# Patient Record
Sex: Male | Born: 1945 | Race: Black or African American | Hispanic: No | Marital: Married | Smoking: Never smoker
Health system: Southern US, Community
[De-identification: ages and names within clinical notes are randomized; demographics above are authoritative.]

## PROBLEM LIST (undated history)

## (undated) DIAGNOSIS — I639 Cerebral infarction, unspecified: Secondary | ICD-10-CM

## (undated) DIAGNOSIS — H04123 Dry eye syndrome of bilateral lacrimal glands: Secondary | ICD-10-CM

## (undated) DIAGNOSIS — H269 Unspecified cataract: Secondary | ICD-10-CM

## (undated) DIAGNOSIS — E119 Type 2 diabetes mellitus without complications: Secondary | ICD-10-CM

## (undated) DIAGNOSIS — E785 Hyperlipidemia, unspecified: Secondary | ICD-10-CM

## (undated) DIAGNOSIS — I10 Essential (primary) hypertension: Secondary | ICD-10-CM

## (undated) DIAGNOSIS — H31002 Unspecified chorioretinal scars, left eye: Secondary | ICD-10-CM

## (undated) DIAGNOSIS — H409 Unspecified glaucoma: Secondary | ICD-10-CM

## (undated) HISTORY — DX: Unspecified glaucoma: H40.9

## (undated) HISTORY — DX: Essential (primary) hypertension: I10

## (undated) HISTORY — DX: Type 2 diabetes mellitus without complications: E11.9

## (undated) HISTORY — DX: Dry eye syndrome of bilateral lacrimal glands: H04.123

## (undated) HISTORY — DX: Unspecified chorioretinal scars, left eye: H31.002

## (undated) HISTORY — DX: Hyperlipidemia, unspecified: E78.5

## (undated) HISTORY — DX: Unspecified cataract: H26.9

## (undated) HISTORY — DX: Cerebral infarction, unspecified: I63.9

---

## 2011-03-17 ENCOUNTER — Other Ambulatory Visit (HOSPITAL_COMMUNITY): Payer: Self-pay | Admitting: General Surgery

## 2011-03-17 DIAGNOSIS — C61 Malignant neoplasm of prostate: Secondary | ICD-10-CM

## 2011-03-21 ENCOUNTER — Other Ambulatory Visit (HOSPITAL_COMMUNITY): Payer: Self-pay | Admitting: General Surgery

## 2011-03-21 ENCOUNTER — Other Ambulatory Visit: Payer: Self-pay

## 2011-03-21 ENCOUNTER — Ambulatory Visit (HOSPITAL_COMMUNITY)
Admission: RE | Admit: 2011-03-21 | Discharge: 2011-03-21 | Disposition: A | Discharge status: Home / Self Care | Payer: Medicare Other | Source: Ambulatory Visit | Attending: General Surgery | Admitting: General Surgery

## 2011-03-21 ENCOUNTER — Ambulatory Visit (HOSPITAL_COMMUNITY): Admission: RE | Admit: 2011-03-21 | Payer: Medicare Other | Source: Ambulatory Visit

## 2011-03-21 DIAGNOSIS — C61 Malignant neoplasm of prostate: Secondary | ICD-10-CM

## 2011-11-13 ENCOUNTER — Ambulatory Visit (INDEPENDENT_AMBULATORY_CARE_PROVIDER_SITE_OTHER): Payer: Medicare Other | Admitting: Emergency Medicine

## 2011-11-13 VITALS — BP 121/76 | HR 98 | Temp 97.8°F | Resp 18 | Ht 70.75 in | Wt 247.6 lb

## 2011-11-13 DIAGNOSIS — H4310 Vitreous hemorrhage, unspecified eye: Secondary | ICD-10-CM

## 2011-11-13 DIAGNOSIS — H4311 Vitreous hemorrhage, right eye: Secondary | ICD-10-CM

## 2011-11-13 NOTE — Progress Notes (Signed)
  Subjective:    Patient ID: Julian Tyler, male    DOB: April 23, 1946, 66 y.o.   MRN: [id number]  HPI patient with a complicated history of right eye problems. He has had retinal detachment vitreous bleed enters with onset of pain in his right eye since Monday. His vision has been distorted in the right and then this morning he noticed he could not see at all.    Review of Systems patient with a complicated right history as noted above.     Objective:   Physical Exam The right pupil is approximately 6 mm and fixed. There is evidence of bright red blood that is in the posterior chamber not as seen with  anterior hyphema. Funduscopic exam was not able to be performed to visualize the optic nerve.       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 Call placed to the patient's doctor further advice. I spoke with the retinal specialist on call. He is going to meet him at his office nongreen valuable load and evaluate this problem.

## 2015-06-16 DIAGNOSIS — C61 Malignant neoplasm of prostate: Secondary | ICD-10-CM | POA: Diagnosis not present

## 2015-06-16 DIAGNOSIS — R3129 Other microscopic hematuria: Secondary | ICD-10-CM | POA: Diagnosis not present

## 2015-07-02 DIAGNOSIS — E785 Hyperlipidemia, unspecified: Secondary | ICD-10-CM | POA: Diagnosis not present

## 2015-07-02 DIAGNOSIS — E669 Obesity, unspecified: Secondary | ICD-10-CM | POA: Diagnosis not present

## 2015-07-02 DIAGNOSIS — E119 Type 2 diabetes mellitus without complications: Secondary | ICD-10-CM | POA: Diagnosis not present

## 2015-07-02 DIAGNOSIS — N259 Disorder resulting from impaired renal tubular function, unspecified: Secondary | ICD-10-CM | POA: Diagnosis not present

## 2015-07-20 DIAGNOSIS — E1142 Type 2 diabetes mellitus with diabetic polyneuropathy: Secondary | ICD-10-CM | POA: Diagnosis not present

## 2015-07-20 DIAGNOSIS — B351 Tinea unguium: Secondary | ICD-10-CM | POA: Diagnosis not present

## 2015-07-20 DIAGNOSIS — M2020 Hallux rigidus, unspecified foot: Secondary | ICD-10-CM | POA: Diagnosis not present

## 2015-07-28 DIAGNOSIS — C61 Malignant neoplasm of prostate: Secondary | ICD-10-CM | POA: Diagnosis not present

## 2015-08-04 DIAGNOSIS — Z760 Encounter for issue of repeat prescription: Secondary | ICD-10-CM | POA: Diagnosis not present

## 2015-08-04 DIAGNOSIS — E119 Type 2 diabetes mellitus without complications: Secondary | ICD-10-CM | POA: Diagnosis not present

## 2015-09-17 DIAGNOSIS — E119 Type 2 diabetes mellitus without complications: Secondary | ICD-10-CM | POA: Diagnosis not present

## 2015-09-17 DIAGNOSIS — H31002 Unspecified chorioretinal scars, left eye: Secondary | ICD-10-CM | POA: Diagnosis not present

## 2015-09-17 DIAGNOSIS — H25013 Cortical age-related cataract, bilateral: Secondary | ICD-10-CM | POA: Diagnosis not present

## 2015-09-17 DIAGNOSIS — H401132 Primary open-angle glaucoma, bilateral, moderate stage: Secondary | ICD-10-CM | POA: Diagnosis not present

## 2015-09-17 DIAGNOSIS — H2513 Age-related nuclear cataract, bilateral: Secondary | ICD-10-CM | POA: Diagnosis not present

## 2015-11-27 DIAGNOSIS — I10 Essential (primary) hypertension: Secondary | ICD-10-CM | POA: Diagnosis not present

## 2015-11-27 DIAGNOSIS — E1165 Type 2 diabetes mellitus with hyperglycemia: Secondary | ICD-10-CM | POA: Diagnosis not present

## 2016-01-08 DIAGNOSIS — E1165 Type 2 diabetes mellitus with hyperglycemia: Secondary | ICD-10-CM | POA: Diagnosis not present

## 2016-02-04 DIAGNOSIS — R6520 Severe sepsis without septic shock: Secondary | ICD-10-CM | POA: Diagnosis not present

## 2016-02-04 DIAGNOSIS — R030 Elevated blood-pressure reading, without diagnosis of hypertension: Secondary | ICD-10-CM | POA: Diagnosis not present

## 2016-02-04 DIAGNOSIS — R0602 Shortness of breath: Secondary | ICD-10-CM | POA: Diagnosis not present

## 2016-02-04 DIAGNOSIS — E871 Hypo-osmolality and hyponatremia: Secondary | ICD-10-CM | POA: Diagnosis not present

## 2016-02-04 DIAGNOSIS — E785 Hyperlipidemia, unspecified: Secondary | ICD-10-CM | POA: Diagnosis not present

## 2016-02-04 DIAGNOSIS — G8929 Other chronic pain: Secondary | ICD-10-CM | POA: Diagnosis not present

## 2016-02-04 DIAGNOSIS — M25521 Pain in right elbow: Secondary | ICD-10-CM | POA: Diagnosis not present

## 2016-02-04 DIAGNOSIS — N179 Acute kidney failure, unspecified: Secondary | ICD-10-CM | POA: Diagnosis not present

## 2016-02-04 DIAGNOSIS — J9811 Atelectasis: Secondary | ICD-10-CM | POA: Diagnosis not present

## 2016-02-04 DIAGNOSIS — M25572 Pain in left ankle and joints of left foot: Secondary | ICD-10-CM | POA: Diagnosis not present

## 2016-02-04 DIAGNOSIS — A419 Sepsis, unspecified organism: Secondary | ICD-10-CM | POA: Diagnosis not present

## 2016-02-04 DIAGNOSIS — I10 Essential (primary) hypertension: Secondary | ICD-10-CM | POA: Diagnosis not present

## 2016-02-04 DIAGNOSIS — R930 Abnormal findings on diagnostic imaging of skull and head, not elsewhere classified: Secondary | ICD-10-CM | POA: Diagnosis not present

## 2016-02-04 DIAGNOSIS — Z794 Long term (current) use of insulin: Secondary | ICD-10-CM | POA: Diagnosis not present

## 2016-02-04 DIAGNOSIS — E870 Hyperosmolality and hypernatremia: Secondary | ICD-10-CM | POA: Diagnosis not present

## 2016-02-04 DIAGNOSIS — N390 Urinary tract infection, site not specified: Secondary | ICD-10-CM | POA: Diagnosis not present

## 2016-02-04 DIAGNOSIS — K5900 Constipation, unspecified: Secondary | ICD-10-CM | POA: Diagnosis not present

## 2016-02-04 DIAGNOSIS — M25571 Pain in right ankle and joints of right foot: Secondary | ICD-10-CM | POA: Diagnosis not present

## 2016-02-04 DIAGNOSIS — M532X7 Spinal instabilities, lumbosacral region: Secondary | ICD-10-CM | POA: Diagnosis not present

## 2016-02-04 DIAGNOSIS — N10 Acute tubulo-interstitial nephritis: Secondary | ICD-10-CM | POA: Diagnosis not present

## 2016-02-04 DIAGNOSIS — K5660 Unspecified intestinal obstruction: Secondary | ICD-10-CM | POA: Diagnosis not present

## 2016-02-04 DIAGNOSIS — D649 Anemia, unspecified: Secondary | ICD-10-CM | POA: Diagnosis not present

## 2016-02-04 DIAGNOSIS — I69351 Hemiplegia and hemiparesis following cerebral infarction affecting right dominant side: Secondary | ICD-10-CM | POA: Diagnosis not present

## 2016-02-04 DIAGNOSIS — I2510 Atherosclerotic heart disease of native coronary artery without angina pectoris: Secondary | ICD-10-CM | POA: Diagnosis not present

## 2016-02-04 DIAGNOSIS — E872 Acidosis: Secondary | ICD-10-CM | POA: Diagnosis not present

## 2016-02-04 DIAGNOSIS — E876 Hypokalemia: Secondary | ICD-10-CM | POA: Diagnosis not present

## 2016-02-04 DIAGNOSIS — I69898 Other sequelae of other cerebrovascular disease: Secondary | ICD-10-CM | POA: Diagnosis not present

## 2016-02-04 DIAGNOSIS — K567 Ileus, unspecified: Secondary | ICD-10-CM | POA: Diagnosis not present

## 2016-02-04 DIAGNOSIS — E119 Type 2 diabetes mellitus without complications: Secondary | ICD-10-CM | POA: Diagnosis not present

## 2016-02-17 DIAGNOSIS — K567 Ileus, unspecified: Secondary | ICD-10-CM | POA: Diagnosis not present

## 2016-02-17 DIAGNOSIS — E876 Hypokalemia: Secondary | ICD-10-CM | POA: Diagnosis not present

## 2016-02-17 DIAGNOSIS — S90529A Blister (nonthermal), unspecified ankle, initial encounter: Secondary | ICD-10-CM | POA: Diagnosis not present

## 2016-02-17 DIAGNOSIS — N390 Urinary tract infection, site not specified: Secondary | ICD-10-CM | POA: Diagnosis not present

## 2016-02-17 DIAGNOSIS — I10 Essential (primary) hypertension: Secondary | ICD-10-CM | POA: Diagnosis not present

## 2016-02-17 DIAGNOSIS — I2510 Atherosclerotic heart disease of native coronary artery without angina pectoris: Secondary | ICD-10-CM | POA: Diagnosis not present

## 2016-02-17 DIAGNOSIS — R6520 Severe sepsis without septic shock: Secondary | ICD-10-CM | POA: Diagnosis not present

## 2016-02-17 DIAGNOSIS — E785 Hyperlipidemia, unspecified: Secondary | ICD-10-CM | POA: Diagnosis not present

## 2016-02-17 DIAGNOSIS — E559 Vitamin D deficiency, unspecified: Secondary | ICD-10-CM | POA: Diagnosis not present

## 2016-02-17 DIAGNOSIS — A419 Sepsis, unspecified organism: Secondary | ICD-10-CM | POA: Diagnosis not present

## 2016-02-17 DIAGNOSIS — Z794 Long term (current) use of insulin: Secondary | ICD-10-CM | POA: Diagnosis not present

## 2016-02-17 DIAGNOSIS — E119 Type 2 diabetes mellitus without complications: Secondary | ICD-10-CM | POA: Diagnosis not present

## 2016-02-17 DIAGNOSIS — I69898 Other sequelae of other cerebrovascular disease: Secondary | ICD-10-CM | POA: Diagnosis not present

## 2016-02-17 DIAGNOSIS — E870 Hyperosmolality and hypernatremia: Secondary | ICD-10-CM | POA: Diagnosis not present

## 2016-02-26 DIAGNOSIS — I2510 Atherosclerotic heart disease of native coronary artery without angina pectoris: Secondary | ICD-10-CM | POA: Diagnosis not present

## 2016-02-26 DIAGNOSIS — E119 Type 2 diabetes mellitus without complications: Secondary | ICD-10-CM | POA: Diagnosis not present

## 2016-02-26 DIAGNOSIS — I10 Essential (primary) hypertension: Secondary | ICD-10-CM | POA: Diagnosis not present

## 2016-02-26 DIAGNOSIS — S90529A Blister (nonthermal), unspecified ankle, initial encounter: Secondary | ICD-10-CM | POA: Diagnosis not present

## 2016-03-07 DIAGNOSIS — I10 Essential (primary) hypertension: Secondary | ICD-10-CM | POA: Diagnosis not present

## 2016-03-07 DIAGNOSIS — E785 Hyperlipidemia, unspecified: Secondary | ICD-10-CM | POA: Diagnosis not present

## 2016-03-07 DIAGNOSIS — S90529A Blister (nonthermal), unspecified ankle, initial encounter: Secondary | ICD-10-CM | POA: Diagnosis not present

## 2016-03-07 DIAGNOSIS — E876 Hypokalemia: Secondary | ICD-10-CM | POA: Diagnosis not present

## 2016-03-14 DIAGNOSIS — I2510 Atherosclerotic heart disease of native coronary artery without angina pectoris: Secondary | ICD-10-CM | POA: Diagnosis not present

## 2016-03-14 DIAGNOSIS — Z794 Long term (current) use of insulin: Secondary | ICD-10-CM | POA: Diagnosis not present

## 2016-03-14 DIAGNOSIS — D649 Anemia, unspecified: Secondary | ICD-10-CM | POA: Diagnosis not present

## 2016-03-14 DIAGNOSIS — I10 Essential (primary) hypertension: Secondary | ICD-10-CM | POA: Diagnosis not present

## 2016-03-14 DIAGNOSIS — E785 Hyperlipidemia, unspecified: Secondary | ICD-10-CM | POA: Diagnosis not present

## 2016-03-14 DIAGNOSIS — E11621 Type 2 diabetes mellitus with foot ulcer: Secondary | ICD-10-CM | POA: Diagnosis not present

## 2016-03-14 DIAGNOSIS — L97421 Non-pressure chronic ulcer of left heel and midfoot limited to breakdown of skin: Secondary | ICD-10-CM | POA: Diagnosis not present

## 2016-03-14 DIAGNOSIS — I69351 Hemiplegia and hemiparesis following cerebral infarction affecting right dominant side: Secondary | ICD-10-CM | POA: Diagnosis not present

## 2016-03-14 DIAGNOSIS — Z87440 Personal history of urinary (tract) infections: Secondary | ICD-10-CM | POA: Diagnosis not present

## 2016-03-15 DIAGNOSIS — I69351 Hemiplegia and hemiparesis following cerebral infarction affecting right dominant side: Secondary | ICD-10-CM | POA: Diagnosis not present

## 2016-03-15 DIAGNOSIS — Z794 Long term (current) use of insulin: Secondary | ICD-10-CM | POA: Diagnosis not present

## 2016-03-15 DIAGNOSIS — Z87440 Personal history of urinary (tract) infections: Secondary | ICD-10-CM | POA: Diagnosis not present

## 2016-03-15 DIAGNOSIS — D649 Anemia, unspecified: Secondary | ICD-10-CM | POA: Diagnosis not present

## 2016-03-15 DIAGNOSIS — E11621 Type 2 diabetes mellitus with foot ulcer: Secondary | ICD-10-CM | POA: Diagnosis not present

## 2016-03-15 DIAGNOSIS — E785 Hyperlipidemia, unspecified: Secondary | ICD-10-CM | POA: Diagnosis not present

## 2016-03-15 DIAGNOSIS — I2510 Atherosclerotic heart disease of native coronary artery without angina pectoris: Secondary | ICD-10-CM | POA: Diagnosis not present

## 2016-03-15 DIAGNOSIS — L97421 Non-pressure chronic ulcer of left heel and midfoot limited to breakdown of skin: Secondary | ICD-10-CM | POA: Diagnosis not present

## 2016-03-15 DIAGNOSIS — I10 Essential (primary) hypertension: Secondary | ICD-10-CM | POA: Diagnosis not present

## 2016-03-23 DIAGNOSIS — L97421 Non-pressure chronic ulcer of left heel and midfoot limited to breakdown of skin: Secondary | ICD-10-CM | POA: Diagnosis not present

## 2016-03-23 DIAGNOSIS — I2510 Atherosclerotic heart disease of native coronary artery without angina pectoris: Secondary | ICD-10-CM | POA: Diagnosis not present

## 2016-03-23 DIAGNOSIS — I10 Essential (primary) hypertension: Secondary | ICD-10-CM | POA: Diagnosis not present

## 2016-03-23 DIAGNOSIS — E785 Hyperlipidemia, unspecified: Secondary | ICD-10-CM | POA: Diagnosis not present

## 2016-03-23 DIAGNOSIS — I69351 Hemiplegia and hemiparesis following cerebral infarction affecting right dominant side: Secondary | ICD-10-CM | POA: Diagnosis not present

## 2016-03-23 DIAGNOSIS — Z794 Long term (current) use of insulin: Secondary | ICD-10-CM | POA: Diagnosis not present

## 2016-03-23 DIAGNOSIS — D649 Anemia, unspecified: Secondary | ICD-10-CM | POA: Diagnosis not present

## 2016-03-23 DIAGNOSIS — E11621 Type 2 diabetes mellitus with foot ulcer: Secondary | ICD-10-CM | POA: Diagnosis not present

## 2016-03-23 DIAGNOSIS — Z87440 Personal history of urinary (tract) infections: Secondary | ICD-10-CM | POA: Diagnosis not present

## 2016-03-24 DIAGNOSIS — E11621 Type 2 diabetes mellitus with foot ulcer: Secondary | ICD-10-CM | POA: Diagnosis not present

## 2016-03-24 DIAGNOSIS — E785 Hyperlipidemia, unspecified: Secondary | ICD-10-CM | POA: Diagnosis not present

## 2016-03-24 DIAGNOSIS — Z794 Long term (current) use of insulin: Secondary | ICD-10-CM | POA: Diagnosis not present

## 2016-03-24 DIAGNOSIS — D649 Anemia, unspecified: Secondary | ICD-10-CM | POA: Diagnosis not present

## 2016-03-24 DIAGNOSIS — L97421 Non-pressure chronic ulcer of left heel and midfoot limited to breakdown of skin: Secondary | ICD-10-CM | POA: Diagnosis not present

## 2016-03-24 DIAGNOSIS — I69351 Hemiplegia and hemiparesis following cerebral infarction affecting right dominant side: Secondary | ICD-10-CM | POA: Diagnosis not present

## 2016-03-24 DIAGNOSIS — I10 Essential (primary) hypertension: Secondary | ICD-10-CM | POA: Diagnosis not present

## 2016-03-24 DIAGNOSIS — I2510 Atherosclerotic heart disease of native coronary artery without angina pectoris: Secondary | ICD-10-CM | POA: Diagnosis not present

## 2016-03-24 DIAGNOSIS — Z87440 Personal history of urinary (tract) infections: Secondary | ICD-10-CM | POA: Diagnosis not present

## 2016-03-25 DIAGNOSIS — L97421 Non-pressure chronic ulcer of left heel and midfoot limited to breakdown of skin: Secondary | ICD-10-CM | POA: Diagnosis not present

## 2016-03-25 DIAGNOSIS — Z794 Long term (current) use of insulin: Secondary | ICD-10-CM | POA: Diagnosis not present

## 2016-03-25 DIAGNOSIS — E785 Hyperlipidemia, unspecified: Secondary | ICD-10-CM | POA: Diagnosis not present

## 2016-03-25 DIAGNOSIS — Z87440 Personal history of urinary (tract) infections: Secondary | ICD-10-CM | POA: Diagnosis not present

## 2016-03-25 DIAGNOSIS — E11621 Type 2 diabetes mellitus with foot ulcer: Secondary | ICD-10-CM | POA: Diagnosis not present

## 2016-03-25 DIAGNOSIS — I2510 Atherosclerotic heart disease of native coronary artery without angina pectoris: Secondary | ICD-10-CM | POA: Diagnosis not present

## 2016-03-25 DIAGNOSIS — I10 Essential (primary) hypertension: Secondary | ICD-10-CM | POA: Diagnosis not present

## 2016-03-25 DIAGNOSIS — D649 Anemia, unspecified: Secondary | ICD-10-CM | POA: Diagnosis not present

## 2016-03-25 DIAGNOSIS — I69351 Hemiplegia and hemiparesis following cerebral infarction affecting right dominant side: Secondary | ICD-10-CM | POA: Diagnosis not present

## 2016-03-28 DIAGNOSIS — E785 Hyperlipidemia, unspecified: Secondary | ICD-10-CM | POA: Diagnosis not present

## 2016-03-28 DIAGNOSIS — I10 Essential (primary) hypertension: Secondary | ICD-10-CM | POA: Diagnosis not present

## 2016-03-28 DIAGNOSIS — I2510 Atherosclerotic heart disease of native coronary artery without angina pectoris: Secondary | ICD-10-CM | POA: Diagnosis not present

## 2016-03-28 DIAGNOSIS — E11621 Type 2 diabetes mellitus with foot ulcer: Secondary | ICD-10-CM | POA: Diagnosis not present

## 2016-03-28 DIAGNOSIS — Z794 Long term (current) use of insulin: Secondary | ICD-10-CM | POA: Diagnosis not present

## 2016-03-28 DIAGNOSIS — Z87440 Personal history of urinary (tract) infections: Secondary | ICD-10-CM | POA: Diagnosis not present

## 2016-03-28 DIAGNOSIS — I69351 Hemiplegia and hemiparesis following cerebral infarction affecting right dominant side: Secondary | ICD-10-CM | POA: Diagnosis not present

## 2016-03-28 DIAGNOSIS — D649 Anemia, unspecified: Secondary | ICD-10-CM | POA: Diagnosis not present

## 2016-03-28 DIAGNOSIS — L97421 Non-pressure chronic ulcer of left heel and midfoot limited to breakdown of skin: Secondary | ICD-10-CM | POA: Diagnosis not present

## 2016-03-30 DIAGNOSIS — Z794 Long term (current) use of insulin: Secondary | ICD-10-CM | POA: Diagnosis not present

## 2016-03-30 DIAGNOSIS — Z87440 Personal history of urinary (tract) infections: Secondary | ICD-10-CM | POA: Diagnosis not present

## 2016-03-30 DIAGNOSIS — E785 Hyperlipidemia, unspecified: Secondary | ICD-10-CM | POA: Diagnosis not present

## 2016-03-30 DIAGNOSIS — L97421 Non-pressure chronic ulcer of left heel and midfoot limited to breakdown of skin: Secondary | ICD-10-CM | POA: Diagnosis not present

## 2016-03-30 DIAGNOSIS — E11621 Type 2 diabetes mellitus with foot ulcer: Secondary | ICD-10-CM | POA: Diagnosis not present

## 2016-03-30 DIAGNOSIS — D649 Anemia, unspecified: Secondary | ICD-10-CM | POA: Diagnosis not present

## 2016-03-30 DIAGNOSIS — I10 Essential (primary) hypertension: Secondary | ICD-10-CM | POA: Diagnosis not present

## 2016-03-30 DIAGNOSIS — I69351 Hemiplegia and hemiparesis following cerebral infarction affecting right dominant side: Secondary | ICD-10-CM | POA: Diagnosis not present

## 2016-03-30 DIAGNOSIS — I2510 Atherosclerotic heart disease of native coronary artery without angina pectoris: Secondary | ICD-10-CM | POA: Diagnosis not present

## 2016-04-01 DIAGNOSIS — I2510 Atherosclerotic heart disease of native coronary artery without angina pectoris: Secondary | ICD-10-CM | POA: Diagnosis not present

## 2016-04-01 DIAGNOSIS — E785 Hyperlipidemia, unspecified: Secondary | ICD-10-CM | POA: Diagnosis not present

## 2016-04-01 DIAGNOSIS — I69351 Hemiplegia and hemiparesis following cerebral infarction affecting right dominant side: Secondary | ICD-10-CM | POA: Diagnosis not present

## 2016-04-01 DIAGNOSIS — Z87440 Personal history of urinary (tract) infections: Secondary | ICD-10-CM | POA: Diagnosis not present

## 2016-04-01 DIAGNOSIS — L97421 Non-pressure chronic ulcer of left heel and midfoot limited to breakdown of skin: Secondary | ICD-10-CM | POA: Diagnosis not present

## 2016-04-01 DIAGNOSIS — I10 Essential (primary) hypertension: Secondary | ICD-10-CM | POA: Diagnosis not present

## 2016-04-01 DIAGNOSIS — Z794 Long term (current) use of insulin: Secondary | ICD-10-CM | POA: Diagnosis not present

## 2016-04-01 DIAGNOSIS — D649 Anemia, unspecified: Secondary | ICD-10-CM | POA: Diagnosis not present

## 2016-04-01 DIAGNOSIS — E11621 Type 2 diabetes mellitus with foot ulcer: Secondary | ICD-10-CM | POA: Diagnosis not present

## 2016-04-04 DIAGNOSIS — I2510 Atherosclerotic heart disease of native coronary artery without angina pectoris: Secondary | ICD-10-CM | POA: Diagnosis not present

## 2016-04-04 DIAGNOSIS — I69351 Hemiplegia and hemiparesis following cerebral infarction affecting right dominant side: Secondary | ICD-10-CM | POA: Diagnosis not present

## 2016-04-04 DIAGNOSIS — Z794 Long term (current) use of insulin: Secondary | ICD-10-CM | POA: Diagnosis not present

## 2016-04-04 DIAGNOSIS — D649 Anemia, unspecified: Secondary | ICD-10-CM | POA: Diagnosis not present

## 2016-04-04 DIAGNOSIS — Z87440 Personal history of urinary (tract) infections: Secondary | ICD-10-CM | POA: Diagnosis not present

## 2016-04-04 DIAGNOSIS — I10 Essential (primary) hypertension: Secondary | ICD-10-CM | POA: Diagnosis not present

## 2016-04-04 DIAGNOSIS — L97421 Non-pressure chronic ulcer of left heel and midfoot limited to breakdown of skin: Secondary | ICD-10-CM | POA: Diagnosis not present

## 2016-04-04 DIAGNOSIS — E785 Hyperlipidemia, unspecified: Secondary | ICD-10-CM | POA: Diagnosis not present

## 2016-04-04 DIAGNOSIS — E11621 Type 2 diabetes mellitus with foot ulcer: Secondary | ICD-10-CM | POA: Diagnosis not present

## 2016-04-06 DIAGNOSIS — I2510 Atherosclerotic heart disease of native coronary artery without angina pectoris: Secondary | ICD-10-CM | POA: Diagnosis not present

## 2016-04-06 DIAGNOSIS — Z794 Long term (current) use of insulin: Secondary | ICD-10-CM | POA: Diagnosis not present

## 2016-04-06 DIAGNOSIS — I10 Essential (primary) hypertension: Secondary | ICD-10-CM | POA: Diagnosis not present

## 2016-04-06 DIAGNOSIS — Z87440 Personal history of urinary (tract) infections: Secondary | ICD-10-CM | POA: Diagnosis not present

## 2016-04-06 DIAGNOSIS — E11621 Type 2 diabetes mellitus with foot ulcer: Secondary | ICD-10-CM | POA: Diagnosis not present

## 2016-04-06 DIAGNOSIS — L97421 Non-pressure chronic ulcer of left heel and midfoot limited to breakdown of skin: Secondary | ICD-10-CM | POA: Diagnosis not present

## 2016-04-06 DIAGNOSIS — E785 Hyperlipidemia, unspecified: Secondary | ICD-10-CM | POA: Diagnosis not present

## 2016-04-06 DIAGNOSIS — D649 Anemia, unspecified: Secondary | ICD-10-CM | POA: Diagnosis not present

## 2016-04-06 DIAGNOSIS — I69351 Hemiplegia and hemiparesis following cerebral infarction affecting right dominant side: Secondary | ICD-10-CM | POA: Diagnosis not present

## 2016-04-08 DIAGNOSIS — B351 Tinea unguium: Secondary | ICD-10-CM | POA: Diagnosis not present

## 2016-04-08 DIAGNOSIS — Z09 Encounter for follow-up examination after completed treatment for conditions other than malignant neoplasm: Secondary | ICD-10-CM | POA: Diagnosis not present

## 2016-04-08 DIAGNOSIS — E1165 Type 2 diabetes mellitus with hyperglycemia: Secondary | ICD-10-CM | POA: Diagnosis not present

## 2016-04-08 DIAGNOSIS — I10 Essential (primary) hypertension: Secondary | ICD-10-CM | POA: Diagnosis not present

## 2016-04-14 DIAGNOSIS — I69351 Hemiplegia and hemiparesis following cerebral infarction affecting right dominant side: Secondary | ICD-10-CM | POA: Diagnosis not present

## 2016-04-14 DIAGNOSIS — E1165 Type 2 diabetes mellitus with hyperglycemia: Secondary | ICD-10-CM | POA: Diagnosis not present

## 2016-04-14 DIAGNOSIS — Z87440 Personal history of urinary (tract) infections: Secondary | ICD-10-CM | POA: Diagnosis not present

## 2016-04-14 DIAGNOSIS — I2510 Atherosclerotic heart disease of native coronary artery without angina pectoris: Secondary | ICD-10-CM | POA: Diagnosis not present

## 2016-04-14 DIAGNOSIS — I10 Essential (primary) hypertension: Secondary | ICD-10-CM | POA: Diagnosis not present

## 2016-04-14 DIAGNOSIS — E785 Hyperlipidemia, unspecified: Secondary | ICD-10-CM | POA: Diagnosis not present

## 2016-04-14 DIAGNOSIS — D649 Anemia, unspecified: Secondary | ICD-10-CM | POA: Diagnosis not present

## 2016-04-14 DIAGNOSIS — E11621 Type 2 diabetes mellitus with foot ulcer: Secondary | ICD-10-CM | POA: Diagnosis not present

## 2016-04-14 DIAGNOSIS — L97421 Non-pressure chronic ulcer of left heel and midfoot limited to breakdown of skin: Secondary | ICD-10-CM | POA: Diagnosis not present

## 2016-04-14 DIAGNOSIS — E876 Hypokalemia: Secondary | ICD-10-CM | POA: Diagnosis not present

## 2016-04-14 DIAGNOSIS — Z794 Long term (current) use of insulin: Secondary | ICD-10-CM | POA: Diagnosis not present

## 2016-05-11 DIAGNOSIS — E7800 Pure hypercholesterolemia, unspecified: Secondary | ICD-10-CM | POA: Diagnosis not present

## 2016-05-11 DIAGNOSIS — R944 Abnormal results of kidney function studies: Secondary | ICD-10-CM | POA: Diagnosis not present

## 2016-05-11 DIAGNOSIS — E1165 Type 2 diabetes mellitus with hyperglycemia: Secondary | ICD-10-CM | POA: Diagnosis not present

## 2016-08-10 DIAGNOSIS — D649 Anemia, unspecified: Secondary | ICD-10-CM | POA: Diagnosis not present

## 2016-08-10 DIAGNOSIS — E1165 Type 2 diabetes mellitus with hyperglycemia: Secondary | ICD-10-CM | POA: Diagnosis not present

## 2016-08-10 DIAGNOSIS — M79604 Pain in right leg: Secondary | ICD-10-CM | POA: Diagnosis not present

## 2016-08-10 DIAGNOSIS — I10 Essential (primary) hypertension: Secondary | ICD-10-CM | POA: Diagnosis not present

## 2016-08-10 DIAGNOSIS — E7800 Pure hypercholesterolemia, unspecified: Secondary | ICD-10-CM | POA: Diagnosis not present

## 2016-08-17 DIAGNOSIS — B351 Tinea unguium: Secondary | ICD-10-CM | POA: Diagnosis not present

## 2016-08-17 DIAGNOSIS — M7551 Bursitis of right shoulder: Secondary | ICD-10-CM | POA: Diagnosis not present

## 2016-08-17 DIAGNOSIS — E119 Type 2 diabetes mellitus without complications: Secondary | ICD-10-CM | POA: Diagnosis not present

## 2016-08-17 DIAGNOSIS — M2021 Hallux rigidus, right foot: Secondary | ICD-10-CM | POA: Diagnosis not present

## 2016-11-09 DIAGNOSIS — Z0289 Encounter for other administrative examinations: Secondary | ICD-10-CM | POA: Diagnosis not present

## 2016-11-11 DIAGNOSIS — E785 Hyperlipidemia, unspecified: Secondary | ICD-10-CM | POA: Diagnosis not present

## 2016-11-11 DIAGNOSIS — I639 Cerebral infarction, unspecified: Secondary | ICD-10-CM | POA: Diagnosis not present

## 2016-11-11 DIAGNOSIS — I10 Essential (primary) hypertension: Secondary | ICD-10-CM | POA: Diagnosis not present

## 2016-11-11 DIAGNOSIS — E139 Other specified diabetes mellitus without complications: Secondary | ICD-10-CM | POA: Diagnosis not present

## 2016-11-14 DIAGNOSIS — Z131 Encounter for screening for diabetes mellitus: Secondary | ICD-10-CM | POA: Diagnosis not present

## 2016-11-25 DIAGNOSIS — I10 Essential (primary) hypertension: Secondary | ICD-10-CM | POA: Diagnosis not present

## 2016-12-02 DIAGNOSIS — M79604 Pain in right leg: Secondary | ICD-10-CM | POA: Diagnosis not present

## 2016-12-02 DIAGNOSIS — I69398 Other sequelae of cerebral infarction: Secondary | ICD-10-CM | POA: Diagnosis not present

## 2016-12-02 DIAGNOSIS — R2681 Unsteadiness on feet: Secondary | ICD-10-CM | POA: Diagnosis not present

## 2016-12-02 DIAGNOSIS — Z7409 Other reduced mobility: Secondary | ICD-10-CM | POA: Diagnosis not present

## 2016-12-07 DIAGNOSIS — R2681 Unsteadiness on feet: Secondary | ICD-10-CM | POA: Diagnosis not present

## 2016-12-07 DIAGNOSIS — M79604 Pain in right leg: Secondary | ICD-10-CM | POA: Diagnosis not present

## 2016-12-07 DIAGNOSIS — Z7409 Other reduced mobility: Secondary | ICD-10-CM | POA: Diagnosis not present

## 2016-12-07 DIAGNOSIS — I69398 Other sequelae of cerebral infarction: Secondary | ICD-10-CM | POA: Diagnosis not present

## 2016-12-09 DIAGNOSIS — I69398 Other sequelae of cerebral infarction: Secondary | ICD-10-CM | POA: Diagnosis not present

## 2016-12-09 DIAGNOSIS — M79604 Pain in right leg: Secondary | ICD-10-CM | POA: Diagnosis not present

## 2016-12-09 DIAGNOSIS — R2681 Unsteadiness on feet: Secondary | ICD-10-CM | POA: Diagnosis not present

## 2016-12-09 DIAGNOSIS — Z7409 Other reduced mobility: Secondary | ICD-10-CM | POA: Diagnosis not present

## 2016-12-13 DIAGNOSIS — Z7409 Other reduced mobility: Secondary | ICD-10-CM | POA: Diagnosis not present

## 2016-12-13 DIAGNOSIS — M79604 Pain in right leg: Secondary | ICD-10-CM | POA: Diagnosis not present

## 2016-12-13 DIAGNOSIS — I69398 Other sequelae of cerebral infarction: Secondary | ICD-10-CM | POA: Diagnosis not present

## 2016-12-13 DIAGNOSIS — R2681 Unsteadiness on feet: Secondary | ICD-10-CM | POA: Diagnosis not present

## 2016-12-13 DIAGNOSIS — H401132 Primary open-angle glaucoma, bilateral, moderate stage: Secondary | ICD-10-CM | POA: Diagnosis not present

## 2016-12-13 DIAGNOSIS — H524 Presbyopia: Secondary | ICD-10-CM | POA: Diagnosis not present

## 2016-12-13 DIAGNOSIS — E119 Type 2 diabetes mellitus without complications: Secondary | ICD-10-CM | POA: Diagnosis not present

## 2016-12-13 DIAGNOSIS — Z794 Long term (current) use of insulin: Secondary | ICD-10-CM | POA: Diagnosis not present

## 2016-12-13 DIAGNOSIS — H5203 Hypermetropia, bilateral: Secondary | ICD-10-CM | POA: Diagnosis not present

## 2016-12-15 DIAGNOSIS — M79604 Pain in right leg: Secondary | ICD-10-CM | POA: Diagnosis not present

## 2016-12-15 DIAGNOSIS — I69398 Other sequelae of cerebral infarction: Secondary | ICD-10-CM | POA: Diagnosis not present

## 2016-12-15 DIAGNOSIS — Z7409 Other reduced mobility: Secondary | ICD-10-CM | POA: Diagnosis not present

## 2016-12-15 DIAGNOSIS — R2681 Unsteadiness on feet: Secondary | ICD-10-CM | POA: Diagnosis not present

## 2016-12-21 DIAGNOSIS — M79604 Pain in right leg: Secondary | ICD-10-CM | POA: Diagnosis not present

## 2016-12-21 DIAGNOSIS — I69398 Other sequelae of cerebral infarction: Secondary | ICD-10-CM | POA: Diagnosis not present

## 2016-12-21 DIAGNOSIS — Z7409 Other reduced mobility: Secondary | ICD-10-CM | POA: Diagnosis not present

## 2016-12-21 DIAGNOSIS — R2681 Unsteadiness on feet: Secondary | ICD-10-CM | POA: Diagnosis not present

## 2016-12-23 DIAGNOSIS — R2681 Unsteadiness on feet: Secondary | ICD-10-CM | POA: Diagnosis not present

## 2016-12-23 DIAGNOSIS — Z7409 Other reduced mobility: Secondary | ICD-10-CM | POA: Diagnosis not present

## 2016-12-23 DIAGNOSIS — I69398 Other sequelae of cerebral infarction: Secondary | ICD-10-CM | POA: Diagnosis not present

## 2016-12-23 DIAGNOSIS — M79604 Pain in right leg: Secondary | ICD-10-CM | POA: Diagnosis not present

## 2016-12-28 DIAGNOSIS — R2681 Unsteadiness on feet: Secondary | ICD-10-CM | POA: Diagnosis not present

## 2016-12-28 DIAGNOSIS — Z7409 Other reduced mobility: Secondary | ICD-10-CM | POA: Diagnosis not present

## 2016-12-28 DIAGNOSIS — I69398 Other sequelae of cerebral infarction: Secondary | ICD-10-CM | POA: Diagnosis not present

## 2016-12-28 DIAGNOSIS — M79604 Pain in right leg: Secondary | ICD-10-CM | POA: Diagnosis not present

## 2017-01-04 DIAGNOSIS — I69398 Other sequelae of cerebral infarction: Secondary | ICD-10-CM | POA: Diagnosis not present

## 2017-01-04 DIAGNOSIS — M79604 Pain in right leg: Secondary | ICD-10-CM | POA: Diagnosis not present

## 2017-01-04 DIAGNOSIS — R2689 Other abnormalities of gait and mobility: Secondary | ICD-10-CM | POA: Diagnosis not present

## 2017-01-06 DIAGNOSIS — I69398 Other sequelae of cerebral infarction: Secondary | ICD-10-CM | POA: Diagnosis not present

## 2017-01-06 DIAGNOSIS — R2689 Other abnormalities of gait and mobility: Secondary | ICD-10-CM | POA: Diagnosis not present

## 2017-01-06 DIAGNOSIS — M79604 Pain in right leg: Secondary | ICD-10-CM | POA: Diagnosis not present

## 2017-01-08 DIAGNOSIS — I700 Atherosclerosis of aorta: Secondary | ICD-10-CM | POA: Diagnosis not present

## 2017-01-08 DIAGNOSIS — M25512 Pain in left shoulder: Secondary | ICD-10-CM | POA: Diagnosis not present

## 2017-01-08 DIAGNOSIS — M7552 Bursitis of left shoulder: Secondary | ICD-10-CM | POA: Diagnosis not present

## 2017-01-08 DIAGNOSIS — E119 Type 2 diabetes mellitus without complications: Secondary | ICD-10-CM | POA: Diagnosis not present

## 2017-01-08 DIAGNOSIS — Z8673 Personal history of transient ischemic attack (TIA), and cerebral infarction without residual deficits: Secondary | ICD-10-CM | POA: Diagnosis not present

## 2017-01-08 DIAGNOSIS — I10 Essential (primary) hypertension: Secondary | ICD-10-CM | POA: Diagnosis not present

## 2017-01-08 DIAGNOSIS — Z859 Personal history of malignant neoplasm, unspecified: Secondary | ICD-10-CM | POA: Diagnosis not present

## 2017-01-11 DIAGNOSIS — I69398 Other sequelae of cerebral infarction: Secondary | ICD-10-CM | POA: Diagnosis not present

## 2017-01-11 DIAGNOSIS — M79604 Pain in right leg: Secondary | ICD-10-CM | POA: Diagnosis not present

## 2017-01-11 DIAGNOSIS — R2689 Other abnormalities of gait and mobility: Secondary | ICD-10-CM | POA: Diagnosis not present

## 2017-01-13 DIAGNOSIS — I69398 Other sequelae of cerebral infarction: Secondary | ICD-10-CM | POA: Diagnosis not present

## 2017-01-13 DIAGNOSIS — R2689 Other abnormalities of gait and mobility: Secondary | ICD-10-CM | POA: Diagnosis not present

## 2017-01-13 DIAGNOSIS — M79604 Pain in right leg: Secondary | ICD-10-CM | POA: Diagnosis not present

## 2017-01-16 DIAGNOSIS — M25512 Pain in left shoulder: Secondary | ICD-10-CM | POA: Diagnosis not present

## 2017-01-16 DIAGNOSIS — M25532 Pain in left wrist: Secondary | ICD-10-CM | POA: Diagnosis not present

## 2017-01-16 DIAGNOSIS — M7552 Bursitis of left shoulder: Secondary | ICD-10-CM | POA: Diagnosis not present

## 2017-01-18 DIAGNOSIS — R2689 Other abnormalities of gait and mobility: Secondary | ICD-10-CM | POA: Diagnosis not present

## 2017-01-18 DIAGNOSIS — I69398 Other sequelae of cerebral infarction: Secondary | ICD-10-CM | POA: Diagnosis not present

## 2017-01-18 DIAGNOSIS — M79604 Pain in right leg: Secondary | ICD-10-CM | POA: Diagnosis not present

## 2017-01-20 DIAGNOSIS — I69398 Other sequelae of cerebral infarction: Secondary | ICD-10-CM | POA: Diagnosis not present

## 2017-01-20 DIAGNOSIS — M79604 Pain in right leg: Secondary | ICD-10-CM | POA: Diagnosis not present

## 2017-01-20 DIAGNOSIS — R2689 Other abnormalities of gait and mobility: Secondary | ICD-10-CM | POA: Diagnosis not present

## 2017-02-01 DIAGNOSIS — M7552 Bursitis of left shoulder: Secondary | ICD-10-CM | POA: Diagnosis not present

## 2017-02-01 DIAGNOSIS — M79604 Pain in right leg: Secondary | ICD-10-CM | POA: Diagnosis not present

## 2017-02-01 DIAGNOSIS — I69898 Other sequelae of other cerebrovascular disease: Secondary | ICD-10-CM | POA: Diagnosis not present

## 2017-02-01 DIAGNOSIS — R2681 Unsteadiness on feet: Secondary | ICD-10-CM | POA: Diagnosis not present

## 2017-02-10 DIAGNOSIS — I69898 Other sequelae of other cerebrovascular disease: Secondary | ICD-10-CM | POA: Diagnosis not present

## 2017-02-10 DIAGNOSIS — R2681 Unsteadiness on feet: Secondary | ICD-10-CM | POA: Diagnosis not present

## 2017-02-10 DIAGNOSIS — M7552 Bursitis of left shoulder: Secondary | ICD-10-CM | POA: Diagnosis not present

## 2017-02-10 DIAGNOSIS — M79604 Pain in right leg: Secondary | ICD-10-CM | POA: Diagnosis not present

## 2017-02-15 DIAGNOSIS — I69398 Other sequelae of cerebral infarction: Secondary | ICD-10-CM | POA: Diagnosis not present

## 2017-02-15 DIAGNOSIS — R269 Unspecified abnormalities of gait and mobility: Secondary | ICD-10-CM | POA: Diagnosis not present

## 2017-02-17 DIAGNOSIS — I69398 Other sequelae of cerebral infarction: Secondary | ICD-10-CM | POA: Diagnosis not present

## 2017-02-17 DIAGNOSIS — R269 Unspecified abnormalities of gait and mobility: Secondary | ICD-10-CM | POA: Diagnosis not present

## 2017-02-20 DIAGNOSIS — R269 Unspecified abnormalities of gait and mobility: Secondary | ICD-10-CM | POA: Diagnosis not present

## 2017-02-20 DIAGNOSIS — I69398 Other sequelae of cerebral infarction: Secondary | ICD-10-CM | POA: Diagnosis not present

## 2017-02-21 ENCOUNTER — Other Ambulatory Visit: Payer: Self-pay

## 2017-02-21 NOTE — Patient Outreach (Signed)
[facility]) Care Management  02/21/2017  Julian Tyler 28-Jun-1945 [id number]   Medication Adherence call to Julian Tyler the reason for this call is because Julian Tyler is showing past due under [place] health Care Ins.on his Lisinopil  20 mg and Pravastatin 40 mg he ask if we can call Julian Tyler and order his medication.I call [facility] and  order his medication I ask if they can fill it for 90 days supply because patient ask if they can fill a 90 days supply.[facility] said he had refill and they will have it ready for the patient.I call Julian Tyler back and told him that [facility] will have his medication ready for him.    [facility] Management Direct Dial [phone]  Fax [phone] [email or web address]

## 2017-02-24 DIAGNOSIS — I69398 Other sequelae of cerebral infarction: Secondary | ICD-10-CM | POA: Diagnosis not present

## 2017-02-24 DIAGNOSIS — R269 Unspecified abnormalities of gait and mobility: Secondary | ICD-10-CM | POA: Diagnosis not present

## 2017-02-27 DIAGNOSIS — R269 Unspecified abnormalities of gait and mobility: Secondary | ICD-10-CM | POA: Diagnosis not present

## 2017-02-27 DIAGNOSIS — I69398 Other sequelae of cerebral infarction: Secondary | ICD-10-CM | POA: Diagnosis not present

## 2017-03-03 DIAGNOSIS — I69398 Other sequelae of cerebral infarction: Secondary | ICD-10-CM | POA: Diagnosis not present

## 2017-03-03 DIAGNOSIS — E119 Type 2 diabetes mellitus without complications: Secondary | ICD-10-CM | POA: Diagnosis not present

## 2017-03-03 DIAGNOSIS — B351 Tinea unguium: Secondary | ICD-10-CM | POA: Diagnosis not present

## 2017-03-03 DIAGNOSIS — R269 Unspecified abnormalities of gait and mobility: Secondary | ICD-10-CM | POA: Diagnosis not present

## 2017-03-22 DIAGNOSIS — I639 Cerebral infarction, unspecified: Secondary | ICD-10-CM | POA: Diagnosis not present

## 2017-03-22 DIAGNOSIS — R944 Abnormal results of kidney function studies: Secondary | ICD-10-CM | POA: Diagnosis not present

## 2017-03-22 DIAGNOSIS — I10 Essential (primary) hypertension: Secondary | ICD-10-CM | POA: Diagnosis not present

## 2017-03-22 DIAGNOSIS — E876 Hypokalemia: Secondary | ICD-10-CM | POA: Diagnosis not present

## 2017-03-22 DIAGNOSIS — E119 Type 2 diabetes mellitus without complications: Secondary | ICD-10-CM | POA: Diagnosis not present

## 2017-06-19 DIAGNOSIS — I10 Essential (primary) hypertension: Secondary | ICD-10-CM | POA: Diagnosis not present

## 2017-06-19 DIAGNOSIS — D649 Anemia, unspecified: Secondary | ICD-10-CM | POA: Diagnosis not present

## 2017-06-19 DIAGNOSIS — N259 Disorder resulting from impaired renal tubular function, unspecified: Secondary | ICD-10-CM | POA: Diagnosis not present

## 2017-06-19 DIAGNOSIS — E109 Type 1 diabetes mellitus without complications: Secondary | ICD-10-CM | POA: Diagnosis not present

## 2017-06-19 DIAGNOSIS — Z8673 Personal history of transient ischemic attack (TIA), and cerebral infarction without residual deficits: Secondary | ICD-10-CM | POA: Diagnosis not present

## 2017-06-20 DIAGNOSIS — M25561 Pain in right knee: Secondary | ICD-10-CM | POA: Diagnosis not present

## 2017-06-20 DIAGNOSIS — M179 Osteoarthritis of knee, unspecified: Secondary | ICD-10-CM | POA: Diagnosis not present

## 2017-06-20 DIAGNOSIS — M25461 Effusion, right knee: Secondary | ICD-10-CM | POA: Diagnosis not present

## 2017-08-01 DIAGNOSIS — H401132 Primary open-angle glaucoma, bilateral, moderate stage: Secondary | ICD-10-CM | POA: Diagnosis not present

## 2017-08-28 DIAGNOSIS — E139 Other specified diabetes mellitus without complications: Secondary | ICD-10-CM | POA: Diagnosis not present

## 2017-08-28 DIAGNOSIS — Z8673 Personal history of transient ischemic attack (TIA), and cerebral infarction without residual deficits: Secondary | ICD-10-CM | POA: Diagnosis not present

## 2017-08-28 DIAGNOSIS — I10 Essential (primary) hypertension: Secondary | ICD-10-CM | POA: Diagnosis not present

## 2017-08-28 DIAGNOSIS — D649 Anemia, unspecified: Secondary | ICD-10-CM | POA: Diagnosis not present

## 2017-09-18 DIAGNOSIS — I10 Essential (primary) hypertension: Secondary | ICD-10-CM | POA: Diagnosis not present

## 2017-09-18 DIAGNOSIS — D649 Anemia, unspecified: Secondary | ICD-10-CM | POA: Diagnosis not present

## 2017-09-18 DIAGNOSIS — E119 Type 2 diabetes mellitus without complications: Secondary | ICD-10-CM | POA: Diagnosis not present

## 2017-09-25 DIAGNOSIS — D649 Anemia, unspecified: Secondary | ICD-10-CM | POA: Diagnosis not present

## 2017-09-26 DIAGNOSIS — E119 Type 2 diabetes mellitus without complications: Secondary | ICD-10-CM | POA: Diagnosis not present

## 2017-09-27 DIAGNOSIS — E119 Type 2 diabetes mellitus without complications: Secondary | ICD-10-CM | POA: Diagnosis not present

## 2017-09-27 DIAGNOSIS — M1711 Unilateral primary osteoarthritis, right knee: Secondary | ICD-10-CM | POA: Diagnosis not present

## 2017-09-28 DIAGNOSIS — M1711 Unilateral primary osteoarthritis, right knee: Secondary | ICD-10-CM | POA: Diagnosis not present

## 2017-11-30 DIAGNOSIS — H401132 Primary open-angle glaucoma, bilateral, moderate stage: Secondary | ICD-10-CM | POA: Diagnosis not present

## 2017-12-28 ENCOUNTER — Other Ambulatory Visit: Payer: Self-pay

## 2017-12-28 NOTE — Patient Outreach (Signed)
[facility]) Care Management  12/28/2017  Julian Tyler 08-30-1945 [id number]   Medication Adherence call to Mr. Julian Tyler left a message for patient to call back patient is due on Pravastatin 40 mg and Lisinopril 20 mg.Mr. Coate is showing past due under [place].  [place] Management Direct Dial [phone]  Fax [phone] [email or web address]

## 2018-01-10 DIAGNOSIS — E139 Other specified diabetes mellitus without complications: Secondary | ICD-10-CM | POA: Diagnosis not present

## 2018-01-10 DIAGNOSIS — D649 Anemia, unspecified: Secondary | ICD-10-CM | POA: Diagnosis not present

## 2018-01-10 DIAGNOSIS — I10 Essential (primary) hypertension: Secondary | ICD-10-CM | POA: Diagnosis not present

## 2018-01-10 DIAGNOSIS — N259 Disorder resulting from impaired renal tubular function, unspecified: Secondary | ICD-10-CM | POA: Diagnosis not present

## 2018-01-12 ENCOUNTER — Other Ambulatory Visit: Payer: Self-pay

## 2018-01-12 NOTE — Patient Outreach (Signed)
[facility]) Care Management  01/12/2018  Julian Tyler [birth year] [id number]   Medication Adherence call to Julian Tyler left a message for patient to call back patient is due on Pravastatin 40 mg and Lisinopril 20 mg. [name]  Is showing past due under [name].  [facility] Management Direct Dial [phone]  Fax [phone] [email or web address]

## 2018-11-22 ENCOUNTER — Other Ambulatory Visit: Payer: Self-pay | Admitting: Internal Medicine

## 2018-11-22 MED ORDER — LACOSAMIDE 100 MG PO TABS: 100.0000 mg | ORAL_TABLET | Freq: Two times a day (BID) | ORAL | 0 refills | Status: DC

## 2018-11-23 ENCOUNTER — Encounter: Payer: Self-pay | Admitting: Internal Medicine

## 2018-11-23 ENCOUNTER — Non-Acute Institutional Stay (SKILLED_NURSING_FACILITY): Payer: Medicare Other | Admitting: Internal Medicine

## 2018-11-23 DIAGNOSIS — E1169 Type 2 diabetes mellitus with other specified complication: Secondary | ICD-10-CM

## 2018-11-23 DIAGNOSIS — F0281 Dementia in other diseases classified elsewhere with behavioral disturbance: Secondary | ICD-10-CM

## 2018-11-23 DIAGNOSIS — R471 Dysarthria and anarthria: Secondary | ICD-10-CM

## 2018-11-23 DIAGNOSIS — R569 Unspecified convulsions: Secondary | ICD-10-CM

## 2018-11-23 DIAGNOSIS — E785 Hyperlipidemia, unspecified: Secondary | ICD-10-CM

## 2018-11-23 DIAGNOSIS — D508 Other iron deficiency anemias: Secondary | ICD-10-CM

## 2018-11-23 DIAGNOSIS — E1159 Type 2 diabetes mellitus with other circulatory complications: Secondary | ICD-10-CM

## 2018-11-23 DIAGNOSIS — R4701 Aphasia: Secondary | ICD-10-CM | POA: Diagnosis not present

## 2018-11-23 DIAGNOSIS — E1165 Type 2 diabetes mellitus with hyperglycemia: Secondary | ICD-10-CM

## 2018-11-23 DIAGNOSIS — I10 Essential (primary) hypertension: Secondary | ICD-10-CM

## 2018-11-23 DIAGNOSIS — R410 Disorientation, unspecified: Secondary | ICD-10-CM

## 2018-11-23 DIAGNOSIS — N179 Acute kidney failure, unspecified: Secondary | ICD-10-CM

## 2018-11-23 DIAGNOSIS — Z720 Tobacco use: Secondary | ICD-10-CM

## 2018-11-23 DIAGNOSIS — G300 Alzheimer's disease with early onset: Secondary | ICD-10-CM

## 2018-11-23 NOTE — Progress Notes (Signed)
: Provider: Noah Delaine. Pheobe Sandiford MD.  Location:  [facility] Room Number: 507-P Place of Service:  SNF ([phone])  PCP: Puschinsky, Fransico Him., MD Patient Care Team: Puschinsky, Fransico Him., MD as PCP - General (General Surgery)  Extended Emergency Contact Information Primary Emergency Contact: Matherly,Vivian Address: [address]          [place], [place] [address] [place] Phone: [phone] Relation: Spouse Secondary Emergency Contact: Doetsch,Vivian Address: [place]          [place], [place] [address] [place] Phone: [phone] Relation: Spouse     Allergies: Hctz [hydrochlorothiazide], Sulfa drugs cross reactors, Zithromax [azithromycin], and Robaxin [methocarbamol]  Chief Complaint  Patient presents with  . New Admit To SNF    New Admissiom Visit    HPI: Patient is 73 y.o. male with history of CVA affecting right side, hypertension, diabetes mellitus type 2 insulin-dependent, who presented to [facility] with strokelike symptoms.  Patient did have dysarthria and expressive a aphasia but able to understand and speak short sentences in the ER patient was noted to have facial twitching on the right side and got disoriented during that patient was admitted to [facility] from 5/22-6/11 for CVA and seizure work-up.  Patient had 2 focal seizures on 6/26 with facial twitching, right eye deviation and altered consciousness.  Vimpat was added and EEG on 5/26 was normal.  MRI of brain on 5/27 showed no acute stroke. Hospitalization was complicated by hospital induced delirium in the setting of underlying dementia patient's meds were initially Haldol changed to Risperdal and Ativan with improvement.  Patient's diabetes mellitus was uncontrolled with an A1c of 12.7.  Is not stated whether his diabetic meds were reworked.  Patient's blood pressure medicines were changed as patient had not had  clonidine patch for a week or more and metoprolol was added to his regimen.  And additionally an AKI likely prerenal dehydration resolved with IV fluids.  Patient is admitted to skilled nursing facility for OT/PT.  While at skilled nursing facility patient will be followed for tobacco abuse treated with NicoDerm patch, hyperlipidemia treated with Pravachol and anemia treated with iron.  History reviewed. No pertinent past medical history.  History reviewed. No pertinent surgical history.  Allergies as of 11/23/2018      Reactions   Hctz [hydrochlorothiazide] Swelling   Sulfa Drugs Cross Reactors Swelling   Zithromax [azithromycin] Diarrhea   Robaxin [methocarbamol]       Medication List       Accurate as of November 23, 2018 11:59 PM. If you have any questions, ask your nurse or doctor.        STOP taking these medications   atenolol 25 MG tablet Commonly known as: TENORMIN Stopped by: Inocencio Homes, MD   dabigatran 150 MG Caps capsule Commonly known as: PRADAXA Stopped by: Inocencio Homes, MD   diltiazem 180 MG 24 hr capsule Commonly known as: CARDIZEM CD Stopped by: Inocencio Homes, MD   HYDROcodone-acetaminophen 10-325 MG tablet Commonly known as: NORCO Stopped by: Inocencio Homes, MD   insulin detemir 100 UNIT/ML injection Commonly known as: LEVEMIR Stopped by: Inocencio Homes, MD   spironolactone 25 MG tablet Commonly known as: ALDACTONE Stopped by: Inocencio Homes, MD   tadalafil 5 MG tablet Commonly known as: CIALIS Stopped by: Inocencio Homes, MD   valsartan 320 MG tablet Commonly known as: DIOVAN Stopped by: Inocencio Homes, MD  TAKE these medications   amLODipine 10 MG tablet Commonly known as: NORVASC Take 10 mg by mouth daily.   aspirin EC 81 MG tablet Take 81 mg by mouth daily. What changed: Another medication with the same name was removed. Continue taking this medication, and follow the directions you see here. Changed by: Inocencio Homes, MD    bisacodyl 10 MG suppository Commonly known as: DULCOLAX If not relieved by MOM, give 10 mg Bisacodyl suppositiory rectally X 1 dose in 24 hours as needed (Do not use constipation standing orders for residents with renal failure/CFR less than 30. Contact MD for orders) (Physician Order)   cloNIDine 0.3 mg/24hr patch Commonly known as: CATAPRES - Dosed in mg/24 hr Place 1 patch onto the skin once a week last placed on 11/18/2018   [facility] Nicotine 21 mg/24hr patch Generic drug: nicotine Place 21 mg onto the skin daily. Place 1 patch onto skin daily Remove old patch prior to placement of new patch   dorzolamide 2 % ophthalmic solution Commonly known as: TRUSOPT Place 1 drop into both eyes 2 (two) times daily.   ferrous sulfate 325 (65 FE) MG tablet Take 325 mg by mouth daily with breakfast.   HumaLOG 100 UNIT/ML cartridge Generic drug: insulin lispro Inject 5 units into skin three times a day with meals   hydrALAZINE 100 MG tablet Commonly known as: APRESOLINE Take 100 mg by mouth 2 (two) times daily.   Lantus SoloStar 100 UNIT/ML Solostar Pen Generic drug: Insulin Glargine Inject 30 units SQ into skin nightly   levETIRAcetam 1000 MG tablet Commonly known as: KEPPRA Take 1,000 mg by mouth 2 (two) times daily.   lisinopril 20 MG tablet Commonly known as: ZESTRIL Take 20 mg by mouth daily.   magnesium hydroxide 400 MG/5ML suspension Commonly known as: MILK OF MAGNESIA If no BM in 3 days, give 30 cc Milk of Magnesium p.o. x 1 dose in 24 hours as needed (Do not use standing constipation orders for residents with renal failure CFR less than 30. Contact MD for orders) (Physician Order)   Melatonin 3 MG Tabs Give 1 tablet by mouth nightly as needed for sleep   metFORMIN 500 MG tablet Commonly known as: GLUCOPHAGE Take 500 mg by mouth daily with breakfast.   metoprolol succinate 25 MG 24 hr tablet Commonly known as: TOPROL-XL Take 25 mg by mouth daily. For HTN   potassium  chloride 10 MEQ tablet Commonly known as: K-DUR Take 10 mEq by mouth daily.   pravastatin 40 MG tablet Commonly known as: PRAVACHOL Take 40 mg by mouth daily.   RA SALINE ENEMA RE If not relieved by Biscodyl suppository, give disposable Saline Enema rectally X 1 dose/24 hrs as needed (Do not use constipation standing orders for residents with renal failure/CFR less than 30. Contact MD for orders)(Physician Or   risperiDONE 0.5 MG tablet Commonly known as: RISPERDAL Take 0.5 mg by mouth every morning.   risperiDONE 1 MG tablet Commonly known as: RISPERDAL Take 1 mg by mouth at bedtime.   Vimpat 100 MG Tabs Generic drug: Lacosamide VIMPAT 100 MG TABLET: Give 1 tabet by mouth twice a day       No orders of the defined types were placed in this encounter.    There is no immunization history on file for this patient.  Social History   Tobacco Use  . Smoking status: Former Research scientist (life sciences)  . Smokeless tobacco: Never Used  Substance Use Topics  . Alcohol use: Not on  file    Family history is mom with diabetes mellitus  History reviewed. No pertinent family history.    Review of Systems  DATA OBTAINED: from patient, nurse GENERAL:  no fevers, fatigue, appetite changes SKIN: No itching, or rash EYES: No eye pain, redness, discharge EARS: No earache, tinnitus, change in hearing NOSE: No congestion, drainage or bleeding  MOUTH/THROAT: No mouth or tooth pain, No sore throat RESPIRATORY: No cough, wheezing, SOB CARDIAC: No chest pain, palpitations, lower extremity edema  GI: No abdominal pain, No N/V/D or constipation, No heartburn or reflux  GU: No dysuria, frequency or urgency, or incontinence  MUSCULOSKELETAL: No unrelieved bone/joint pain NEUROLOGIC: No headache, dizziness or focal weakness PSYCHIATRIC: No c/o anxiety or sadness   Vitals:   11/23/18 0926  BP: (!) 164/80  Pulse: 92  Resp: 18  Temp: 98.7 F (37.1 C)    SpO2 Readings from Last 1 Encounters:  No data  found for SpO2   There is no height or weight on file to calculate BMI.     Physical Exam  GENERAL APPEARANCE: Alert, conversant,  No acute distress.  SKIN: No diaphoresis rash HEAD: Normocephalic, atraumatic  EYES: Conjunctiva/lids clear. Pupils round, reactive. EOMs intact.  EARS: External exam WNL, canals clear. Hearing grossly normal.  NOSE: No deformity or discharge.  MOUTH/THROAT: Lips w/o lesions  RESPIRATORY: Breathing is even, unlabored. Lung sounds are clear   CARDIOVASCULAR: Heart RRR no murmurs, rubs or gallops. No peripheral edema.   GASTROINTESTINAL: Abdomen is soft, non-tender, not distended w/ normal bowel sounds. GENITOURINARY: Bladder non tender, not distended  MUSCULOSKELETAL: No abnormal joints or musculature NEUROLOGIC:  Cranial nerves 2-12 grossly intact. Moves all extremities  PSYCHIATRIC: Mood and affect appropriate to situation with dementia, no behavioral issues  Patient Active Problem List   Diagnosis Date Noted  . Dysarthria 11/25/2018  . Expressive aphasia 11/25/2018  . Partial seizures ([place]) 11/25/2018  . Delirium 11/25/2018  . Dementia with behavioral problem ([place]) 11/25/2018  . Diabetes mellitus type 2, uncontrolled ([place]) 11/25/2018  . Hypertension associated with diabetes ([place]) 11/25/2018  . Acute kidney injury ([place]) 11/25/2018  . Hyperlipidemia associated with type 2 diabetes mellitus ([place]) 11/25/2018  . Iron deficiency anemia 11/25/2018  . Tobacco abuse 11/25/2018      Labs reviewed: Basic Metabolic Panel: No results found for: NA, K, CL, CO2, GLUCOSE, BUN, CREATININE, CALCIUM, PROT, ALBUMIN, AST, ALT, ALKPHOS, BILITOT, GFRNONAA, GFRAA  No results for input(s): NA, K, CL, CO2, GLUCOSE, BUN, CREATININE, CALCIUM, MG, PHOS in the last 8760 hours. Liver Function Tests: No results for input(s): AST, ALT, ALKPHOS, BILITOT, PROT, ALBUMIN in the last 8760 hours. No results for input(s): LIPASE, AMYLASE in the last 8760 hours. No results for  input(s): AMMONIA in the last 8760 hours. CBC: No results for input(s): WBC, NEUTROABS, HGB, HCT, MCV, PLT in the last 8760 hours. Lipid No results for input(s): CHOL, HDL, LDLCALC, TRIG in the last 8760 hours.  Cardiac Enzymes: No results for input(s): CKTOTAL, CKMB, CKMBINDEX, TROPONINI in the last 8760 hours. BNP: No results for input(s): BNP in the last 8760 hours. No results found for: MICROALBUR No results found for: HGBA1C No results found for: TSH No results found for: VITAMINB12 No results found for: FOLATE No results found for: IRON, TIBC, FERRITIN  Imaging and Procedures obtained prior to SNF admission: No results found.   Not all labs, radiology exams or other studies done during hospitalization come through on my EPIC note; however they are reviewed by  me.    Assessment and Plan  Strokelike symptoms- dysarthria and expressive aphasia-resolved spontaneously MRI brain 5/27 no acute stroke patient continued on home ASA 81 mg daily  Partial seizures/seizure disorder- in the ED patient was noted to have right arm numbness and facial twitching.  He was loaded with Keppra and neurology was consulted.  Pyrtle had 2 focal seizures on 6/26 with facial twitching, rightward eye deviation and altered consciousness and Vimpat was added.  No cause of seizures clearly seen SNF- continue Keppra 100 mg twice daily and Vimpat 100 mg twice daily  Delirium/dementia- agitation mostly at nights; initially scheduled with Haldol which was changed to respite all with Ativan and Haldol as needed patient improved SNF continue Risperdal 0.5 mg every morning and 1 mg nightly  Diabetes mellitus type 2-uncontrolled with a blood sugar of 648 on admission and a hemoglobin A1c of 12.7; it is unclear from notes with the patient's insulin regimen and medication regimen were changed or not SNF- continue Lantus 30 units nightly and lispro 5 units with meals; metformin 500 mg every morning; will follow  blood sugars before meals and nightly and titrate accordingly  Hypertension- patient's home clonidine pills were changed to clonidine patch weekly last applied on 6/7 and metoprolol was added during the admission as well SNF- continue clonidine 0.3 mg patch once weekly along with metoprolol 25 mg daily Norvasc 10, hydralazine 100 twice daily and lisinopril 20 mg  Acute kidney injury-resolved with IV fluids; discharge creatinine 0.84 SNF- we will follow-up BMP  Hyperlipidemia SNF-not stated as uncontrolled; continue Pravachol 40 mg daily  Anemia SNF- no labs available; continue iron 325 mg daily  Nicotine abuse  SNF- continue NicoDerm CQ 21 mg every 24    Time spent greater than 45 minutes;> 50% of time with patient was spent reviewing records, labs, tests and studies, counseling and developing plan of care Hennie Duos, MD

## 2018-11-25 ENCOUNTER — Encounter: Payer: Self-pay | Admitting: Internal Medicine

## 2018-11-25 DIAGNOSIS — R569 Unspecified convulsions: Secondary | ICD-10-CM | POA: Insufficient documentation

## 2018-11-25 DIAGNOSIS — F0391 Unspecified dementia with behavioral disturbance: Secondary | ICD-10-CM | POA: Insufficient documentation

## 2018-11-25 DIAGNOSIS — IMO0002 Reserved for concepts with insufficient information to code with codable children: Secondary | ICD-10-CM | POA: Insufficient documentation

## 2018-11-25 DIAGNOSIS — R471 Dysarthria and anarthria: Secondary | ICD-10-CM | POA: Insufficient documentation

## 2018-11-25 DIAGNOSIS — F03918 Unspecified dementia, unspecified severity, with other behavioral disturbance: Secondary | ICD-10-CM | POA: Insufficient documentation

## 2018-11-25 DIAGNOSIS — E1159 Type 2 diabetes mellitus with other circulatory complications: Secondary | ICD-10-CM | POA: Insufficient documentation

## 2018-11-25 DIAGNOSIS — D509 Iron deficiency anemia, unspecified: Secondary | ICD-10-CM | POA: Insufficient documentation

## 2018-11-25 DIAGNOSIS — Z720 Tobacco use: Secondary | ICD-10-CM | POA: Insufficient documentation

## 2018-11-25 DIAGNOSIS — E1165 Type 2 diabetes mellitus with hyperglycemia: Secondary | ICD-10-CM | POA: Insufficient documentation

## 2018-11-25 DIAGNOSIS — R4701 Aphasia: Secondary | ICD-10-CM | POA: Insufficient documentation

## 2018-11-25 DIAGNOSIS — E1169 Type 2 diabetes mellitus with other specified complication: Secondary | ICD-10-CM | POA: Insufficient documentation

## 2018-11-25 DIAGNOSIS — R410 Disorientation, unspecified: Secondary | ICD-10-CM | POA: Insufficient documentation

## 2018-11-25 DIAGNOSIS — N179 Acute kidney failure, unspecified: Secondary | ICD-10-CM | POA: Insufficient documentation

## 2018-11-26 ENCOUNTER — Other Ambulatory Visit: Payer: Self-pay | Admitting: Internal Medicine

## 2018-11-26 MED ORDER — ALPRAZOLAM 0.5 MG PO TABS: 0.5000 mg | ORAL_TABLET | Freq: Two times a day (BID) | ORAL | 0 refills | Status: DC

## 2018-11-29 ENCOUNTER — Non-Acute Institutional Stay (SKILLED_NURSING_FACILITY): Payer: Medicare Other | Admitting: Internal Medicine

## 2018-11-29 DIAGNOSIS — R569 Unspecified convulsions: Secondary | ICD-10-CM

## 2018-11-29 DIAGNOSIS — G300 Alzheimer's disease with early onset: Secondary | ICD-10-CM

## 2018-11-29 DIAGNOSIS — Z710 Person encountering health services to consult on behalf of another person: Secondary | ICD-10-CM

## 2018-11-29 DIAGNOSIS — E1165 Type 2 diabetes mellitus with hyperglycemia: Secondary | ICD-10-CM | POA: Diagnosis not present

## 2018-11-29 DIAGNOSIS — F0281 Dementia in other diseases classified elsewhere with behavioral disturbance: Secondary | ICD-10-CM

## 2018-11-29 NOTE — Progress Notes (Signed)
Location:  [facility] Room Number: 507-P Place of Service:  SNF (31)  Puschinsky, Fransico Him., MD  Patient Care Team: Puschinsky, Fransico Him., MD as PCP - General (General Surgery)  Extended Emergency Contact Information Primary Emergency Contact: Avey,Vivian Address: [address]          [place], [place] [address] [place] Phone: [phone] Relation: Spouse Secondary Emergency Contact: Koop,Vivian Address: [place]          [place], [place] [address] [place] Phone: [phone] Relation: Spouse    Allergies: Hctz [hydrochlorothiazide], Sulfa drugs cross reactors, Zithromax [azithromycin], and Robaxin [methocarbamol]  Chief Complaint  Patient presents with  . Advanced Directive    Care Plan Meeting    HPI: Patient is a 73 y.o. male who Is being seen for a care plan meeting.  In attendance is Debbie from rehab, myself, and Alyse Low from social work.  We are speaking with Mr. Domenic Polite son, via phone secondary to covered restrictions.  Past Medical History:  Diagnosis Date  . Cataract   . Chorioretinal scar of left eye   . Diabetes ([place])   . Dry eyes   . Glaucoma   . HLD (hyperlipidemia)   . HTN (hypertension)   . Stroke [facility])     History reviewed. No pertinent surgical history.  Allergies as of 11/29/2018      Reactions   Hctz [hydrochlorothiazide] Swelling   Sulfa Drugs Cross Reactors Swelling   Zithromax [azithromycin] Diarrhea   Robaxin [methocarbamol]       Medication List       Accurate as of November 29, 2018 11:59 PM. If you have any questions, ask your nurse or doctor.        STOP taking these medications   risperiDONE 0.5 MG tablet Commonly known as: RISPERDAL   risperiDONE 1 MG tablet Commonly known as: RISPERDAL     TAKE these medications   ALPRAZolam 0.5 MG tablet Commonly known as: Xanax Take 1 tablet (0.5 mg total) by mouth 2 (two) times daily.   amLODipine  10 MG tablet Commonly known as: NORVASC Take 10 mg by mouth daily.   aspirin EC 81 MG tablet Take 81 mg by mouth daily.   bisacodyl 10 MG suppository Commonly known as: DULCOLAX If not relieved by MOM, give 10 mg Bisacodyl suppositiory rectally X 1 dose in 24 hours as needed (Do not use constipation standing orders for residents with renal failure/CFR less than 30. Contact MD for orders) (Physician Order)   cloNIDine 0.3 mg/24hr patch Commonly known as: CATAPRES - Dosed in mg/24 hr Place 1 patch onto the skin once a week last placed on 11/18/2018   [facility] Nicotine 21 mg/24hr patch Generic drug: nicotine Place 21 mg onto the skin daily. Place 1 patch onto skin daily Remove old patch prior to placement of new patch   divalproex 250 MG DR tablet Commonly known as: DEPAKOTE Take 250 mg by mouth 3 (three) times daily.   dorzolamide 2 % ophthalmic solution Commonly known as: TRUSOPT Place 1 drop into both eyes 2 (two) times daily.   ferrous sulfate 325 (65 FE) MG tablet Take 325 mg by mouth daily with breakfast.   fluconazole 200 MG tablet Commonly known as: DIFLUCAN Take 200 mg by mouth daily.   HumaLOG 100 UNIT/ML cartridge Generic drug: insulin lispro Inject 5 units into skin three times a day with meals   hydrALAZINE 100 MG  tablet Commonly known as: APRESOLINE Take 100 mg by mouth 2 (two) times daily.   Lantus SoloStar 100 UNIT/ML Solostar Pen Generic drug: Insulin Glargine Inject 30 units SQ into skin nightly   levETIRAcetam 1000 MG tablet Commonly known as: KEPPRA Take 1,000 mg by mouth 2 (two) times daily.   lisinopril 20 MG tablet Commonly known as: ZESTRIL Take 20 mg by mouth daily.   magnesium hydroxide 400 MG/5ML suspension Commonly known as: MILK OF MAGNESIA If no BM in 3 days, give 30 cc Milk of Magnesium p.o. x 1 dose in 24 hours as needed (Do not use standing constipation orders for residents with renal failure CFR less than 30. Contact MD for orders)  (Physician Order)   Melatonin 3 MG Tabs Give 1 tablet by mouth nightly as needed for sleep   metFORMIN 500 MG tablet Commonly known as: GLUCOPHAGE Take 500 mg by mouth daily with breakfast.   metoprolol succinate 25 MG 24 hr tablet Commonly known as: TOPROL-XL Take 25 mg by mouth daily. For HTN   potassium chloride 10 MEQ tablet Commonly known as: K-DUR Take 10 mEq by mouth daily.   pravastatin 40 MG tablet Commonly known as: PRAVACHOL Take 40 mg by mouth daily.   QUEtiapine 50 MG tablet Commonly known as: SEROQUEL Take 50 mg by mouth every morning.   QUEtiapine 100 MG tablet Commonly known as: SEROQUEL Take 100 mg by mouth at bedtime.   RA SALINE ENEMA RE If not relieved by Biscodyl suppository, give disposable Saline Enema rectally X 1 dose/24 hrs as needed (Do not use constipation standing orders for residents with renal failure/CFR less than 30. Contact MD for orders)(Physician Or   Vimpat 100 MG Tabs Generic drug: Lacosamide VIMPAT 100 MG TABLET: Give 1 tabet by mouth twice a day       No orders of the defined types were placed in this encounter.    There is no immunization history on file for this patient.  Social History   Tobacco Use  . Smoking status: Never Smoker  . Smokeless tobacco: Current User    Types: Chew  Substance Use Topics  . Alcohol use: Yes    Comment: "some beer"    Review of Systems  DATA OBTAINED: from patient, nurse GENERAL:  no fevers, fatigue, appetite changes SKIN: No itching, rash HEENT: No complaint RESPIRATORY: No cough, wheezing, SOB CARDIAC: No chest pain, palpitations, lower extremity edema  GI: No abdominal pain, No N/V/D or constipation, No heartburn or reflux  GU: No dysuria, frequency or urgency, or incontinence  MUSCULOSKELETAL: No unrelieved bone/joint pain NEUROLOGIC: No headache, dizziness  PSYCHIATRIC: No overt anxiety or sadness  Vitals:   11/30/18 1612  BP: (!) 150/83  Pulse: 84  Resp: 18  Temp:  98 F (36.7 C)  SpO2: 96%   Body mass index is 39.99 kg/m. Physical Exam  GENERAL APPEARANCE: Alert, conversant, No acute distress  SKIN: No diaphoresis rash HEENT: Unremarkable RESPIRATORY: Breathing is even, unlabored. Lung sounds are clear   CARDIOVASCULAR: Heart RRR no murmurs, rubs or gallops. No peripheral edema  GASTROINTESTINAL: Abdomen is soft, non-tender, not distended w/ normal bowel sounds.  GENITOURINARY: Bladder non tender, not distended  MUSCULOSKELETAL: No abnormal joints or musculature NEUROLOGIC: Cranial nerves 2-12 grossly intact. Moves all extremities PSYCHIATRIC: Mood and affect appropriate to situation, + behavioral issues  Patient Active Problem List   Diagnosis Date Noted  . Encounter for family conference without patient present 12/01/2018  . Dysarthria 11/25/2018  .  Expressive aphasia 11/25/2018  . Partial seizures ([place]) 11/25/2018  . Delirium 11/25/2018  . Dementia with behavioral problem ([place]) 11/25/2018  . Diabetes mellitus type 2, uncontrolled ([place]) 11/25/2018  . Hypertension associated with diabetes ([place]) 11/25/2018  . Acute kidney injury ([place]) 11/25/2018  . Hyperlipidemia associated with type 2 diabetes mellitus ([place]) 11/25/2018  . Iron deficiency anemia 11/25/2018  . Tobacco abuse 11/25/2018    CMP  No results found for: NA, K, CL, CO2, GLUCOSE, BUN, CREATININE, CALCIUM, PROT, ALBUMIN, AST, ALT, ALKPHOS, BILITOT, GFRNONAA, GFRAA No results for input(s): NA, K, CL, CO2, GLUCOSE, BUN, CREATININE, CALCIUM, MG, PHOS in the last 8760 hours. No results for input(s): AST, ALT, ALKPHOS, BILITOT, PROT, ALBUMIN in the last 8760 hours. No results for input(s): WBC, NEUTROABS, HGB, HCT, MCV, PLT in the last 8760 hours. No results for input(s): CHOL, LDLCALC, TRIG in the last 8760 hours.  Invalid input(s): HCL No results found for: MICROALBUR No results found for: TSH No results found for: HGBA1C No results found for: CHOL, HDL, LDLCALC, LDLDIRECT,  TRIG, CHOLHDL  Significant Diagnostic Results in last 30 days:  No results found.  Assessment and Plan  Dementia with behavioral problem/partial seizures/diabetes mellitus type 2/encounter for family conference without patient present- after physical therapy presented their case which was that the patient was not working much, being distracted by the things going on  in his head.  Patient will need 24/7 support.  [name] patient's son assures [place] that this is not a problem there are 13 children plus grandchildren and they are all willing to help and I believe him, this being the second time that I have spoken with [name] at length about his dad.  It is expected that the patient will be "soon because he is not progressing in therapy and we told [name] medicine and was not perturbed in any way.  All questions asked and answered and difficult meeting was adjourned.     Time spent greater than 35 minutes Hennie Duos, MD

## 2018-11-30 ENCOUNTER — Encounter: Payer: Self-pay | Admitting: Internal Medicine

## 2018-12-01 ENCOUNTER — Encounter: Payer: Self-pay | Admitting: Internal Medicine

## 2018-12-01 DIAGNOSIS — Z7189 Other specified counseling: Secondary | ICD-10-CM | POA: Insufficient documentation

## 2018-12-11 ENCOUNTER — Emergency Department (HOSPITAL_COMMUNITY): Payer: Medicare Other

## 2018-12-11 ENCOUNTER — Inpatient Hospital Stay (HOSPITAL_COMMUNITY)
Admission: EM | Admit: 2018-12-11 | Discharge: 2019-01-24 | DRG: 640 | Disposition: A | Discharge status: Skilled Nursing Facility | Payer: Medicare Other | Source: Skilled Nursing Facility | Attending: Internal Medicine | Admitting: Internal Medicine

## 2018-12-11 ENCOUNTER — Observation Stay (HOSPITAL_COMMUNITY): Payer: Medicare Other

## 2018-12-11 ENCOUNTER — Non-Acute Institutional Stay (SKILLED_NURSING_FACILITY): Payer: Medicare Other | Admitting: Internal Medicine

## 2018-12-11 ENCOUNTER — Other Ambulatory Visit: Payer: Self-pay

## 2018-12-11 ENCOUNTER — Encounter: Payer: Self-pay | Admitting: Internal Medicine

## 2018-12-11 DIAGNOSIS — Z515 Encounter for palliative care: Secondary | ICD-10-CM

## 2018-12-11 DIAGNOSIS — Z79899 Other long term (current) drug therapy: Secondary | ICD-10-CM

## 2018-12-11 DIAGNOSIS — R569 Unspecified convulsions: Secondary | ICD-10-CM | POA: Diagnosis not present

## 2018-12-11 DIAGNOSIS — Z8673 Personal history of transient ischemic attack (TIA), and cerebral infarction without residual deficits: Secondary | ICD-10-CM | POA: Diagnosis not present

## 2018-12-11 DIAGNOSIS — F17220 Nicotine dependence, chewing tobacco, uncomplicated: Secondary | ICD-10-CM | POA: Diagnosis present

## 2018-12-11 DIAGNOSIS — E872 Acidosis, unspecified: Secondary | ICD-10-CM | POA: Diagnosis present

## 2018-12-11 DIAGNOSIS — Z7982 Long term (current) use of aspirin: Secondary | ICD-10-CM

## 2018-12-11 DIAGNOSIS — G40909 Epilepsy, unspecified, not intractable, without status epilepticus: Secondary | ICD-10-CM | POA: Diagnosis present

## 2018-12-11 DIAGNOSIS — R9401 Abnormal electroencephalogram [EEG]: Secondary | ICD-10-CM | POA: Diagnosis present

## 2018-12-11 DIAGNOSIS — IMO0002 Reserved for concepts with insufficient information to code with codable children: Secondary | ICD-10-CM | POA: Diagnosis present

## 2018-12-11 DIAGNOSIS — D509 Iron deficiency anemia, unspecified: Secondary | ICD-10-CM | POA: Diagnosis present

## 2018-12-11 DIAGNOSIS — E538 Deficiency of other specified B group vitamins: Secondary | ICD-10-CM | POA: Diagnosis present

## 2018-12-11 DIAGNOSIS — R0602 Shortness of breath: Secondary | ICD-10-CM

## 2018-12-11 DIAGNOSIS — R000 Tachycardia, unspecified: Secondary | ICD-10-CM | POA: Diagnosis not present

## 2018-12-11 DIAGNOSIS — E1165 Type 2 diabetes mellitus with hyperglycemia: Secondary | ICD-10-CM | POA: Diagnosis present

## 2018-12-11 DIAGNOSIS — Z4659 Encounter for fitting and adjustment of other gastrointestinal appliance and device: Secondary | ICD-10-CM

## 2018-12-11 DIAGNOSIS — R410 Disorientation, unspecified: Secondary | ICD-10-CM

## 2018-12-11 DIAGNOSIS — B965 Pseudomonas (aeruginosa) (mallei) (pseudomallei) as the cause of diseases classified elsewhere: Secondary | ICD-10-CM | POA: Diagnosis not present

## 2018-12-11 DIAGNOSIS — F0150 Vascular dementia without behavioral disturbance: Secondary | ICD-10-CM

## 2018-12-11 DIAGNOSIS — T17908A Unspecified foreign body in respiratory tract, part unspecified causing other injury, initial encounter: Secondary | ICD-10-CM

## 2018-12-11 DIAGNOSIS — E785 Hyperlipidemia, unspecified: Secondary | ICD-10-CM | POA: Diagnosis present

## 2018-12-11 DIAGNOSIS — G9341 Metabolic encephalopathy: Secondary | ICD-10-CM | POA: Diagnosis not present

## 2018-12-11 DIAGNOSIS — N390 Urinary tract infection, site not specified: Secondary | ICD-10-CM | POA: Diagnosis present

## 2018-12-11 DIAGNOSIS — I69351 Hemiplegia and hemiparesis following cerebral infarction affecting right dominant side: Secondary | ICD-10-CM

## 2018-12-11 DIAGNOSIS — N400 Enlarged prostate without lower urinary tract symptoms: Secondary | ICD-10-CM | POA: Diagnosis present

## 2018-12-11 DIAGNOSIS — R4182 Altered mental status, unspecified: Secondary | ICD-10-CM | POA: Diagnosis not present

## 2018-12-11 DIAGNOSIS — N179 Acute kidney failure, unspecified: Secondary | ICD-10-CM | POA: Diagnosis present

## 2018-12-11 DIAGNOSIS — I959 Hypotension, unspecified: Secondary | ICD-10-CM | POA: Diagnosis not present

## 2018-12-11 DIAGNOSIS — Y929 Unspecified place or not applicable: Secondary | ICD-10-CM

## 2018-12-11 DIAGNOSIS — Z7189 Other specified counseling: Secondary | ICD-10-CM

## 2018-12-11 DIAGNOSIS — R1310 Dysphagia, unspecified: Secondary | ICD-10-CM

## 2018-12-11 DIAGNOSIS — B9620 Unspecified Escherichia coli [E. coli] as the cause of diseases classified elsewhere: Secondary | ICD-10-CM | POA: Diagnosis not present

## 2018-12-11 DIAGNOSIS — L89322 Pressure ulcer of left buttock, stage 2: Secondary | ICD-10-CM | POA: Diagnosis not present

## 2018-12-11 DIAGNOSIS — Z781 Physical restraint status: Secondary | ICD-10-CM

## 2018-12-11 DIAGNOSIS — R471 Dysarthria and anarthria: Secondary | ICD-10-CM

## 2018-12-11 DIAGNOSIS — I358 Other nonrheumatic aortic valve disorders: Secondary | ICD-10-CM | POA: Diagnosis present

## 2018-12-11 DIAGNOSIS — Z79890 Hormone replacement therapy (postmenopausal): Secondary | ICD-10-CM

## 2018-12-11 DIAGNOSIS — R159 Full incontinence of feces: Secondary | ICD-10-CM | POA: Diagnosis not present

## 2018-12-11 DIAGNOSIS — Z6841 Body Mass Index (BMI) 40.0 and over, adult: Secondary | ICD-10-CM

## 2018-12-11 DIAGNOSIS — E860 Dehydration: Secondary | ICD-10-CM | POA: Diagnosis present

## 2018-12-11 DIAGNOSIS — F05 Delirium due to known physiological condition: Secondary | ICD-10-CM | POA: Diagnosis present

## 2018-12-11 DIAGNOSIS — D649 Anemia, unspecified: Secondary | ICD-10-CM

## 2018-12-11 DIAGNOSIS — L89619 Pressure ulcer of right heel, unspecified stage: Secondary | ICD-10-CM | POA: Diagnosis not present

## 2018-12-11 DIAGNOSIS — R627 Adult failure to thrive: Secondary | ICD-10-CM

## 2018-12-11 DIAGNOSIS — E870 Hyperosmolality and hypernatremia: Secondary | ICD-10-CM | POA: Diagnosis not present

## 2018-12-11 DIAGNOSIS — F0151 Vascular dementia with behavioral disturbance: Secondary | ICD-10-CM | POA: Diagnosis present

## 2018-12-11 DIAGNOSIS — I10 Essential (primary) hypertension: Secondary | ICD-10-CM | POA: Diagnosis present

## 2018-12-11 DIAGNOSIS — E119 Type 2 diabetes mellitus without complications: Secondary | ICD-10-CM

## 2018-12-11 DIAGNOSIS — H409 Unspecified glaucoma: Secondary | ICD-10-CM | POA: Diagnosis present

## 2018-12-11 DIAGNOSIS — Z931 Gastrostomy status: Secondary | ICD-10-CM

## 2018-12-11 DIAGNOSIS — R29810 Facial weakness: Secondary | ICD-10-CM | POA: Diagnosis present

## 2018-12-11 DIAGNOSIS — J9811 Atelectasis: Secondary | ICD-10-CM | POA: Diagnosis not present

## 2018-12-11 DIAGNOSIS — K5900 Constipation, unspecified: Secondary | ICD-10-CM

## 2018-12-11 DIAGNOSIS — R0603 Acute respiratory distress: Secondary | ICD-10-CM

## 2018-12-11 DIAGNOSIS — Z794 Long term (current) use of insulin: Secondary | ICD-10-CM

## 2018-12-11 DIAGNOSIS — R509 Fever, unspecified: Secondary | ICD-10-CM

## 2018-12-11 DIAGNOSIS — E43 Unspecified severe protein-calorie malnutrition: Secondary | ICD-10-CM

## 2018-12-11 DIAGNOSIS — L8990 Pressure ulcer of unspecified site, unspecified stage: Secondary | ICD-10-CM | POA: Insufficient documentation

## 2018-12-11 DIAGNOSIS — Z882 Allergy status to sulfonamides status: Secondary | ICD-10-CM

## 2018-12-11 DIAGNOSIS — I69322 Dysarthria following cerebral infarction: Secondary | ICD-10-CM

## 2018-12-11 DIAGNOSIS — H269 Unspecified cataract: Secondary | ICD-10-CM | POA: Diagnosis present

## 2018-12-11 DIAGNOSIS — T383X5A Adverse effect of insulin and oral hypoglycemic [antidiabetic] drugs, initial encounter: Secondary | ICD-10-CM | POA: Diagnosis present

## 2018-12-11 DIAGNOSIS — Z888 Allergy status to other drugs, medicaments and biological substances status: Secondary | ICD-10-CM

## 2018-12-11 DIAGNOSIS — Z20828 Contact with and (suspected) exposure to other viral communicable diseases: Secondary | ICD-10-CM | POA: Diagnosis present

## 2018-12-11 LAB — TROPONIN I (HIGH SENSITIVITY)
Troponin I (High Sensitivity): 12 ng/L (ref <18)
Troponin I (High Sensitivity): 12 ng/L (ref <18)

## 2018-12-11 LAB — CBC
HCT: 32.3 % — ABNORMAL LOW (ref 39.0–52.0)
Hemoglobin: 9.1 g/dL — ABNORMAL LOW (ref 13.0–17.0)
MCH: 23.4 pg — ABNORMAL LOW (ref 26.0–34.0)
MCHC: 28.2 g/dL — ABNORMAL LOW (ref 30.0–36.0)
MCV: 83 fL (ref 80.0–100.0)
Platelets: 264 10*3/uL (ref 150–400)
RBC: 3.89 MIL/uL — ABNORMAL LOW (ref 4.22–5.81)
RDW: 15.3 % (ref 11.5–15.5)
WBC: 14.2 10*3/uL — ABNORMAL HIGH (ref 4.0–10.5)
nRBC: 0 % (ref 0.0–0.2)

## 2018-12-11 LAB — I-STAT CHEM 8, ED
BUN: 67 mg/dL — ABNORMAL HIGH (ref 8–23)
BUN: 79 mg/dL — ABNORMAL HIGH (ref 8–23)
Calcium, Ion: 1.24 mmol/L (ref 1.15–1.40)
Calcium, Ion: 1.24 mmol/L (ref 1.15–1.40)
Chloride: 129 mmol/L — ABNORMAL HIGH (ref 98–111)
Chloride: 127 mmol/L — ABNORMAL HIGH (ref 98–111)
Creatinine, Ser: 1.5 mg/dL — ABNORMAL HIGH (ref 0.61–1.24)
Creatinine, Ser: 1.6 mg/dL — ABNORMAL HIGH (ref 0.61–1.24)
Glucose, Bld: 200 mg/dL — ABNORMAL HIGH (ref 70–99)
Glucose, Bld: 198 mg/dL — ABNORMAL HIGH (ref 70–99)
HCT: 34 % — ABNORMAL LOW (ref 39.0–52.0)
HCT: 30 % — ABNORMAL LOW (ref 39.0–52.0)
Hemoglobin: 11.6 g/dL — ABNORMAL LOW (ref 13.0–17.0)
Hemoglobin: 10.2 g/dL — ABNORMAL LOW (ref 13.0–17.0)
Potassium: 4.9 mmol/L (ref 3.5–5.1)
Potassium: 5.1 mmol/L (ref 3.5–5.1)
Sodium: 158 mmol/L — ABNORMAL HIGH (ref 135–145)
Sodium: 157 mmol/L — ABNORMAL HIGH (ref 135–145)
TCO2: 21 mmol/L — ABNORMAL LOW (ref 22–32)
TCO2: 24 mmol/L (ref 22–32)

## 2018-12-11 LAB — COMPREHENSIVE METABOLIC PANEL
ALT: 20 U/L (ref 0–44)
AST: 34 U/L (ref 15–41)
Albumin: 2.4 g/dL — ABNORMAL LOW (ref 3.5–5.0)
Alkaline Phosphatase: 53 U/L (ref 38–126)
Anion gap: 11 (ref 5–15)
BUN: 70 mg/dL — ABNORMAL HIGH (ref 8–23)
CO2: 19 mmol/L — ABNORMAL LOW (ref 22–32)
Calcium: 9.5 mg/dL (ref 8.9–10.3)
Chloride: 125 mmol/L — ABNORMAL HIGH (ref 98–111)
Creatinine, Ser: 1.83 mg/dL — ABNORMAL HIGH (ref 0.61–1.24)
GFR calc Af Amer: 42 mL/min — ABNORMAL LOW (ref >60)
GFR calc non Af Amer: 36 mL/min — ABNORMAL LOW (ref >60)
Glucose, Bld: 199 mg/dL — ABNORMAL HIGH (ref 70–99)
Potassium: 4.5 mmol/L (ref 3.5–5.1)
Sodium: 155 mmol/L — ABNORMAL HIGH (ref 135–145)
Total Bilirubin: 1 mg/dL (ref 0.3–1.2)
Total Protein: 8 g/dL (ref 6.5–8.1)

## 2018-12-11 LAB — CBG MONITORING, ED: Glucose-Capillary: 190 mg/dL — ABNORMAL HIGH (ref 70–99)

## 2018-12-11 LAB — SARS CORONAVIRUS 2 BY RT PCR (HOSPITAL ORDER, PERFORMED IN ~~LOC~~ HOSPITAL LAB): SARS Coronavirus 2: NEGATIVE

## 2018-12-11 LAB — APTT: aPTT: 30 seconds (ref 24–36)

## 2018-12-11 LAB — VALPROIC ACID LEVEL: Valproic Acid Lvl: <10 ug/mL — ABNORMAL LOW (ref 50.0–100.0)

## 2018-12-11 LAB — DIFFERENTIAL
Abs Immature Granulocytes: 0.07 10*3/uL (ref 0.00–0.07)
Basophils Absolute: 0.1 10*3/uL (ref 0.0–0.1)
Basophils Relative: 0 %
Eosinophils Absolute: 0 10*3/uL (ref 0.0–0.5)
Eosinophils Relative: 0 %
Immature Granulocytes: 1 %
Lymphocytes Relative: 11 %
Lymphs Abs: 1.6 10*3/uL (ref 0.7–4.0)
Monocytes Absolute: 1.6 10*3/uL — ABNORMAL HIGH (ref 0.1–1.0)
Monocytes Relative: 11 %
Neutro Abs: 10.9 10*3/uL — ABNORMAL HIGH (ref 1.7–7.7)
Neutrophils Relative %: 77 %

## 2018-12-11 LAB — PROTIME-INR
INR: 1.4 — ABNORMAL HIGH (ref 0.8–1.2)
Prothrombin Time: 17.4 seconds — ABNORMAL HIGH (ref 11.4–15.2)

## 2018-12-11 LAB — LACTIC ACID, PLASMA
Lactic Acid, Venous: 2.2 mmol/L (ref 0.5–1.9)
Lactic Acid, Venous: 2.3 mmol/L (ref 0.5–1.9)

## 2018-12-11 LAB — D-DIMER, QUANTITATIVE: D-Dimer, Quant: 6.1 ug/mL-FEU — ABNORMAL HIGH (ref 0.00–0.50)

## 2018-12-11 MED ORDER — SODIUM CHLORIDE 0.9 % IV BOLUS
1000.0000 mL | Freq: Once | INTRAVENOUS | Status: AC
  Administered 2018-12-11: 1000 mL via INTRAVENOUS

## 2018-12-11 MED ORDER — ACETAMINOPHEN 160 MG/5ML PO SOLN
650.0000 mg | ORAL | Status: DC | PRN
  Administered 2018-12-21 – 2019-01-22 (×10): 650 mg
  Filled 2018-12-11 (×10): qty 20.3

## 2018-12-11 MED ORDER — SODIUM CHLORIDE 0.45 % IV SOLN
INTRAVENOUS | Status: DC
  Administered 2018-12-11: 21:00:00 via INTRAVENOUS

## 2018-12-11 MED ORDER — ACETAMINOPHEN 325 MG PO TABS
650.0000 mg | ORAL_TABLET | ORAL | Status: DC | PRN
  Administered 2019-01-02 (×2): 650 mg via ORAL
  Filled 2018-12-11 (×2): qty 2

## 2018-12-11 MED ORDER — SODIUM CHLORIDE 0.9% FLUSH
3.0000 mL | Freq: Once | INTRAVENOUS | Status: AC
  Administered 2018-12-11: 3 mL via INTRAVENOUS

## 2018-12-11 MED ORDER — ASPIRIN 325 MG PO TABS
325.0000 mg | ORAL_TABLET | Freq: Every day | ORAL | Status: DC
  Administered 2018-12-17 – 2018-12-24 (×4): 325 mg via ORAL
  Filled 2018-12-11 (×6): qty 1

## 2018-12-11 MED ORDER — ENOXAPARIN SODIUM 30 MG/0.3ML ~~LOC~~ SOLN
30.0000 mg | SUBCUTANEOUS | Status: DC
  Administered 2018-12-12: 30 mg via SUBCUTANEOUS
  Filled 2018-12-11: qty 0.3

## 2018-12-11 MED ORDER — STROKE: EARLY STAGES OF RECOVERY BOOK
Freq: Once | Status: AC
  Administered 2018-12-12: 10:00:00
  Filled 2018-12-11: qty 1

## 2018-12-11 MED ORDER — IOHEXOL 350 MG/ML SOLN
100.0000 mL | Freq: Once | INTRAVENOUS | Status: AC | PRN
  Administered 2018-12-11: 100 mL via INTRAVENOUS

## 2018-12-11 MED ORDER — ASPIRIN 300 MG RE SUPP
300.0000 mg | Freq: Every day | RECTAL | Status: DC
  Administered 2018-12-11 – 2018-12-21 (×10): 300 mg via RECTAL
  Filled 2018-12-11 (×9): qty 1

## 2018-12-11 MED ORDER — ACETAMINOPHEN 650 MG RE SUPP
650.0000 mg | RECTAL | Status: DC | PRN
  Administered 2018-12-19: 13:00:00 650 mg via RECTAL
  Filled 2018-12-11 (×2): qty 1

## 2018-12-11 NOTE — ED Notes (Signed)
Attempted report 

## 2018-12-11 NOTE — Progress Notes (Signed)
EEG Complete  Results Pending 

## 2018-12-11 NOTE — ED Triage Notes (Signed)
Pt brought in by [facility] from [place] facility as a code stroke. Per facility pt has hx of stroke with right sided deficits and slurred speech at baseline. Pt last known normal at baseline is 1000 this am. Per facility pt is more lethargic and speech is more slurred. CBG 993, BP 716 systolic with fire on scene.

## 2018-12-11 NOTE — ED Provider Notes (Signed)
[facility] EMERGENCY DEPARTMENT Provider Note   CSN: [id number] Arrival date & time: 12/11/18  1513     History   Chief Complaint Chief Complaint  Patient presents with   Code Stroke    HPI Julian Tyler is a 73 y.o. male.  Level 5 caveat secondary to altered mental status.  73 year old male with a prior history of stroke and slurred speech right-sided deficits.  More recently was at [facility] with seizure and put on [place].  He has now had a long-term facility where he has had added Depakote Seroquel and Xanax for agitation.  Staff noticed this morning around 10 AM and the patient was more lethargic than normal with possibly new deficits of left arm weakness and left facial droop.  He arrives here as a stroke activation.  Patient is unable to give any history.     The history is provided by the EMS personnel and the nursing home.  Altered Mental Status Presenting symptoms: behavior changes and lethargy   Severity:  Unable to specify Most recent episode:  Today Timing:  Unable to specify Progression:  Unchanged Chronicity:  New Context: dementia, nursing home resident and recent change in medication     Past Medical History:  Diagnosis Date   Cataract    Chorioretinal scar of left eye    Diabetes ([place])    Dry eyes    Glaucoma    HLD (hyperlipidemia)    HTN (hypertension)    Stroke [facility])     Patient Active Problem List   Diagnosis Date Noted   Encounter for family conference without patient present 12/01/2018   Dysarthria 11/25/2018   Expressive aphasia 11/25/2018   Partial seizures ([place]) 11/25/2018   Delirium 11/25/2018   Dementia with behavioral problem ([place]) 11/25/2018   Diabetes mellitus type 2, uncontrolled ([place]) 11/25/2018   Hypertension associated with diabetes ([place]) 11/25/2018   Acute kidney injury ([place]) 11/25/2018   Hyperlipidemia associated with type 2 diabetes mellitus ([place]) 11/25/2018   Iron deficiency anemia  11/25/2018   Tobacco abuse 11/25/2018    No past surgical history on file.      Home Medications    Prior to Admission medications   Medication Sig Start Date End Date Taking? Authorizing Provider  ALPRAZolam Duanne Moron) 0.5 MG tablet Take 1 tablet (0.5 mg total) by mouth 2 (two) times daily. 11/26/18   Hennie Duos, MD  amLODipine (NORVASC) 10 MG tablet Take 10 mg by mouth daily.    [provider]  aspirin EC 81 MG tablet Take 81 mg by mouth daily.    [provider]  bisacodyl (DULCOLAX) 10 MG suppository If not relieved by MOM, give 10 mg Bisacodyl suppositiory rectally X 1 dose in 24 hours as needed (Do not use constipation standing orders for residents with renal failure/CFR less than 30. Contact MD for orders) (Physician Order)    [provider]  cloNIDine (CATAPRES - DOSED IN MG/24 HR) 0.3 mg/24hr patch Place 1 patch onto the skin once a week last placed on 11/18/2018    [provider]  divalproex (DEPAKOTE) 250 MG DR tablet Take 250 mg by mouth 3 (three) times daily.    [provider]  dorzolamide (TRUSOPT) 2 % ophthalmic solution Place 1 drop into both eyes 2 (two) times daily.    [provider]  ferrous sulfate 325 (65 FE) MG tablet Take 325 mg by mouth daily with breakfast.    [provider]  hydrALAZINE (APRESOLINE)  100 MG tablet Take 100 mg by mouth 2 (two) times daily.    [provider]  Insulin Glargine (LANTUS SOLOSTAR) 100 UNIT/ML Solostar Pen Inject 30 units SQ into skin nightly    [provider]  insulin lispro (HUMALOG) 100 UNIT/ML cartridge Inject 5 units into skin three times a day with meals    [provider]  Lacosamide (VIMPAT) 100 MG TABS VIMPAT 100 MG TABLET: Give 1 tabet by mouth twice a day    [provider]  levETIRAcetam (KEPPRA) 1000 MG tablet Take 1,000 mg by mouth 2 (two) times daily.    [provider]  lisinopril (ZESTRIL) 20 MG tablet Take  20 mg by mouth daily.    [provider]  magnesium hydroxide (MILK OF MAGNESIA) 400 MG/5ML suspension If no BM in 3 days, give 30 cc Milk of Magnesium p.o. x 1 dose in 24 hours as needed (Do not use standing constipation orders for residents with renal failure CFR less than 30. Contact MD for orders) (Physician Order)    [provider]  Melatonin 3 MG TABS Give 1 tablet by mouth nightly as needed for sleep    [provider]  metFORMIN (GLUCOPHAGE) 500 MG tablet Take 500 mg by mouth daily with breakfast.    [provider]  metoprolol succinate (TOPROL-XL) 25 MG 24 hr tablet Take 25 mg by mouth daily. For HTN    [provider]  nicotine ([employer] NICOTINE) 21 mg/24hr patch Place 21 mg onto the skin daily. Place 1 patch onto skin daily Remove old patch prior to placement of new patch    [provider]  potassium chloride (K-DUR) 10 MEQ tablet Take 10 mEq by mouth daily.    [provider]  pravastatin (PRAVACHOL) 40 MG tablet Take 40 mg by mouth daily.    [provider]  QUEtiapine (SEROQUEL) 100 MG tablet Take 100 mg by mouth at bedtime.    [provider]  QUEtiapine (SEROQUEL) 50 MG tablet Take 50 mg by mouth every morning.    [provider]  Sodium Phosphates (RA SALINE ENEMA RE) If not relieved by Biscodyl suppository, give disposable Saline Enema rectally X 1 dose/24 hrs as needed (Do not use constipation standing orders for residents with renal failure/CFR less than 30. Contact MD for orders)(Physician Or    [provider]    Family History No family history on file.  Social History Social History   Tobacco Use   Smoking status: Never Smoker   Smokeless tobacco: Current User    Types: Chew  Substance Use Topics   Alcohol use: Yes    Comment: "some beer"   Drug use: Not on file     Allergies   Hctz [hydrochlorothiazide], Sulfa drugs cross reactors, Zithromax [azithromycin], and  Robaxin [methocarbamol]   Review of Systems Review of Systems  Unable to perform ROS: Mental status change     Physical Exam Updated Vital Signs BP (!) 166/68 (BP Location: Right Arm)    Pulse (!) 118    Temp 98.2 F (36.8 C) (Axillary)    Resp 20    SpO2 96%   Physical Exam Vitals signs and nursing note reviewed.  Constitutional:      General: He is not in acute distress.    Appearance: He is well-developed.  HENT:     Head: Normocephalic and atraumatic.  Eyes:     Conjunctiva/sclera: Conjunctivae normal.  Neck:     Musculoskeletal: Neck  supple.  Cardiovascular:     Rate and Rhythm: Regular rhythm. Tachycardia present.     Heart sounds: No murmur.  Pulmonary:     Effort: Pulmonary effort is normal. No respiratory distress.     Breath sounds: Normal breath sounds.  Abdominal:     Palpations: Abdomen is soft.     Tenderness: There is no abdominal tenderness. There is no guarding.  Musculoskeletal:        General: No deformity.     Right lower leg: Edema present.     Left lower leg: Edema present.  Skin:    General: Skin is warm and dry.     Capillary Refill: Capillary refill takes less than 2 seconds.  Neurological:     GCS: GCS eye subscore is 4. GCS verbal subscore is 5. GCS motor subscore is 6.     Comments: Patient is awake but not alert.  He has some mumbling speech.  I do not appreciate a gross facial asymmetry.  He is not following any commands and not moving his extremities.      ED Treatments / Results  Labs (all labs ordered are listed, but only abnormal results are displayed) Labs Reviewed  PROTIME-INR - Abnormal; Notable for the following components:      Result Value   Prothrombin Time 17.4 (*)    INR 1.4 (*)    All other components within normal limits  CBC - Abnormal; Notable for the following components:   WBC 14.2 (*)    RBC 3.89 (*)    Hemoglobin 9.1 (*)    HCT 32.3 (*)    MCH 23.4 (*)    MCHC 28.2 (*)    All other components within  normal limits  DIFFERENTIAL - Abnormal; Notable for the following components:   Neutro Abs 10.9 (*)    Monocytes Absolute 1.6 (*)    All other components within normal limits  COMPREHENSIVE METABOLIC PANEL - Abnormal; Notable for the following components:   Sodium 155 (*)    Chloride 125 (*)    CO2 19 (*)    Glucose, Bld 199 (*)    BUN 70 (*)    Creatinine, Ser 1.83 (*)    Albumin 2.4 (*)    GFR calc non Af Amer 36 (*)    GFR calc Af Amer 42 (*)    All other components within normal limits  LACTIC ACID, PLASMA - Abnormal; Notable for the following components:   Lactic Acid, Venous 2.2 (*)    All other components within normal limits  LACTIC ACID, PLASMA - Abnormal; Notable for the following components:   Lactic Acid, Venous 2.3 (*)    All other components within normal limits  URINALYSIS, ROUTINE W REFLEX MICROSCOPIC - Abnormal; Notable for the following components:   Color, Urine AMBER (*)    APPearance HAZY (*)    Specific Gravity, Urine >1.046 (*)    Hgb urine dipstick SMALL (*)    Protein, ur 30 (*)    Leukocytes,Ua LARGE (*)    WBC, UA >50 (*)    Bacteria, UA RARE (*)    Non Squamous Epithelial 0-5 (*)    All other components within normal limits  VALPROIC ACID LEVEL - Abnormal; Notable for the following components:   Valproic Acid Lvl <10 (*)    All other components within normal limits  D-DIMER, QUANTITATIVE (NOT AT [facility]) - Abnormal; Notable for the following components:   D-Dimer, Quant 6.10 (*)    All  other components within normal limits  COMPREHENSIVE METABOLIC PANEL - Abnormal; Notable for the following components:   Sodium 157 (*)    Chloride 123 (*)    CO2 18 (*)    Glucose, Bld 227 (*)    BUN 64 (*)    Creatinine, Ser 1.63 (*)    Total Protein 8.3 (*)    Albumin 2.4 (*)    GFR calc non Af Amer 41 (*)    GFR calc Af Amer 48 (*)    Anion gap 16 (*)    All other components within normal limits  FERRITIN - Abnormal; Notable for the following components:    Ferritin 4,327 (*)    All other components within normal limits  IRON AND TIBC - Abnormal; Notable for the following components:   Iron 21 (*)    TIBC 192 (*)    Saturation Ratios 11 (*)    All other components within normal limits  PROTEIN / CREATININE RATIO, URINE - Abnormal; Notable for the following components:   Protein Creatinine Ratio 0.41 (*)    All other components within normal limits  GLUCOSE, CAPILLARY - Abnormal; Notable for the following components:   Glucose-Capillary 189 (*)    All other components within normal limits  GLUCOSE, CAPILLARY - Abnormal; Notable for the following components:   Glucose-Capillary 199 (*)    All other components within normal limits  LIPID PANEL - Abnormal; Notable for the following components:   HDL 22 (*)    All other components within normal limits  I-STAT CHEM 8, ED - Abnormal; Notable for the following components:   Sodium 158 (*)    Chloride 129 (*)    BUN 67 (*)    Creatinine, Ser 1.50 (*)    Glucose, Bld 200 (*)    TCO2 21 (*)    Hemoglobin 11.6 (*)    HCT 34.0 (*)    All other components within normal limits  CBG MONITORING, ED - Abnormal; Notable for the following components:   Glucose-Capillary 190 (*)    All other components within normal limits  I-STAT CHEM 8, ED - Abnormal; Notable for the following components:   Sodium 157 (*)    Chloride 127 (*)    BUN 79 (*)    Creatinine, Ser 1.60 (*)    Glucose, Bld 198 (*)    Hemoglobin 10.2 (*)    HCT 30.0 (*)    All other components within normal limits  SARS CORONAVIRUS 2 (HOSPITAL ORDER, [place] [facility])  CULTURE, BLOOD (ROUTINE X 2)  CULTURE, BLOOD (ROUTINE X 2)  MRSA PCR SCREENING  URINE CULTURE  APTT  TROPONIN I (HIGH SENSITIVITY)  TROPONIN I (HIGH SENSITIVITY)  VITAMIN B12  SODIUM, URINE, RANDOM  TSH  HEMOGLOBIN A1C  FOLATE RBC  PROTEIN ELECTROPHORESIS, SERUM  UPEP/UIFE/LIGHT CHAINS/TP, 24-HR UR  SEDIMENTATION RATE  RPR  ANA  CBC   CYTOLOGY - NON PAP    EKG EKG Interpretation  Date/Time:  Tuesday December 11 2018 15:57:53 EDT Ventricular Rate:  128 PR Interval:    QRS Duration: 80 QT Interval:  287 QTC Calculation: 419 R Axis:   89 Text Interpretation:  sinus with underlying artifact Anterior infarct, old Nonspecific T abnormalities, inferior leads no prior to compare with Confirmed by Aletta Edouard [phone]) on 12/11/2018 4:30:35 PM   Radiology Ct Angio Head W Or Wo Contrast  Result Date: 12/11/2018 CLINICAL DATA:  Left facial droop and left arm weakness. EXAM: CT ANGIOGRAPHY  HEAD AND NECK CT PERFUSION BRAIN TECHNIQUE: Multidetector CT imaging of the head and neck was performed using the standard protocol during bolus administration of intravenous contrast. Multiplanar CT image reconstructions and MIPs were obtained to evaluate the vascular anatomy. Carotid stenosis measurements (when applicable) are obtained utilizing NASCET criteria, using the distal internal carotid diameter as the denominator. Multiphase CT imaging of the brain was performed following IV bolus contrast injection. Subsequent parametric perfusion maps were calculated using RAPID software. CONTRAST:  116mL OMNIPAQUE IOHEXOL 350 MG/ML SOLN COMPARISON:  Brain MRI 11/07/2018.  No prior angiographic imaging. FINDINGS: CTA NECK FINDINGS Aortic arch: Standard 3 vessel aortic arch with mild-to-moderate atherosclerotic plaque. No significant arch vessel origin stenosis. Right carotid system: Patent with mild-to-moderate predominantly calcified plaque in the distal common and proximal internal carotid arteries without evidence of significant stenosis or dissection. Left carotid system: Patent with extensive calcified plaque in the mid and distal common carotid artery resulting in 55% stenosis. Mild calcified plaque in the proximal ICA without significant stenosis. Vertebral arteries: Patent and dominant right vertebral artery with calcified plaque at its origin not  resulting in significant stenosis. Diminutive left vertebral artery with severe origin stenosis and poor visualization of the remainder of the V1 segment due to venous contrast. Patent but diffusely small and irregular left V2 and V3 segments. Skeleton: Moderate cervical spondylosis. Other neck: No evidence of cervical lymphadenopathy or mass. Upper chest: Clear lung apices. Review of the MIP images confirms the above findings CTA HEAD FINDINGS Anterior circulation: The internal carotid arteries are patent from skull base to carotid termini with extensive calcified plaque resulting in mild cavernous and moderate supraclinoid stenoses bilaterally. ACAs and MCAs are patent without evidence of proximal branch occlusion or significant A1 or M1 stenosis. There are prominent branch vessel atherosclerotic changes including multiple severe M2 and A2 stenoses. No aneurysm is identified. Posterior circulation: The intracranial right vertebral artery is patent with mild atherosclerotic irregularity but no significant stenosis. The left V4 segment is occluded distal to the PICA origin, likely chronic based on the prior MRI. The basilar artery is patent with mild atherosclerotic irregularity but no flow limiting stenosis. Patent SCA is are seen bilaterally. There is a small right posterior communicating artery. Both PCAs are patent with diffuse atherosclerotic irregularity. There are mild right P1 and severe mid right P2 stenoses. No aneurysm is identified. Venous sinuses: As permitted by contrast timing, patent. Anatomic variants: None. Review of the MIP images confirms the above findings CT Brain Perfusion Findings: ASPECTS: 10 CBF (<30%) Volume: 45mL Perfusion (Tmax>6.0s) volume: 38mL Mismatch Volume: 84mL Infarction Location:None IMPRESSION: 1. No emergent large vessel occlusion. 2. 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 3. Patent right vertebral artery without significant stenosis. 4. 55%  left common carotid artery stenosis. 5. Advanced intracranial atherosclerosis including moderate bilateral ICA stenoses and multiple severe anterior and posterior circulation branch vessel stenoses. 6. Negative CT perfusion imaging. These results were communicated to Dr. Erlinda Hong at 4:00 pmon 12/11/2018 by text page via the [facility] messaging system. Electronically Signed   By: Logan Bores M.D.   On: 12/11/2018 17:58   Ct Angio Neck W Or Wo Contrast  Result Date: 12/11/2018 CLINICAL DATA:  Left facial droop and left arm weakness. EXAM: CT ANGIOGRAPHY HEAD AND NECK CT PERFUSION BRAIN TECHNIQUE: Multidetector CT imaging of the head and neck was performed using the standard protocol during bolus administration of intravenous contrast. Multiplanar CT image reconstructions and MIPs were obtained to evaluate the  vascular anatomy. Carotid stenosis measurements (when applicable) are obtained utilizing NASCET criteria, using the distal internal carotid diameter as the denominator. Multiphase CT imaging of the brain was performed following IV bolus contrast injection. Subsequent parametric perfusion maps were calculated using RAPID software. CONTRAST:  141mL OMNIPAQUE IOHEXOL 350 MG/ML SOLN COMPARISON:  Brain MRI 11/07/2018.  No prior angiographic imaging. FINDINGS: CTA NECK FINDINGS Aortic arch: Standard 3 vessel aortic arch with mild-to-moderate atherosclerotic plaque. No significant arch vessel origin stenosis. Right carotid system: Patent with mild-to-moderate predominantly calcified plaque in the distal common and proximal internal carotid arteries without evidence of significant stenosis or dissection. Left carotid system: Patent with extensive calcified plaque in the mid and distal common carotid artery resulting in 55% stenosis. Mild calcified plaque in the proximal ICA without significant stenosis. Vertebral arteries: Patent and dominant right vertebral artery with calcified plaque at its origin not resulting in  significant stenosis. Diminutive left vertebral artery with severe origin stenosis and poor visualization of the remainder of the V1 segment due to venous contrast. Patent but diffusely small and irregular left V2 and V3 segments. Skeleton: Moderate cervical spondylosis. Other neck: No evidence of cervical lymphadenopathy or mass. Upper chest: Clear lung apices. Review of the MIP images confirms the above findings CTA HEAD FINDINGS Anterior circulation: The internal carotid arteries are patent from skull base to carotid termini with extensive calcified plaque resulting in mild cavernous and moderate supraclinoid stenoses bilaterally. ACAs and MCAs are patent without evidence of proximal branch occlusion or significant A1 or M1 stenosis. There are prominent branch vessel atherosclerotic changes including multiple severe M2 and A2 stenoses. No aneurysm is identified. Posterior circulation: The intracranial right vertebral artery is patent with mild atherosclerotic irregularity but no significant stenosis. The left V4 segment is occluded distal to the PICA origin, likely chronic based on the prior MRI. The basilar artery is patent with mild atherosclerotic irregularity but no flow limiting stenosis. Patent SCA is are seen bilaterally. There is a small right posterior communicating artery. Both PCAs are patent with diffuse atherosclerotic irregularity. There are mild right P1 and severe mid right P2 stenoses. No aneurysm is identified. Venous sinuses: As permitted by contrast timing, patent. Anatomic variants: None. Review of the MIP images confirms the above findings CT Brain Perfusion Findings: ASPECTS: 10 CBF (<30%) Volume: 56mL Perfusion (Tmax>6.0s) volume: 72mL Mismatch Volume: 27mL Infarction Location:None IMPRESSION: 1. No emergent large vessel occlusion. 2. 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 3. Patent right vertebral artery without significant stenosis. 4. 55% left common  carotid artery stenosis. 5. Advanced intracranial atherosclerosis including moderate bilateral ICA stenoses and multiple severe anterior and posterior circulation branch vessel stenoses. 6. Negative CT perfusion imaging. These results were communicated to Dr. Erlinda Hong at 4:00 pmon 12/11/2018 by text page via the [facility] messaging system. Electronically Signed   By: Logan Bores M.D.   On: 12/11/2018 17:58   Mr Brain Wo Contrast  Result Date: 12/11/2018 CLINICAL DATA:  73 y/o  M; TIA, initial exam. EXAM: MRI HEAD WITHOUT CONTRAST TECHNIQUE: Axial DWI, axial T2 FLAIR, axial T2 propeller, and axial SWI sequences were acquired. Patient was unable to continue and additional sequences were not acquired. COMPARISON:  None. FINDINGS: Brain: No reduced diffusion to suggest acute or early subacute infarction. Nonspecific confluent foci of T2 FLAIR hyperintense signal abnormality are present in subcortical and periventricular white matter compatible with moderate to severe chronic microvascular ischemic changes. There is moderate to severe volume loss the brain. No  focal mass effect, extra-axial collection, hydrocephalus, or herniation identified. Motion degraded SWI sequence. Vascular: Normal flow voids. Skull and upper cervical spine: Normal marrow signal. Sinuses/Orbits: Negative. Other: None. IMPRESSION: Axial DWI, T2 FLAIR, T2, and SWI sequences were acquired. Motion degraded SWI sequence. 1. No acute stroke, mass effect, or extra-axial collection identified. 2. Moderate to severe chronic microvascular ischemic changes and volume loss of the brain. Electronically Signed   By: Kristine Garbe M.D.   On: 12/11/2018 22:42   Ct Cerebral Perfusion W Contrast  Result Date: 12/11/2018 CLINICAL DATA:  Left facial droop and left arm weakness. EXAM: CT ANGIOGRAPHY HEAD AND NECK CT PERFUSION BRAIN TECHNIQUE: Multidetector CT imaging of the head and neck was performed using the standard protocol during bolus administration of  intravenous contrast. Multiplanar CT image reconstructions and MIPs were obtained to evaluate the vascular anatomy. Carotid stenosis measurements (when applicable) are obtained utilizing NASCET criteria, using the distal internal carotid diameter as the denominator. Multiphase CT imaging of the brain was performed following IV bolus contrast injection. Subsequent parametric perfusion maps were calculated using RAPID software. CONTRAST:  16mL OMNIPAQUE IOHEXOL 350 MG/ML SOLN COMPARISON:  Brain MRI 11/07/2018.  No prior angiographic imaging. FINDINGS: CTA NECK FINDINGS Aortic arch: Standard 3 vessel aortic arch with mild-to-moderate atherosclerotic plaque. No significant arch vessel origin stenosis. Right carotid system: Patent with mild-to-moderate predominantly calcified plaque in the distal common and proximal internal carotid arteries without evidence of significant stenosis or dissection. Left carotid system: Patent with extensive calcified plaque in the mid and distal common carotid artery resulting in 55% stenosis. Mild calcified plaque in the proximal ICA without significant stenosis. Vertebral arteries: Patent and dominant right vertebral artery with calcified plaque at its origin not resulting in significant stenosis. Diminutive left vertebral artery with severe origin stenosis and poor visualization of the remainder of the V1 segment due to venous contrast. Patent but diffusely small and irregular left V2 and V3 segments. Skeleton: Moderate cervical spondylosis. Other neck: No evidence of cervical lymphadenopathy or mass. Upper chest: Clear lung apices. Review of the MIP images confirms the above findings CTA HEAD FINDINGS Anterior circulation: The internal carotid arteries are patent from skull base to carotid termini with extensive calcified plaque resulting in mild cavernous and moderate supraclinoid stenoses bilaterally. ACAs and MCAs are patent without evidence of proximal branch occlusion or  significant A1 or M1 stenosis. There are prominent branch vessel atherosclerotic changes including multiple severe M2 and A2 stenoses. No aneurysm is identified. Posterior circulation: The intracranial right vertebral artery is patent with mild atherosclerotic irregularity but no significant stenosis. The left V4 segment is occluded distal to the PICA origin, likely chronic based on the prior MRI. The basilar artery is patent with mild atherosclerotic irregularity but no flow limiting stenosis. Patent SCA is are seen bilaterally. There is a small right posterior communicating artery. Both PCAs are patent with diffuse atherosclerotic irregularity. There are mild right P1 and severe mid right P2 stenoses. No aneurysm is identified. Venous sinuses: As permitted by contrast timing, patent. Anatomic variants: None. Review of the MIP images confirms the above findings CT Brain Perfusion Findings: ASPECTS: 10 CBF (<30%) Volume: 20mL Perfusion (Tmax>6.0s) volume: 65mL Mismatch Volume: 75mL Infarction Location:None IMPRESSION: 1. No emergent large vessel occlusion. 2. 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 3. Patent right vertebral artery without significant stenosis. 4. 55% left common carotid artery stenosis. 5. 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 6. Negative CT perfusion imaging. These results were communicated to Dr. Erlinda Hong at 4:00 pmon 12/11/2018 by text page via the [facility] messaging system. Electronically Signed   By: Logan Bores M.D.   On: 12/11/2018 17:58   Dg Chest Port 1 View  Result Date: 12/11/2018 CLINICAL DATA:  73 year old male with weakness. EXAM: PORTABLE CHEST 1 VIEW COMPARISON:  CTA head and neck, CT perfusion today. FINDINGS: Portable AP semi upright view at 1853 hours. Somewhat low lung volumes. Normal cardiac size and mediastinal contours.  Visualized tracheal air column is within normal limits. Crowding of lung markings at the bases. Otherwise Allowing for portable technique the lungs are clear. No pneumothorax. Paucity of bowel gas. No acute osseous abnormality identified. IMPRESSION: Low lung volumes with mild atelectasis. Electronically Signed   By: Genevie Ann M.D.   On: 12/11/2018 19:21   Ct Head Code Stroke Wo Contrast  Result Date: 12/11/2018 CLINICAL DATA:  Code stroke. Left facial droop and left arm weakness. EXAM: CT HEAD WITHOUT CONTRAST TECHNIQUE: Contiguous axial images were obtained from the base of the skull through the vertex without intravenous contrast. COMPARISON:  Brain MRI 11/07/2018 FINDINGS: Brain: There is no evidence of acute infarct, intracranial hemorrhage, mass, midline shift, or extra-axial fluid collection. There is mild-to-moderate cerebral atrophy. Patchy to confluent cerebral white matter hypodensities are nonspecific but compatible with moderate chronic small vessel ischemic disease. Vascular: Calcified atherosclerosis at the skull base. No hyperdense vessel. Skull: No fracture or focal osseous lesion. Sinuses/Orbits: Mild mucosal thickening in the paranasal sinuses. Clear mastoid air cells. Unremarkable orbits. Other: None. ASPECTS [facility] Stroke Program Early CT Score) - Ganglionic level infarction (caudate, lentiform nuclei, internal capsule, insula, M1-M3 cortex): 7 - Supraganglionic infarction (M4-M6 cortex): 3 Total score (0-10 with 10 being normal): 10 IMPRESSION: 1. No evidence of acute intracranial abnormality. 2. ASPECTS is 10. 3. 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 These results were called by telephone at the time of interpretation on 12/11/2018 at 3:45 p.m. to Dr. Erlinda Hong, who verbally acknowledged these results. Electronically Signed   By: Logan Bores M.D.   On: 12/11/2018 17:27    Procedures .Critical Care Performed by: Hayden Rasmussen, MD Authorized by: Hayden Rasmussen, MD   Critical care provider statement:    Critical care time (minutes):  45   Critical care time was exclusive of:  Separately billable procedures and treating other patients   Critical care was necessary to treat or prevent imminent or life-threatening deterioration of the following conditions:  CNS failure or compromise   Critical care was time spent personally by me on the following activities:  Discussions with consultants, evaluation of patient's response to treatment, examination of patient, ordering and performing treatments and interventions, ordering and review of laboratory studies, ordering and review of radiographic studies, pulse oximetry, re-evaluation of patient's condition, obtaining history from patient or surrogate, review of old charts and development of treatment plan with patient or surrogate   I assumed direction of critical care for this patient from another provider in my specialty: no     (including critical care time)  Medications Ordered in ED Medications  sodium chloride flush (NS) 0.9 % injection 3 mL (has no administration in time range)  iohexol (OMNIPAQUE) 350 MG/ML injection 100 mL (100 mLs Intravenous Contrast Given 12/11/18 1544)     Initial Impression / Assessment and Plan / ED Course  I have reviewed the triage vital signs and the nursing  notes.  Pertinent labs & imaging results that were available during my care of the patient were reviewed by me and considered in my medical decision making (see chart for details).  Clinical Course as of Dec 11 856  Tue Dec 11, 4215  8031 73 year old male level 5 caveat here with a very confusing story of some altered mental status possibly related to stroke or may be to medications or infection metabolic.  He was evaluated by stroke on arrival had a CT head along with CT NGO head and neck that did not show any acute findings.  Waiting for final reading from radiology but I did not see anything on the initial  scans.  He is getting lab work chest x-ray urinalysis blood cultures.  Neurology is recommending the patient get an EEG to see if he is seizing and that is being started next   [MB]  1626 Patient is a little more verbal and seems to make a little bit of sense.  His EEG is not showing any gross seizure activity but does have some focal tremor of his left upper extremity.   [MB]  2641 Patient sodium markedly high at 157.  Creatinine is elevated at 1.8.  Unclear what his baseline is as we have no prior records.  He is also anemic with a hemoglobin of 9.   [MB]  5830 Discussed with Dr. Maudie Mercury from the hospitalist service who will evaluate the patient for admission.   [MB]  [place] unchanged.   [MB]    Clinical Course User Index [MB] Hayden Rasmussen, MD        Final Clinical Impressions(s) / ED Diagnoses   Final diagnoses:  Altered mental status, unspecified altered mental status type  Tachycardia    ED Discharge Orders    None       Hayden Rasmussen, MD 12/12/18 [phone]

## 2018-12-11 NOTE — Plan of Care (Addendum)
Follow-up with patient after study EEG.  Patient has bilateral arm/hand shivering, which was not correlating with any epileptiform discharges.  Patient now become more awake alert then presentation, eyes open, follow simple commands around left hand (showing fingers, making fist on request ) and wiggle toes on the left foot on request.  Still not answer orientation questions, severe dysarthria not able to be understood.  Heart rate 130s, EKG no STEMI, serial troponin pending. Continue recommendation as per consult note.   Rosalin Hawking, MD PhD Stroke Neurology 12/11/2018 4:55 PM

## 2018-12-11 NOTE — ED Notes (Signed)
Patient transported to MRI 

## 2018-12-11 NOTE — ED Notes (Signed)
ED TO INPATIENT HANDOFF REPORT  ED Nurse Name and Phone #: Jess F   S Name/Age/Gender Julian Tyler 73 y.o. male Room/Bed: 040C/040C  Code Status   Code Status: Full Code  Home/SNF/Other Skilled nursing facility Patient oriented to: self and time Is this baseline? Yes   Triage Complete: Triage complete  Chief Complaint code stroke  Triage Note Pt brought in by [facility] from [facility] facility as a code stroke. Per facility pt has hx of stroke with right sided deficits and slurred speech at baseline. Pt last known normal at baseline is 1000 this am. Per facility pt is more lethargic and speech is more slurred. CBG 024, BP 097 systolic with fire on scene.    Allergies Allergies  Allergen Reactions  . Hctz [Hydrochlorothiazide] Swelling  . Sulfa Drugs Cross Reactors Swelling  . Zithromax [Azithromycin] Diarrhea  . Robaxin [Methocarbamol] Other (See Comments)    Per MAR    Level of Care/Admitting Diagnosis ED Disposition    ED Disposition Condition Comment   Admit  Hospital Area: [facility] [[id number]]  Level of Care: Telemetry Medical [104]  I expect the patient will be discharged within 24 hours: No (not a candidate for 5C-Observation unit)  Covid Evaluation: Screening Protocol (No Symptoms)  Diagnosis: Altered mental status [780.97.ICD-9-CM]  Admitting Physician: Jani Gravel [[id number]]  Attending Physician: Jani Gravel [[id number]]  PT Class (Do Not Modify): Observation [104]  PT Acc Code (Do Not Modify): Observation [10022]       B Medical/Surgery History Past Medical History:  Diagnosis Date  . Cataract   . Chorioretinal scar of left eye   . Diabetes ([place])   . Dry eyes   . Glaucoma   . HLD (hyperlipidemia)   . HTN (hypertension)   . Stroke [facility])    No past surgical history on file.   A IV Location/Drains/Wounds Patient Lines/Drains/Airways Status   Active Line/Drains/Airways    Name:   Placement date:   Placement time:   Site:   Days:    Peripheral IV 12/11/18 Left Antecubital   12/11/18    1545    Antecubital   less than 1   External Urinary Catheter   12/11/18    1826    -   less than 1          Intake/Output Last 24 hours  Intake/Output Summary (Last 24 hours) at 12/11/2018 2203 Last data filed at 12/11/2018 2040 Gross per 24 hour  Intake 1000 ml  Output -  Net 1000 ml    Labs/Imaging Results for orders placed or performed during the hospital encounter of 12/11/18 (from the past 48 hour(s))  I-stat chem 8, ED     Status: Abnormal   Collection Time: 12/11/18  3:25 PM  Result Value Ref Range   Sodium 158 (H) 135 - 145 mmol/L   Potassium 4.9 3.5 - 5.1 mmol/L   Chloride 129 (H) 98 - 111 mmol/L   BUN 67 (H) 8 - 23 mg/dL   Creatinine, Ser 1.50 (H) 0.61 - 1.24 mg/dL   Glucose, Bld 200 (H) 70 - 99 mg/dL   Calcium, Ion 1.24 1.15 - 1.40 mmol/L   TCO2 21 (L) 22 - 32 mmol/L   Hemoglobin 11.6 (L) 13.0 - 17.0 g/dL   HCT 34.0 (L) 39.0 - 52.0 %  Protime-INR     Status: Abnormal   Collection Time: 12/11/18  4:00 PM  Result Value Ref Range   Prothrombin Time 17.4 (H) 11.4 -  15.2 seconds   INR 1.4 (H) 0.8 - 1.2    Comment: (NOTE) INR goal varies based on device and disease states. Performed at [facility], [place] [address] [place], [place] [address]   APTT     Status: None   Collection Time: 12/11/18  4:00 PM  Result Value Ref Range   aPTT 30 24 - 36 seconds    Comment: Performed at [facility] [address] [place], [place] [address]  CBC     Status: Abnormal   Collection Time: 12/11/18  4:00 PM  Result Value Ref Range   WBC 14.2 (H) 4.0 - 10.5 K/uL   RBC 3.89 (L) 4.22 - 5.81 MIL/uL   Hemoglobin 9.1 (L) 13.0 - 17.0 g/dL    Comment: REPEATED TO VERIFY DELTA CHECK NOTED    HCT 32.3 (L) 39.0 - 52.0 %   MCV 83.0 80.0 - 100.0 fL   MCH 23.4 (L) 26.0 - 34.0 pg   MCHC 28.2 (L) 30.0 - 36.0 g/dL   RDW 15.3 11.5 - 15.5 %   Platelets 264 150 - 400 K/uL   nRBC 0.0 0.0 - 0.2 %    Comment: Performed  at [facility], [place] [address], [place], [place] [address]  Differential     Status: Abnormal   Collection Time: 12/11/18  4:00 PM  Result Value Ref Range   Neutrophils Relative % 77 %   Neutro Abs 10.9 (H) 1.7 - 7.7 K/uL   Lymphocytes Relative 11 %   Lymphs Abs 1.6 0.7 - 4.0 K/uL   Monocytes Relative 11 %   Monocytes Absolute 1.6 (H) 0.1 - 1.0 K/uL   Eosinophils Relative 0 %   Eosinophils Absolute 0.0 0.0 - 0.5 K/uL   Basophils Relative 0 %   Basophils Absolute 0.1 0.0 - 0.1 K/uL   Immature Granulocytes 1 %   Abs Immature Granulocytes 0.07 0.00 - 0.07 K/uL    Comment: Performed at [facility] [address] [place], [place] [address]  Comprehensive metabolic panel     Status: Abnormal   Collection Time: 12/11/18  4:00 PM  Result Value Ref Range   Sodium 155 (H) 135 - 145 mmol/L   Potassium 4.5 3.5 - 5.1 mmol/L   Chloride 125 (H) 98 - 111 mmol/L   CO2 19 (L) 22 - 32 mmol/L   Glucose, Bld 199 (H) 70 - 99 mg/dL   BUN 70 (H) 8 - 23 mg/dL   Creatinine, Ser 1.83 (H) 0.61 - 1.24 mg/dL   Calcium 9.5 8.9 - 10.3 mg/dL   Total Protein 8.0 6.5 - 8.1 g/dL   Albumin 2.4 (L) 3.5 - 5.0 g/dL   AST 34 15 - 41 U/L   ALT 20 0 - 44 U/L   Alkaline Phosphatase 53 38 - 126 U/L   Total Bilirubin 1.0 0.3 - 1.2 mg/dL   GFR calc non Af Amer 36 (L) >60 mL/min   GFR calc Af Amer 42 (L) >60 mL/min   Anion gap 11 5 - 15    Comment: Performed at [facility], [place] [address] [place], [place] [address]  I-stat chem 8, ed     Status: Abnormal   Collection Time: 12/11/18  4:22 PM  Result Value Ref Range   Sodium 157 (H) 135 - 145 mmol/L   Potassium 5.1 3.5 - 5.1 mmol/L   Chloride 127 (H) 98 - 111 mmol/L   BUN 79 (H)  8 - 23 mg/dL   Creatinine, Ser 1.60 (H) 0.61 - 1.24 mg/dL   Glucose, Bld 198 (H) 70 - 99 mg/dL   Calcium, Ion 1.24 1.15 - 1.40 mmol/L   TCO2 24 22 - 32 mmol/L   Hemoglobin 10.2 (L) 13.0 - 17.0 g/dL   HCT 30.0 (L) 39.0 - 52.0 %  Troponin I (High Sensitivity)      Status: None   Collection Time: 12/11/18  4:45 PM  Result Value Ref Range   Troponin I (High Sensitivity) 12 <18 ng/L    Comment: (NOTE) Elevated high sensitivity troponin I (hsTnI) values and significant  changes across serial measurements may suggest ACS but many other  chronic and acute conditions are known to elevate hsTnI results.  Refer to the "Links" section for chest pain algorithms and additional  guidance. Performed at [facility], [place] [address], [place], [place] [address]   SARS Coronavirus 2 (CEPHEID - Performed in [facility] hospital lab), Hosp Order     Status: None   Collection Time: 12/11/18  5:00 PM   Specimen: Nasopharyngeal Swab  Result Value Ref Range   SARS Coronavirus 2 NEGATIVE NEGATIVE    Comment: (NOTE) If result is NEGATIVE SARS-CoV-2 target nucleic acids are NOT DETECTED. The SARS-CoV-2 RNA is generally detectable in upper and lower  respiratory specimens during the acute phase of infection. The lowest  concentration of SARS-CoV-2 viral copies this assay can detect is 250  copies / mL. A negative result does not preclude SARS-CoV-2 infection  and should not be used as the sole basis for treatment or other  patient management decisions.  A negative result may occur with  improper specimen collection / handling, submission of specimen other  than nasopharyngeal swab, presence of viral mutation(s) within the  areas targeted by this assay, and inadequate number of viral copies  (<250 copies / mL). A negative result must be combined with clinical  observations, patient history, and epidemiological information. If result is POSITIVE SARS-CoV-2 target nucleic acids are DETECTED. The SARS-CoV-2 RNA is generally detectable in upper and lower  respiratory specimens dur ing the acute phase of infection.  Positive  results are indicative of active infection with SARS-CoV-2.  Clinical  correlation with patient history and other diagnostic information is   necessary to determine patient infection status.  Positive results do  not rule out bacterial infection or co-infection with other viruses. If result is PRESUMPTIVE POSTIVE SARS-CoV-2 nucleic acids MAY BE PRESENT.   A presumptive positive result was obtained on the submitted specimen  and confirmed on repeat testing.  While 2019 novel coronavirus  (SARS-CoV-2) nucleic acids may be present in the submitted sample  additional confirmatory testing may be necessary for epidemiological  and / or clinical management purposes  to differentiate between  SARS-CoV-2 and other Sarbecovirus currently known to infect humans.  If clinically indicated additional testing with an alternate test  methodology [phone]) is advised. The SARS-CoV-2 RNA is generally  detectable in upper and lower respiratory sp ecimens during the acute  phase of infection. The expected result is Negative. Fact Sheet for Patients:  StrictlyIdeas.no Fact Sheet for Healthcare Providers: BankingDealers.co.za This test is not yet approved or cleared by the [place] FDA and has been authorized for detection and/or diagnosis of SARS-CoV-2 by FDA under an Emergency Use Authorization (EUA).  This EUA will remain in effect (meaning this test can be used) for the duration of the COVID-19 declaration under Section 564(b)(1) of the  Act, 21 U.S.C. section 360bbb-3(b)(1), unless the authorization is terminated or revoked sooner. Performed at [facility], [place] [address], [place], [place] [address]   Lactic acid, plasma     Status: Abnormal   Collection Time: 12/11/18  5:00 PM  Result Value Ref Range   Lactic Acid, Venous 2.2 (HH) 0.5 - 1.9 mmol/L    Comment: CRITICAL RESULT CALLED TO, READ BACK BY AND VERIFIED WITH: R.KENNEDY RN [id number] 12/11/2018 MCCORMICK K Performed at [facility], [place] [address], [place], [place] [address]   Lactic acid, plasma     Status: Abnormal    Collection Time: 12/11/18  6:02 PM  Result Value Ref Range   Lactic Acid, Venous 2.3 (HH) 0.5 - 1.9 mmol/L    Comment: CRITICAL RESULT CALLED TO, READ BACK BY AND VERIFIED WITH: R.KENNEDY RN [id number] 12/11/2018 MCCORMICK K Performed at [facility] [address] [place], [place] [address]   Troponin I (High Sensitivity)     Status: None   Collection Time: 12/11/18  6:02 PM  Result Value Ref Range   Troponin I (High Sensitivity) 12 <18 ng/L    Comment: (NOTE) Elevated high sensitivity troponin I (hsTnI) values and significant  changes across serial measurements may suggest ACS but many other  chronic and acute conditions are known to elevate hsTnI results.  Refer to the "Links" section for chest pain algorithms and additional  guidance. Performed at [facility], [place] [address], [place], [place] [address]   Valproic acid level     Status: Abnormal   Collection Time: 12/11/18  6:02 PM  Result Value Ref Range   Valproic Acid Lvl <10 (L) 50.0 - 100.0 ug/mL    Comment: RESULTS CONFIRMED BY MANUAL DILUTION Performed at [facility] [address], [place], [place] [address]   CBG monitoring, ED     Status: Abnormal   Collection Time: 12/11/18  6:46 PM  Result Value Ref Range   Glucose-Capillary 190 (H) 70 - 99 mg/dL   Ct Angio Head W Or Wo Contrast  Result Date: 12/11/2018 CLINICAL DATA:  Left facial droop and left arm weakness. EXAM: CT ANGIOGRAPHY HEAD AND NECK CT PERFUSION BRAIN TECHNIQUE: Multidetector CT imaging of the head and neck was performed using the standard protocol during bolus administration of intravenous contrast. Multiplanar CT image reconstructions and MIPs were obtained to evaluate the vascular anatomy. Carotid stenosis measurements (when applicable) are obtained utilizing NASCET criteria, using the distal internal carotid diameter as the denominator. Multiphase CT imaging of the brain was performed following IV bolus contrast injection. Subsequent  parametric perfusion maps were calculated using RAPID software. CONTRAST:  110mL OMNIPAQUE IOHEXOL 350 MG/ML SOLN COMPARISON:  Brain MRI 11/07/2018.  No prior angiographic imaging. FINDINGS: CTA NECK FINDINGS Aortic arch: Standard 3 vessel aortic arch with mild-to-moderate atherosclerotic plaque. No significant arch vessel origin stenosis. Right carotid system: Patent with mild-to-moderate predominantly calcified plaque in the distal common and proximal internal carotid arteries without evidence of significant stenosis or dissection. Left carotid system: Patent with extensive calcified plaque in the mid and distal common carotid artery resulting in 55% stenosis. Mild calcified plaque in the proximal ICA without significant stenosis. Vertebral arteries: Patent and dominant right vertebral artery with calcified plaque at its origin not resulting in significant stenosis. Diminutive left vertebral artery with severe origin stenosis and poor visualization of the remainder of the V1 segment due to venous contrast. Patent but diffusely small and irregular left  V2 and V3 segments. Skeleton: Moderate cervical spondylosis. Other neck: No evidence of cervical lymphadenopathy or mass. Upper chest: Clear lung apices. Review of the MIP images confirms the above findings CTA HEAD FINDINGS Anterior circulation: The internal carotid arteries are patent from skull base to carotid termini with extensive calcified plaque resulting in mild cavernous and moderate supraclinoid stenoses bilaterally. ACAs and MCAs are patent without evidence of proximal branch occlusion or significant A1 or M1 stenosis. There are prominent branch vessel atherosclerotic changes including multiple severe M2 and A2 stenoses. No aneurysm is identified. Posterior circulation: The intracranial right vertebral artery is patent with mild atherosclerotic irregularity but no significant stenosis. The left V4 segment is occluded distal to the PICA origin, likely  chronic based on the prior MRI. The basilar artery is patent with mild atherosclerotic irregularity but no flow limiting stenosis. Patent SCA is are seen bilaterally. There is a small right posterior communicating artery. Both PCAs are patent with diffuse atherosclerotic irregularity. There are mild right P1 and severe mid right P2 stenoses. No aneurysm is identified. Venous sinuses: As permitted by contrast timing, patent. Anatomic variants: None. Review of the MIP images confirms the above findings CT Brain Perfusion Findings: ASPECTS: 10 CBF (<30%) Volume: 66mL Perfusion (Tmax>6.0s) volume: 69mL Mismatch Volume: 48mL Infarction Location:None IMPRESSION: 1. No emergent large vessel occlusion. 2. 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 3. Patent right vertebral artery without significant stenosis. 4. 55% left common carotid artery stenosis. 5. Advanced intracranial atherosclerosis including moderate bilateral ICA stenoses and multiple severe anterior and posterior circulation branch vessel stenoses. 6. Negative CT perfusion imaging. These results were communicated to Dr. Erlinda Hong at 4:00 pmon 12/11/2018 by text page via the [facility] messaging system. Electronically Signed   By: Logan Bores M.D.   On: 12/11/2018 17:58   Ct Angio Neck W Or Wo Contrast  Result Date: 12/11/2018 CLINICAL DATA:  Left facial droop and left arm weakness. EXAM: CT ANGIOGRAPHY HEAD AND NECK CT PERFUSION BRAIN TECHNIQUE: Multidetector CT imaging of the head and neck was performed using the standard protocol during bolus administration of intravenous contrast. Multiplanar CT image reconstructions and MIPs were obtained to evaluate the vascular anatomy. Carotid stenosis measurements (when applicable) are obtained utilizing NASCET criteria, using the distal internal carotid diameter as the denominator. Multiphase CT imaging of the brain was performed following IV bolus contrast injection. Subsequent parametric  perfusion maps were calculated using RAPID software. CONTRAST:  158mL OMNIPAQUE IOHEXOL 350 MG/ML SOLN COMPARISON:  Brain MRI 11/07/2018.  No prior angiographic imaging. FINDINGS: CTA NECK FINDINGS Aortic arch: Standard 3 vessel aortic arch with mild-to-moderate atherosclerotic plaque. No significant arch vessel origin stenosis. Right carotid system: Patent with mild-to-moderate predominantly calcified plaque in the distal common and proximal internal carotid arteries without evidence of significant stenosis or dissection. Left carotid system: Patent with extensive calcified plaque in the mid and distal common carotid artery resulting in 55% stenosis. Mild calcified plaque in the proximal ICA without significant stenosis. Vertebral arteries: Patent and dominant right vertebral artery with calcified plaque at its origin not resulting in significant stenosis. Diminutive left vertebral artery with severe origin stenosis and poor visualization of the remainder of the V1 segment due to venous contrast. Patent but diffusely small and irregular left V2 and V3 segments. Skeleton: Moderate cervical spondylosis. Other neck: No evidence of cervical lymphadenopathy or mass. Upper chest: Clear lung apices. Review of the MIP images confirms the above findings CTA HEAD FINDINGS Anterior circulation: The internal  carotid arteries are patent from skull base to carotid termini with extensive calcified plaque resulting in mild cavernous and moderate supraclinoid stenoses bilaterally. ACAs and MCAs are patent without evidence of proximal branch occlusion or significant A1 or M1 stenosis. There are prominent branch vessel atherosclerotic changes including multiple severe M2 and A2 stenoses. No aneurysm is identified. Posterior circulation: The intracranial right vertebral artery is patent with mild atherosclerotic irregularity but no significant stenosis. The left V4 segment is occluded distal to the PICA origin, likely chronic based on  the prior MRI. The basilar artery is patent with mild atherosclerotic irregularity but no flow limiting stenosis. Patent SCA is are seen bilaterally. There is a small right posterior communicating artery. Both PCAs are patent with diffuse atherosclerotic irregularity. There are mild right P1 and severe mid right P2 stenoses. No aneurysm is identified. Venous sinuses: As permitted by contrast timing, patent. Anatomic variants: None. Review of the MIP images confirms the above findings CT Brain Perfusion Findings: ASPECTS: 10 CBF (<30%) Volume: 70mL Perfusion (Tmax>6.0s) volume: 20mL Mismatch Volume: 44mL Infarction Location:None IMPRESSION: 1. No emergent large vessel occlusion. 2. 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 3. Patent right vertebral artery without significant stenosis. 4. 55% left common carotid artery stenosis. 5. Advanced intracranial atherosclerosis including moderate bilateral ICA stenoses and multiple severe anterior and posterior circulation branch vessel stenoses. 6. Negative CT perfusion imaging. These results were communicated to Dr. Erlinda Hong at 4:00 pmon 12/11/2018 by text page via the [facility] messaging system. Electronically Signed   By: Logan Bores M.D.   On: 12/11/2018 17:58   Ct Cerebral Perfusion W Contrast  Result Date: 12/11/2018 CLINICAL DATA:  Left facial droop and left arm weakness. EXAM: CT ANGIOGRAPHY HEAD AND NECK CT PERFUSION BRAIN TECHNIQUE: Multidetector CT imaging of the head and neck was performed using the standard protocol during bolus administration of intravenous contrast. Multiplanar CT image reconstructions and MIPs were obtained to evaluate the vascular anatomy. Carotid stenosis measurements (when applicable) are obtained utilizing NASCET criteria, using the distal internal carotid diameter as the denominator. Multiphase CT imaging of the brain was performed following IV bolus contrast injection. Subsequent parametric perfusion maps were  calculated using RAPID software. CONTRAST:  174mL OMNIPAQUE IOHEXOL 350 MG/ML SOLN COMPARISON:  Brain MRI 11/07/2018.  No prior angiographic imaging. FINDINGS: CTA NECK FINDINGS Aortic arch: Standard 3 vessel aortic arch with mild-to-moderate atherosclerotic plaque. No significant arch vessel origin stenosis. Right carotid system: Patent with mild-to-moderate predominantly calcified plaque in the distal common and proximal internal carotid arteries without evidence of significant stenosis or dissection. Left carotid system: Patent with extensive calcified plaque in the mid and distal common carotid artery resulting in 55% stenosis. Mild calcified plaque in the proximal ICA without significant stenosis. Vertebral arteries: Patent and dominant right vertebral artery with calcified plaque at its origin not resulting in significant stenosis. Diminutive left vertebral artery with severe origin stenosis and poor visualization of the remainder of the V1 segment due to venous contrast. Patent but diffusely small and irregular left V2 and V3 segments. Skeleton: Moderate cervical spondylosis. Other neck: No evidence of cervical lymphadenopathy or mass. Upper chest: Clear lung apices. Review of the MIP images confirms the above findings CTA HEAD FINDINGS Anterior circulation: The internal carotid arteries are patent from skull base to carotid termini with extensive calcified plaque resulting in mild cavernous and moderate supraclinoid stenoses bilaterally. ACAs and MCAs are patent without evidence of proximal branch occlusion or significant A1 or M1 stenosis.  There are prominent branch vessel atherosclerotic changes including multiple severe M2 and A2 stenoses. No aneurysm is identified. Posterior circulation: The intracranial right vertebral artery is patent with mild atherosclerotic irregularity but no significant stenosis. The left V4 segment is occluded distal to the PICA origin, likely chronic based on the prior MRI. The  basilar artery is patent with mild atherosclerotic irregularity but no flow limiting stenosis. Patent SCA is are seen bilaterally. There is a small right posterior communicating artery. Both PCAs are patent with diffuse atherosclerotic irregularity. There are mild right P1 and severe mid right P2 stenoses. No aneurysm is identified. Venous sinuses: As permitted by contrast timing, patent. Anatomic variants: None. Review of the MIP images confirms the above findings CT Brain Perfusion Findings: ASPECTS: 10 CBF (<30%) Volume: 52mL Perfusion (Tmax>6.0s) volume: 53mL Mismatch Volume: 44mL Infarction Location:None IMPRESSION: 1. No emergent large vessel occlusion. 2. 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 3. Patent right vertebral artery without significant stenosis. 4. 55% left common carotid artery stenosis. 5. Advanced intracranial atherosclerosis including moderate bilateral ICA stenoses and multiple severe anterior and posterior circulation branch vessel stenoses. 6. Negative CT perfusion imaging. These results were communicated to Dr. Erlinda Hong at 4:00 pmon 12/11/2018 by text page via the [facility] messaging system. Electronically Signed   By: Logan Bores M.D.   On: 12/11/2018 17:58   Dg Chest Port 1 View  Result Date: 12/11/2018 CLINICAL DATA:  73 year old male with weakness. EXAM: PORTABLE CHEST 1 VIEW COMPARISON:  CTA head and neck, CT perfusion today. FINDINGS: Portable AP semi upright view at 1853 hours. Somewhat low lung volumes. Normal cardiac size and mediastinal contours. Visualized tracheal air column is within normal limits. Crowding of lung markings at the bases. Otherwise Allowing for portable technique the lungs are clear. No pneumothorax. Paucity of bowel gas. No acute osseous abnormality identified. IMPRESSION: Low lung volumes with mild atelectasis. Electronically Signed   By: Genevie Ann M.D.   On: 12/11/2018 19:21   Ct Head Code Stroke Wo Contrast  Result Date:  12/11/2018 CLINICAL DATA:  Code stroke. Left facial droop and left arm weakness. EXAM: CT HEAD WITHOUT CONTRAST TECHNIQUE: Contiguous axial images were obtained from the base of the skull through the vertex without intravenous contrast. COMPARISON:  Brain MRI 11/07/2018 FINDINGS: Brain: There is no evidence of acute infarct, intracranial hemorrhage, mass, midline shift, or extra-axial fluid collection. There is mild-to-moderate cerebral atrophy. Patchy to confluent cerebral white matter hypodensities are nonspecific but compatible with moderate chronic small vessel ischemic disease. Vascular: Calcified atherosclerosis at the skull base. No hyperdense vessel. Skull: No fracture or focal osseous lesion. Sinuses/Orbits: Mild mucosal thickening in the paranasal sinuses. Clear mastoid air cells. Unremarkable orbits. Other: None. ASPECTS [facility] Stroke Program Early CT Score) - Ganglionic level infarction (caudate, lentiform nuclei, internal capsule, insula, M1-M3 cortex): 7 - Supraganglionic infarction (M4-M6 cortex): 3 Total score (0-10 with 10 being normal): 10 IMPRESSION: 1. No evidence of acute intracranial abnormality. 2. ASPECTS is 10. 3. 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 These results were called by telephone at the time of interpretation on 12/11/2018 at 3:45 p.m. to Dr. Erlinda Hong, who verbally acknowledged these results. Electronically Signed   By: Logan Bores M.D.   On: 12/11/2018 17:27    Pending Labs Unresulted Labs (From admission, onward)    Start     Ordered   12/18/18 0500  Creatinine, serum  (enoxaparin (LOVENOX)    CrCl < 30 ml/min)  Weekly,  R    Comments: while on enoxaparin therapy.    12/11/18 1918   12/12/18 0500  Hemoglobin A1c  Tomorrow morning,   R     12/11/18 1918   12/12/18 0500  Lipid panel  Tomorrow morning,   R    Comments: Fasting    12/11/18 1918   12/12/18 0500  CBC  Tomorrow morning,   R     12/11/18 1918   12/12/18 0500  Comprehensive  metabolic panel  Tomorrow morning,   R     12/11/18 1918   12/12/18 0500  Ferritin  Tomorrow morning,   R     12/11/18 1918   12/12/18 0500  Iron and TIBC  Tomorrow morning,   R     12/11/18 1918   12/12/18 0500  Vitamin B12  Tomorrow morning,   R     12/11/18 1918   12/12/18 0500  Folate RBC  Tomorrow morning,   R     12/11/18 1918   12/12/18 0500  Protein electrophoresis, serum  Tomorrow morning,   R     12/11/18 1918   12/11/18 1918  UPEP/UIFE/Light Chains/TP, 24-Hr Ur  Once,   STAT     12/11/18 1918   12/11/18 1852  D-dimer, quantitative (not at [facility])  Add-on,   AD     12/11/18 1851   12/11/18 1601  Culture, blood (routine x 2)  BLOOD CULTURE X 2,   STAT     12/11/18 1601   12/11/18 1601  Urinalysis, Routine w reflex microscopic  ONCE - STAT,   STAT     12/11/18 1601   12/11/18 1601  Urine culture  ONCE - STAT,   STAT     12/11/18 1601          Vitals/Pain Today's Vitals   12/11/18 1845 12/11/18 2000 12/11/18 2030 12/11/18 2045  BP: (!) 170/91 (!) 167/77 (!) 174/86   Pulse: (!) 131 (!) 126 (!) 126 (!) 125  Resp: (!) 27 (!) 29 19 20   SpO2: 96% 94% 97% 97%    Isolation Precautions No active isolations  Medications Medications   stroke: mapping our early stages of recovery book (has no administration in time range)  acetaminophen (TYLENOL) tablet 650 mg (has no administration in time range)    Or  acetaminophen (TYLENOL) solution 650 mg (has no administration in time range)    Or  acetaminophen (TYLENOL) suppository 650 mg (has no administration in time range)  enoxaparin (LOVENOX) injection 30 mg (has no administration in time range)  aspirin suppository 300 mg (300 mg Rectal Given 12/11/18 2051)    Or  aspirin tablet 325 mg ( Oral See Alternative 12/11/18 2051)  0.45 % sodium chloride infusion ( Intravenous New Bag/Given 12/11/18 2055)  sodium chloride flush (NS) 0.9 % injection 3 mL (3 mLs Intravenous Given 12/11/18 1604)  iohexol (OMNIPAQUE) 350 MG/ML injection  100 mL (100 mLs Intravenous Contrast Given 12/11/18 1544)  sodium chloride 0.9 % bolus 1,000 mL (0 mLs Intravenous Stopped 12/11/18 2040)    Mobility non-ambulatory High fall risk   Focused Assessments Neuro Assessment Handoff:  Swallow screen pass? No  Cardiac Rhythm: Sinus tachycardia NIH Stroke Scale ( + Modified Stroke Scale Criteria)  Interval: Initial Level of Consciousness (1a.)   : Not alert, but arousable by minor stimulation to obey, answer, or respond LOC Questions (1b. )   +: Answers neither question correctly LOC Commands (1c. )   + : Performs both tasks correctly  Best Gaze (2. )  +: Normal Visual (3. )  +: Complete hemianopia Facial Palsy (4. )    : Normal symmetrical movements Motor Arm, Left (5a. )   +: Drift Motor Arm, Right (5b. )   +: No effort against gravity Motor Leg, Left (6a. )   +: Some effort against gravity Motor Leg, Right (6b. )   +: No effort against gravity Limb Ataxia (7. ): Absent Sensory (8. )   +: Normal, no sensory loss Best Language (9. )   +: Severe aphasia Dysarthria (10. ): Severe dysarthria, patient's speech is so slurred as to be unintelligible in the absence of or out of proportion to any dysphasia, or is mute/anarthric Extinction/Inattention (11.)   +: No Abnormality Modified SS Total  +: 15 Complete NIHSS TOTAL: 18 Last date known well: 12/11/18 Last time known well: 1000 Neuro Assessment: Exceptions to WDL Neuro Checks:   Initial (12/11/18 1600)  Last Documented NIHSS Modified Score: 15 (12/11/18 1600) Has TPA been given? No If patient is a Neuro Trauma and patient is going to OR before floor call report to [place] nurse: [phone] or [phone]     R Recommendations: See Admitting Provider Note  Report given to:   Additional Notes: n/a

## 2018-12-11 NOTE — Procedures (Signed)
ELECTROENCEPHALOGRAM REPORT   Patient: Julian Tyler       Room #: 032C EEG No. ID: [id number] Age: 73 y.o.        Sex: male Referring Physician: Erlinda Hong Report Date:  12/11/2018        Interpreting Physician: Alexis Goodell  History: Julian Tyler is an 73 y.o. male with seizure-like activity  Medications:  None  Conditions of Recording:  This is a 21 channel routine scalp EEG performed with bipolar and monopolar montages arranged in accordance to the international 10/20 system of electrode placement. One channel was dedicated to EKG recording.  The patient is in the awake state.  Description:  The background activity is slow and poorly organized.  The posterior background rhythm reaches a maximum of 6 Hz theta and is low voltage.  It is seen over both hemispheres.  This activity becomes more rhythmic when the patient has his shaking episodes which appears to be related to artifact.  The activity in the central and temporal regions shows evidence of faster frequencies but remains poorly organized.   Despite the patient having multiple episodes with upper extremity shaking and moaning, no epileptiform activity is noted.  Hyperventilation and intermittent photic stimulation were not performed.  IMPRESSION: This is an abnormal EEG secondary to general background slowing.  This finding may be seen with a diffuse disturbance that is etiologically nonspecific, but may include a metabolic encephalopathy, among other possibilities.  Despite the patient having multiple episodes with upper extremity shaking and moaning, no epileptiform activity is noted.    Alexis Goodell, MD Neurology [phone] 12/11/2018, 5:32 PM

## 2018-12-11 NOTE — ED Notes (Signed)
Brother- Broadus John, would like an update at [phone]

## 2018-12-11 NOTE — H&P (Signed)
TRH H&P    Patient Demographics:    Julian Tyler, is a 73 y.o. male  MRN: [id number]  DOB - 1946-05-04  Admit Date - 12/11/2018  Referring MD/NP/PA:  Aletta Edouard  Outpatient Primary MD for the patient is Puschinsky, Fransico Him., MD  Patient coming from:   [facility]  Chief complaint-  Altered mental status    HPI:    Julian Tyler  is a 73 y.o. male, w hypertension, hyperlipidemia, dm2, h/o stroke , seizure do, dementia, apparently presents with altered mental status.  Per nurse, pt had right side deficit , not able to walk by self.  ? Slight slurred speech.  Pt recently had seroquel , xanax, and depakote added due to combativeness.  Pt was more difficult to arouse this am, ?aspirated liquid. Pt noted to have left facial droop and left arm not moving and thus code stroke called and brought to ER  Pt recently admitted to [facility] for partial seizure on 11/02/18, had slurred speech , w/up negative for stroke.  Pt loaded on Keppra, EEG negative for seizure.  Pt was discharged on keppra and vimpat along with aspirin and statin 11/22/18 to [facility]    In Ed T 97 P 131, R 27, Bp 170/91, pox 96% Wt 119.1kg  Wbc 14.2, hgb 10.2, Plt 264,  Na 157, K 5.1,  Bun 70, Creatinine 1.83 Alb 2.4 Ast 34, Alt 20 Trop 12 Lactic acid 2.2 INR 1.4, PTT 30  CXR pending Urinalysis pending  Pt given NS 1L iv x1  Pt will be admitted for altered mental status, ? Left side weakness r/o CVA. , as well as ARF, lactic acidosis,  secondary to metformin in the setting of ARF.      Review of systems:    In addition to the HPI above,  No Fever-chills, No Headache, No changes with Vision or hearing, No problems swallowing food or Liquids, No Chest pain, Cough or Shortness of Breath, No Abdominal pain, No Nausea or Vomiting, bowel movements are regular, No Blood in stool or Urine, No dysuria, No new skin rashes or  bruises, No new joints pains-aches,   No recent weight gain or loss, No polyuria, polydypsia or polyphagia, No significant Mental Stressors.  All other systems reviewed and are negative.    Past History of the following :    Past Medical History:  Diagnosis Date  . Cataract   . Chorioretinal scar of left eye   . Diabetes (Julian Tyler)   . Dry eyes   . Glaucoma   . HLD (hyperlipidemia)   . HTN (hypertension)   . Stroke [facility])       No past surgical history on file.    Social History:      Social History   Tobacco Use  . Smoking status: Never Smoker  . Smokeless tobacco: Current User    Types: Chew  Substance Use Topics  . Alcohol use: Yes    Comment: "some beer"       Family History :    No  family history on file. Unable to obtain due to AMS   Home Medications:   Prior to Admission medications   Medication Sig Start Date End Date Taking? Authorizing Provider  ALPRAZolam Duanne Moron) 0.5 MG tablet Take 1 tablet (0.5 mg total) by mouth 2 (two) times daily. 11/26/18  Yes Hennie Duos, MD  amLODipine (NORVASC) 10 MG tablet Take 10 mg by mouth daily.   Yes [provider]  aspirin EC 81 MG tablet Take 81 mg by mouth daily.   Yes [provider]  cloNIDine (CATAPRES - DOSED IN MG/24 HR) 0.3 mg/24hr patch Place 1 patch onto the skin once a week last placed on 11/18/2018   Yes [provider]  divalproex (DEPAKOTE) 250 MG DR tablet Take 250 mg by mouth 2 (two) times daily.    Yes [provider]  dorzolamide (TRUSOPT) 2 % ophthalmic solution Place 1 drop into both eyes 2 (two) times daily.   Yes [provider]  ferrous sulfate 325 (65 FE) MG tablet Take 325 mg by mouth daily with breakfast.   Yes [provider]  hydrALAZINE (APRESOLINE) 100 MG tablet Take 100 mg by mouth 2 (two) times daily.   Yes [provider]  Insulin Glargine (LANTUS SOLOSTAR) 100 UNIT/ML Solostar Pen Inject 30 Units into the skin at  bedtime.    Yes [provider]  insulin lispro (HUMALOG) 100 UNIT/ML cartridge Inject 5 units into skin three times a day with meals   Yes [provider]  Lacosamide (VIMPAT) 100 MG TABS Take 100 mg by mouth 2 (two) times a day.    Yes [provider]  lisinopril (ZESTRIL) 20 MG tablet Take 20 mg by mouth daily.   Yes [provider]  metFORMIN (GLUCOPHAGE-XR) 500 MG 24 hr tablet Take 500 mg by mouth daily with breakfast.   Yes [provider]  metoprolol succinate (TOPROL-XL) 25 MG 24 hr tablet Take 25 mg by mouth daily. For HTN   Yes [provider]  nicotine ([employer] NICOTINE) 21 mg/24hr patch Place 21 mg onto the skin daily. Place 1 patch onto skin daily Remove old patch prior to placement of new patch   Yes [provider]  potassium chloride (K-DUR) 10 MEQ tablet Take 10 mEq by mouth daily.   Yes [provider]  pravastatin (PRAVACHOL) 40 MG tablet Take 40 mg by mouth daily.   Yes [provider]  QUEtiapine (SEROQUEL) 50 MG tablet Take 50 mg by mouth 2 (two) times daily.    Yes [provider]  levETIRAcetam (KEPPRA) 1000 MG tablet Take 1,000 mg by mouth 2 (two) times daily.    [provider]  Melatonin 3 MG TABS Take 3 mg by mouth at bedtime as needed (sleep).     [provider]  Nutritional Supplement LIQD Take 120 mLs by mouth 2 (two) times a day.    [provider]     Allergies:     Allergies  Allergen Reactions  . Hctz [Hydrochlorothiazide] Swelling  . Sulfa Drugs Cross Reactors Swelling  . Zithromax [Azithromycin] Diarrhea  . Robaxin [Methocarbamol] Other (See Comments)    Per MAR     Physical Exam:   Vitals  Blood pressure (!) 170/91, pulse (!) 131, resp. rate (!) 27, SpO2 96 %.  1.  General: axox1  2. Psychiatric: euthymic  3. Neurologic: cn2-12 intact, reflexes 2+ symmetric, diffuse with no clonus, motor 5/5 in all 4 ext  4. HEENMT:  Anicteric, pupils 1.46m symmetric, direct, consensual , near intact Neck: no jvd, no bruit  5. Respiratory : CTAB  6. Cardiovascular : tachy s1, s2, no m/g/r  7. Gastrointestinal:  Abd: soft, nt, nd, +bs  8. Skin:  Ext: no c/c/e,   No rash  9.Musculoskeletal:  Good ROM  No adenopathy    Data Review:    CBC Recent Labs  Lab 12/11/18 1525 12/11/18 1600 12/11/18 1622  WBC  --  14.2*  --   HGB 11.6* 9.1* 10.2*  HCT 34.0* 32.3* 30.0*  PLT  --  264  --   MCV  --  83.0  --   MCH  --  23.4*  --   MCHC  --  28.2*  --   RDW  --  15.3  --   LYMPHSABS  --  1.6  --   MONOABS  --  1.6*  --   EOSABS  --  0.0  --   BASOSABS  --  0.1  --    ------------------------------------------------------------------------------------------------------------------  Results for orders placed or performed during the hospital encounter of 12/11/18 (from the past 48 hour(s))  I-stat chem 8, ED     Status: Abnormal   Collection Time: 12/11/18  3:25 PM  Result Value Ref Range   Sodium 158 (H) 135 - 145 mmol/L   Potassium 4.9 3.5 - 5.1 mmol/L   Chloride 129 (H) 98 - 111 mmol/L   BUN 67 (H) 8 - 23 mg/dL   Creatinine, Ser 1.50 (H) 0.61 - 1.24 mg/dL   Glucose, Bld 200 (H) 70 - 99 mg/dL   Calcium, Ion 1.24 1.15 - 1.40 mmol/L   TCO2 21 (L) 22 - 32 mmol/L   Hemoglobin 11.6 (L) 13.0 - 17.0 g/dL   HCT 34.0 (L) 39.0 - 52.0 %  Protime-INR     Status: Abnormal   Collection Time: 12/11/18  4:00 PM  Result Value Ref Range   Prothrombin Time 17.4 (H) 11.4 - 15.2 seconds   INR 1.4 (H) 0.8 - 1.2    Comment: (NOTE) INR goal varies based on device and disease states. Performed at [facility] [address] [place] [address]  APTT     Status: None   Collection Time: 12/11/18  4:00 PM  Result Value Ref Range   aPTT 30 24 - 36 seconds    Comment: Performed at [place] [address] [place] CBC     Status: Abnormal   Collection Time: 12/11/18   4:00 PM  Result Value Ref Range   WBC 14.2 (H) 4.0 - 10.5 K/uL   RBC 3.89 (L) 4.22 - 5.81 MIL/uL   Hemoglobin 9.1 (L) 13.0 - 17.0 g/dL   HCT 32.3 (L) 39.0 - 52.0 %   MCV 83.0 80.0 - 100.0 fL   MCH 23.4 (L) 26.0 - 34.0 pg   MCHC 28.2 (L) 30.0 - 36.0 g/dL   RDW 15.3 11.5 - 15.5 %   Platelets 264 150 - 400 K/uL   nRBC 0.0 0.0 - 0.2 %    Comment: [facility]formed at [facility] [address] [place] [id number] Differential     Status: Abnormal   Collection Time: 12/11/18  4:00 PM  Result Value Ref Range   Neutrophils Relative % 77 %   Neutro Abs 10.9 (H) 1.7 - 7.7 K/uL   Lymphocytes Relative 11 %   Lymphs Abs 1.6 0.7 -  4.0 K/uL   Monocytes Relative 11 %   Monocytes Absolute 1.6 (H) 0.1 - 1.0 K/uL   Eosinophils Relative 0 %   Eosinophils Absolute 0.0 0.0 - 0.5 K/uL   Basophils Relative 0 %   Basophils Absolute 0.1 0.0 - 0.1 K/uL   Immature Granulocytes 1 %   Abs Immature Granulocytes 0.07 0.00 - 0.07 K/uL    Comment: Performed at [facility], [place] [address], [place], [place] [address]  Comprehensive metabolic panel     Status: Abnormal   Collection Time: 12/11/18  4:00 PM  Result Value Ref Range   Sodium 155 (H) 135 - 145 mmol/L   Potassium 4.5 3.5 - 5.1 mmol/L   Chloride 125 (H) 98 - 111 mmol/L   CO2 19 (L) 22 - 32 mmol/L   Glucose, Bld 199 (H) 70 - 99 mg/dL   BUN 70 (H) 8 - 23 mg/dL   Creatinine, Ser 1.83 (H) 0.61 - 1.24 mg/dL   Calcium 9.5 8.9 - 10.3 mg/dL   Total Protein 8.0 6.5 - 8.1 g/dL   Albumin 2.4 (L) 3.5 - 5.0 g/dL   AST 34 15 - 41 U/L   ALT 20 0 - 44 U/L   Alkaline Phosphatase 53 38 - 126 U/L   Total Bilirubin 1.0 0.3 - 1.2 mg/dL   GFR calc non Af Amer 36 (L) >60 mL/min   GFR calc Af Amer 42 (L) >60 mL/min   Anion gap 11 5 - 15    Comment: Performed at [facility], [place] [address] [place], [place] [address]  I-stat chem 8, ed     Status: Abnormal   Collection Time: 12/11/18  4:22 PM  Result Value Ref Range   Sodium 157 (H) 135 -  145 mmol/L   Potassium 5.1 3.5 - 5.1 mmol/L   Chloride 127 (H) 98 - 111 mmol/L   BUN 79 (H) 8 - 23 mg/dL   Creatinine, Ser 1.60 (H) 0.61 - 1.24 mg/dL   Glucose, Bld 198 (H) 70 - 99 mg/dL   Calcium, Ion 1.24 1.15 - 1.40 mmol/L   TCO2 24 22 - 32 mmol/L   Hemoglobin 10.2 (L) 13.0 - 17.0 g/dL   HCT 30.0 (L) 39.0 - 52.0 %  Troponin I (High Sensitivity)     Status: None   Collection Time: 12/11/18  4:45 PM  Result Value Ref Range   Troponin I (High Sensitivity) 12 <18 ng/L    Comment: (NOTE) Elevated high sensitivity troponin I (hsTnI) values and significant  changes across serial measurements may suggest ACS but many other  chronic and acute conditions are known to elevate hsTnI results.  Refer to the "Links" section for chest pain algorithms and additional  guidance. Performed at [facility], [place] [address] [place], [place] [address]   SARS Coronavirus 2 (CEPHEID - Performed in [place] hospital lab), Hosp Order     Status: None   Collection Time: 12/11/18  5:00 PM   Specimen: Nasopharyngeal Swab  Result Value Ref Range   SARS Coronavirus 2 NEGATIVE NEGATIVE    Comment: (NOTE) If result is NEGATIVE SARS-CoV-2 target nucleic acids are NOT DETECTED. The SARS-CoV-2 RNA is generally detectable in upper and lower  respiratory specimens during the acute phase of infection. The lowest  concentration of SARS-CoV-2 viral copies this assay can detect is 250  copies / mL. A negative result does not preclude SARS-CoV-2 infection  and should not be used as the  sole basis for treatment or other  patient management decisions.  A negative result may occur with  improper specimen collection / handling, submission of specimen other  than nasopharyngeal swab, presence of viral mutation(s) within the  areas targeted by this assay, and inadequate number of viral copies  (<250 copies / mL). A negative result must be combined with clinical  observations, patient history, and epidemiological  information. If result is POSITIVE SARS-CoV-2 target nucleic acids are DETECTED. The SARS-CoV-2 RNA is generally detectable in upper and lower  respiratory specimens dur ing the acute phase of infection.  Positive  results are indicative of active infection with SARS-CoV-2.  Clinical  correlation with patient history and other diagnostic information is  necessary to determine patient infection status.  Positive results do  not rule out bacterial infection or co-infection with other viruses. If result is PRESUMPTIVE POSTIVE SARS-CoV-2 nucleic acids MAY BE PRESENT.   A presumptive positive result was obtained on the submitted specimen  and confirmed on repeat testing.  While 2019 novel coronavirus  (SARS-CoV-2) nucleic acids may be present in the submitted sample  additional confirmatory testing may be necessary for epidemiological  and / or clinical management purposes  to differentiate between  SARS-CoV-2 and other Sarbecovirus currently known to infect humans.  If clinically indicated additional testing with an alternate test  methodology [phone]) is advised. The SARS-CoV-2 RNA is generally  detectable in upper and lower respiratory sp ecimens during the acute  phase of infection. The expected result is Negative. Fact Sheet for Patients:  StrictlyIdeas.no Fact Sheet for Healthcare Providers: BankingDealers.co.za This test is not yet approved or cleared by the [place] FDA and has been authorized for detection and/or diagnosis of SARS-CoV-2 by FDA under an Emergency Use Authorization (EUA).  This EUA will remain in effect (meaning this test can be used) for the duration of the COVID-19 declaration under Section 564(b)(1) of the Act, 21 U.S.C. section 360bbb-3(b)(1), unless the authorization is terminated or revoked sooner. Performed at [facility], [place] [address], [place], [place] [address]   Lactic acid, plasma      Status: Abnormal   Collection Time: 12/11/18  5:00 PM  Result Value Ref Range   Lactic Acid, Venous 2.2 (HH) 0.5 - 1.9 mmol/L    Comment: CRITICAL RESULT CALLED TO, READ BACK BY AND VERIFIED WITH: R.KENNEDY RN [id number] 12/11/2018 MCCORMICK K Performed at [facility], [place] [address] [place], [place] [address]   Lactic acid, plasma     Status: Abnormal   Collection Time: 12/11/18  6:02 PM  Result Value Ref Range   Lactic Acid, Venous 2.3 (HH) 0.5 - 1.9 mmol/L    Comment: CRITICAL RESULT CALLED TO, READ BACK BY AND VERIFIED WITH: R.KENNEDY RN [id number] 12/11/2018 MCCORMICK K Performed at [place] [address] [place],  [address]   CBG monitoring, ED     Status: Abnormal   Collection Time: 12/11/18  6:46 PM  Result Value Ref Range   Glucose-Capillary 190 (H) 70 - 99 mg/dL    Chemistries  Recent Labs  Lab 12/11/18 1525 12/11/18 1600 12/11/18 1622  NA 158* 155* 157*  K 4.9 4.5 5.1  CL 129* 125* 127*  CO2  --  19*  --   GLUCOSE 200* 199* 198*  BUN 67* 70* 79*  CREATININE 1.50* 1.83* 1.60*  CALCIUM  --  9.5  --   AST  --  34  --   ALT  --  20  --   ALKPHOS  --  53  --   BILITOT  --  1.0  --    ------------------------------------------------------------------------------------------------------------------  ------------------------------------------------------------------------------------------------------------------ GFR: Estimated Creatinine Clearance: 52.4 mL/min (A) (by C-G formula based on SCr of 1.6 mg/dL (H)). Liver Function Tests: Recent Labs  Lab 12/11/18 1600  AST 34  ALT 20  ALKPHOS 53  BILITOT 1.0  PROT 8.0  ALBUMIN 2.4*   No results for input(s): LIPASE, AMYLASE in the last 168 hours. No results for input(s): AMMONIA in the last 168 hours. Coagulation Profile: Recent Labs  Lab 12/11/18 1600  INR 1.4*   Cardiac Enzymes: No results for input(s): CKTOTAL, CKMB, CKMBINDEX, TROPONINI in the last 168 hours. BNP (last 3 results)  No results for input(s): PROBNP in the last 8760 hours. HbA1C: No results for input(s): HGBA1C in the last 72 hours. CBG: Recent Labs  Lab 12/11/18 1846  GLUCAP 190*   Lipid Profile: No results for input(s): CHOL, HDL, LDLCALC, TRIG, CHOLHDL, LDLDIRECT in the last 72 hours. Thyroid Function Tests: No results for input(s): TSH, T4TOTAL, FREET4, T3FREE, THYROIDAB in the last 72 hours. Anemia Panel: No results for input(s): VITAMINB12, FOLATE, FERRITIN, TIBC, IRON, RETICCTPCT in the last 72 hours.  --------------------------------------------------------------------------------------------------------------- Urine analysis: No results found for: COLORURINE, APPEARANCEUR, LABSPEC, PHURINE, GLUCOSEU, HGBUR, BILIRUBINUR, KETONESUR, PROTEINUR, UROBILINOGEN, NITRITE, LEUKOCYTESUR    Imaging Results:    Ct Angio Head W Or Wo Contrast  Result Date: 12/11/2018 CLINICAL DATA:  Left facial droop and left arm weakness. EXAM: CT ANGIOGRAPHY HEAD AND NECK CT PERFUSION BRAIN TECHNIQUE: Multidetector CT imaging of the head and neck was performed using the standard protocol during bolus administration of intravenous contrast. Multiplanar CT image reconstructions and MIPs were obtained to evaluate the vascular anatomy. Carotid stenosis measurements (when applicable) are obtained utilizing NASCET criteria, using the distal internal carotid diameter as the denominator. Multiphase CT imaging of the brain was performed following IV bolus contrast injection. Subsequent parametric perfusion maps were calculated using RAPID software. CONTRAST:  165m OMNIPAQUE IOHEXOL 350 MG/ML SOLN COMPARISON:  Brain MRI 11/07/2018.  No prior angiographic imaging. FINDINGS: CTA NECK FINDINGS Aortic arch: Standard 3 vessel aortic arch with mild-to-moderate atherosclerotic plaque. No significant arch vessel origin stenosis. Right carotid system: Patent with mild-to-moderate predominantly calcified plaque in the distal common and  proximal internal carotid arteries without evidence of significant stenosis or dissection. Left carotid system: Patent with extensive calcified plaque in the mid and distal common carotid artery resulting in 55% stenosis. Mild calcified plaque in the proximal ICA without significant stenosis. Vertebral arteries: Patent and dominant right vertebral artery with calcified plaque at its origin not resulting in significant stenosis. Diminutive left vertebral artery with severe origin stenosis and poor visualization of the remainder of the V1 segment due to venous contrast. Patent but diffusely small and irregular left V2 and V3 segments. Skeleton: Moderate cervical spondylosis. Other neck: No evidence of cervical lymphadenopathy or mass. Upper chest: Clear lung apices. Review of the MIP images confirms the above findings CTA HEAD FINDINGS Anterior circulation: The internal carotid arteries are patent from skull base to carotid termini with extensive calcified plaque resulting in mild cavernous and moderate supraclinoid stenoses bilaterally. ACAs and MCAs are patent without evidence of proximal branch occlusion or significant A1 or M1 stenosis. There are prominent branch vessel atherosclerotic changes including multiple severe M2 and A2 stenoses. No aneurysm is identified. Posterior circulation: The intracranial right vertebral artery is patent with mild atherosclerotic irregularity but no significant stenosis.  The left V4 segment is occluded distal to the PICA origin, likely chronic based on the prior MRI. The basilar artery is patent with mild atherosclerotic irregularity but no flow limiting stenosis. Patent SCA is are seen bilaterally. There is a small right posterior communicating artery. Both PCAs are patent with diffuse atherosclerotic irregularity. There are mild right P1 and severe mid right P2 stenoses. No aneurysm is identified. Venous sinuses: As permitted by contrast timing, patent. Anatomic variants: None.  Review of the MIP images confirms the above findings CT Brain Perfusion Findings: ASPECTS: 10 CBF (<30%) Volume: 82m Perfusion (Tmax>6.0s) volume: 0227mMismatch Volume: 27m103mnfarction Location:None IMPRESSION: 1. No emergent large vessel occlusion. 2. 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 3. Patent right vertebral artery without significant stenosis. 4. 55% left common carotid artery stenosis. 5. Advanced intracranial atherosclerosis including moderate bilateral ICA stenoses and multiple severe anterior and posterior circulation branch vessel stenoses. 6. Negative CT perfusion imaging. These results were communicated to Dr. Xu Erlinda Hong 4:00 pmon 12/11/2018 by text page via the AM[facility]ssaging system. Electronically Signed   By: AllLogan BoresD.   On: 12/11/2018 17:58   Ct Angio Neck W Or Wo Contrast  Result Date: 12/11/2018 CLINICAL DATA:  Left facial droop and left arm weakness. EXAM: CT ANGIOGRAPHY HEAD AND NECK CT PERFUSION BRAIN TECHNIQUE: Multidetector CT imaging of the head and neck was performed using the standard protocol during bolus administration of intravenous contrast. Multiplanar CT image reconstructions and MIPs were obtained to evaluate the vascular anatomy. Carotid stenosis measurements (when applicable) are obtained utilizing NASCET criteria, using the distal internal carotid diameter as the denominator. Multiphase CT imaging of the brain was performed following IV bolus contrast injection. Subsequent parametric perfusion maps were calculated using RAPID software. CONTRAST:  1027m38mNIPAQUE IOHEXOL 350 MG/ML SOLN COMPARISON:  Brain MRI 11/07/2018.  No prior angiographic imaging. FINDINGS: CTA NECK FINDINGS Aortic arch: Standard 3 vessel aortic arch with mild-to-moderate atherosclerotic plaque. No significant arch vessel origin stenosis. Right carotid system: Patent with mild-to-moderate predominantly calcified plaque in the distal common and proximal internal  carotid arteries without evidence of significant stenosis or dissection. Left carotid system: Patent with extensive calcified plaque in the mid and distal common carotid artery resulting in 55% stenosis. Mild calcified plaque in the proximal ICA without significant stenosis. Vertebral arteries: Patent and dominant right vertebral artery with calcified plaque at its origin not resulting in significant stenosis. Diminutive left vertebral artery with severe origin stenosis and poor visualization of the remainder of the V1 segment due to venous contrast. Patent but diffusely small and irregular left V2 and V3 segments. Skeleton: Moderate cervical spondylosis. Other neck: No evidence of cervical lymphadenopathy or mass. Upper chest: Clear lung apices. Review of the MIP images confirms the above findings CTA HEAD FINDINGS Anterior circulation: The internal carotid arteries are patent from skull base to carotid termini with extensive calcified plaque resulting in mild cavernous and moderate supraclinoid stenoses bilaterally. ACAs and MCAs are patent without evidence of proximal branch occlusion or significant A1 or M1 stenosis. There are prominent branch vessel atherosclerotic changes including multiple severe M2 and A2 stenoses. No aneurysm is identified. Posterior circulation: The intracranial right vertebral artery is patent with mild atherosclerotic irregularity but no significant stenosis. The left V4 segment is occluded distal to the PICA origin, likely chronic based on the prior MRI. The basilar artery is patent with mild atherosclerotic irregularity but no flow limiting stenosis. Patent SCA is are seen bilaterally.  There is a small right posterior communicating artery. Both PCAs are patent with diffuse atherosclerotic irregularity. There are mild right P1 and severe mid right P2 stenoses. No aneurysm is identified. Venous sinuses: As permitted by contrast timing, patent. Anatomic variants: None. Review of the MIP  images confirms the above findings CT Brain Perfusion Findings: ASPECTS: 10 CBF (<30%) Volume: 869m Perfusion (Tmax>6.0s) volume: 044mMismatch Volume: 69m77mnfarction Location:None IMPRESSION: 1. No emergent large vessel occlusion. 2. 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 3. Patent right vertebral artery without significant stenosis. 4. 55% left common carotid artery stenosis. 5. Advanced intracranial atherosclerosis including moderate bilateral ICA stenoses and multiple severe anterior and posterior circulation branch vessel stenoses. 6. Negative CT perfusion imaging. These results were communicated to Dr. Xu Erlinda Hong 4:00 pmon 12/11/2018 by text page via the AMIBapt[facility] Jackson[facility]essaging system. Electronically Signed   By: AllLogan BoresD.   On: 12/11/2018 17:58   Ct Cerebral Perfusion W Contrast  Result Date: 12/11/2018 CLINICAL DATA:  Left facial droop and left arm weakness. EXAM: CT ANGIOGRAPHY HEAD AND NECK CT PERFUSION BRAIN TECHNIQUE: Multidetector CT imaging of the head and neck was performed using the standard protocol during bolus administration of intravenous contrast. Multiplanar CT image reconstructions and MIPs were obtained to evaluate the vascular anatomy. Carotid stenosis measurements (when applicable) are obtained utilizing NASCET criteria, using the distal internal carotid diameter as the denominator. Multiphase CT imaging of the brain was performed following IV bolus contrast injection. Subsequent parametric perfusion maps were calculated using RAPID software. CONTRAST:  1069m65mNIPAQUE IOHEXOL 350 MG/ML SOLN COMPARISON:  Brain MRI 11/07/2018.  No prior angiographic imaging. FINDINGS: CTA NECK FINDINGS Aortic arch: Standard 3 vessel aortic arch with mild-to-moderate atherosclerotic plaque. No significant arch vessel origin stenosis. Right carotid system: Patent with mild-to-moderate predominantly calcified plaque in the distal common and proximal internal carotid arteries  without evidence of significant stenosis or dissection. Left carotid system: Patent with extensive calcified plaque in the mid and distal common carotid artery resulting in 55% stenosis. Mild calcified plaque in the proximal ICA without significant stenosis. Vertebral arteries: Patent and dominant right vertebral artery with calcified plaque at its origin not resulting in significant stenosis. Diminutive left vertebral artery with severe origin stenosis and poor visualization of the remainder of the V1 segment due to venous contrast. Patent but diffusely small and irregular left V2 and V3 segments. Skeleton: Moderate cervical spondylosis. Other neck: No evidence of cervical lymphadenopathy or mass. Upper chest: Clear lung apices. Review of the MIP images confirms the above findings CTA HEAD FINDINGS Anterior circulation: The internal carotid arteries are patent from skull base to carotid termini with extensive calcified plaque resulting in mild cavernous and moderate supraclinoid stenoses bilaterally. ACAs and MCAs are patent without evidence of proximal branch occlusion or significant A1 or M1 stenosis. There are prominent branch vessel atherosclerotic changes including multiple severe M2 and A2 stenoses. No aneurysm is identified. Posterior circulation: The intracranial right vertebral artery is patent with mild atherosclerotic irregularity but no significant stenosis. The left V4 segment is occluded distal to the PICA origin, likely chronic based on the prior MRI. The basilar artery is patent with mild atherosclerotic irregularity but no flow limiting stenosis. Patent SCA is are seen bilaterally. There is a small right posterior communicating artery. Both PCAs are patent with diffuse atherosclerotic irregularity. There are mild right P1 and severe mid right P2 stenoses. No aneurysm is identified. Venous sinuses: As permitted by contrast timing, patent. Anatomic  variants: None. Review of the MIP images confirms  the above findings CT Brain Perfusion Findings: ASPECTS: 10 CBF (<30%) Volume: 32m Perfusion (Tmax>6.0s) volume: 067mMismatch Volume: 71m83mnfarction Location:None IMPRESSION: 1. No emergent large vessel occlusion. 2. 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 3. Patent right vertebral artery without significant stenosis. 4. 55% left common carotid artery stenosis. 5. Advanced intracranial atherosclerosis including moderate bilateral ICA stenoses and multiple severe anterior and posterior circulation branch vessel stenoses. 6. Negative CT perfusion imaging. These results were communicated to Dr. Xu Erlinda Hong 4:00 pmon 12/11/2018 by text page via the [facility] system. Electronically Signed   By: AllLogan BoresD.   On: 12/11/2018 17:58   Ct Head Code Stroke Wo Contrast  Result Date: 12/11/2018 CLINICAL DATA:  Code stroke. Left facial droop and left arm weakness. EXAM: CT HEAD WITHOUT CONTRAST TECHNIQUE: Contiguous axial images were obtained from the base of the skull through the vertex without intravenous contrast. COMPARISON:  Brain MRI 11/07/2018 FINDINGS: Brain: There is no evidence of acute infarct, intracranial hemorrhage, mass, midline shift, or extra-axial fluid collection. There is mild-to-moderate cerebral atrophy. Patchy to confluent cerebral white matter hypodensities are nonspecific but compatible with moderate chronic small vessel ischemic disease. Vascular: Calcified atherosclerosis at the skull base. No hyperdense vessel. Skull: No fracture or focal osseous lesion. Sinuses/Orbits: Mild mucosal thickening in the paranasal sinuses. Clear mastoid air cells. Unremarkable orbits. Other: None. ASPECTS ([facility] Early [facility] Score) - Ganglionic level infarction (caudate, lentiform nuclei, internal capsule, insula, M1-M3 cortex): 7 - Supraganglionic infarction (M4-M6 cortex): 3 Total score (0-10 with 10 being normal): 10 IMPRESSION: 1. No evidence of acute  intracranial abnormality. 2. ASPECTS is 10. 3. 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 These results were called by telephone at the time of interpretation on 12/11/2018 at 3:45 p.m. to Dr. Xu,Erlinda Hongho verbally acknowledged these results. Electronically Signed   By: AllLogan BoresD.   On: 12/11/2018 17:27       Assessment & Plan:    Active Problems:   Diabetes mellitus type 2, uncontrolled (HCC)   Severe protein-calorie malnutrition (HCC)   Anemia   Hypernatremia   ARF (acute renal failure) (HCC)   Tachycardia   Altered mental status   Lactic acidosis  AMS, left sided weakness r/o CVA/TIA MRI brain Check EEG Carotid ultrasound Cardiac echo CXR Check hga1c, lipid Check B12, Esr, Ana, Tsh, Rpr  H/o seizure do Cont Keppra Cont Vimpat  Tachycardia Tele Trop I q2h x2 TSH Check d dimer, if positive then VQ scan  ARF Likely secondary to dehydration Check urinalysis, urine sodium, urine creatinine, urine eosinophils STOP Lisinopril  Hypernatremia secondary to dehydration Hydrate with 1/2 ns at 77m52mr hour  Anemia, unclear what baseline Check ferritin, iron, tibc, b12, folate, spep, immunofixation  Dm2 STOP Metformin due to ARF and lactic acidosis Cont Lantus Fsbs ac and qhs, ISS  Hypertension STOP lisinopril  As above Cont Hydralazine Cont Toprol XL 25mg82mqday  Hyperlipidemia Cont Pravastatin 471mg 98mday  Dementia Cont Seroquel 571mg p56may   DVT Prophylaxis-   Lovenox - SCDs   AM Labs Ordered, also please review Full Orders  Family Communication: Admission, patients condition and plan of care including tests being ordered have been discussed with the patient   who indicate understanding and agree with the plan and Code Status.  Code Status:   FULL CODE, spoke with son and informed him of admission  Admission status: Observation/: Based on patients clinical presentation and evaluation of above clinical data, I have made  determination that patient meets observation criteria at this time.  Time spent in minutes : 70   Jani Gravel M.D on 12/11/2018 at 6:54 PM

## 2018-12-11 NOTE — Consult Note (Signed)
Stroke Neurology Consultation Note  Consult Requested by: Dr. Melina Copa  Reason for Consult: code stroke  Consult Date: 12/11/18  The history was obtained from the EMS and [facility].  During history and examination, all items were not able to obtain unless otherwise noted.  History of Present Illness:  Julian Tyler is a 73 y.o. 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  Pt was recently admitted to [facility] for partial seizure. He had slurry speech and speaking difficulty admitted on 11/02/18. Stroke work up ruled out acute CVA but found to right arm numbess and right facial twitching along with disorientation. Loaded with Keppra. On 11/06/18 have seizure with facial twitching, right gaze and AMS. vimpat added. EEG showed no seizure on 11/06/18. He developed delirium in the setting of underlying dementia. Psych consulted and he was put on scheduled haldol and later changed to risperdone with ativan and haldol PRN. He was discharged with keppra and vimpat along with ASA and statin on 11/22/18 to [facility].  As per nurse in [facility], pt had right side deficit, not able to walk by himself. Normally able to open eyes and answer questions, although slurry but able to talk. After answering questions, most time he fell back to sleep. However, for the last week, he had agitation and more combative and kept trying to get out of bed, seroquel, xanax and depakote were added. Yesterday, he became more combative. However, this morning, he had difficulty to arouse, aspirated on liquid, his seizure medication was on hold. At 10 am, he was hard to arouse, not answer questions, found to have some left facial droop and left arm not moving as before. Code stroke called and he was brought in by EMS to ED for evaluation.   He had uncontrolled DM. He presented to [facility]  on 11/02/18 with glucose 648 and A1C 12.7. he was continued insulin and metformin on discharge. He had difficulty with HTN control, on amlodipine, hydralazine, Lisinopril, metoprolol and clonidine patch.   LSN: unclear, could be yesterday tPA Given: No: unclear time onset  Past Medical History:  Diagnosis Date  . Cataract   . Chorioretinal scar of left eye   . Diabetes ([place])   . Dry eyes   . Glaucoma   . HLD (hyperlipidemia)   . HTN (hypertension)   . Stroke [facility])     No past surgical history on file.  No family history on file.  Social History:  reports that he has never smoked. His smokeless tobacco use includes chew. He reports current alcohol use. No history on file for drug.  Allergies:  Allergies  Allergen Reactions  . Hctz [Hydrochlorothiazide] Swelling  . Sulfa Drugs Cross Reactors Swelling  . Zithromax [Azithromycin] Diarrhea  . Robaxin [Methocarbamol] Other (See Comments)    Per MAR    No current facility-administered medications on file prior to encounter.    Current Outpatient Medications on File Prior to Encounter  Medication Sig Dispense Refill  . ALPRAZolam (XANAX) 0.5 MG tablet Take 1 tablet (0.5 mg total) by mouth 2 (two) times daily. 60 tablet 0  . amLODipine (NORVASC) 10 MG tablet Take 10 mg by mouth daily.    Marland Kitchen aspirin EC 81 MG tablet Take 81 mg by mouth daily.    . cloNIDine (CATAPRES - DOSED IN MG/24 HR) 0.3 mg/24hr patch Place 1 patch onto the skin once a  week last placed on 11/18/2018    . divalproex (DEPAKOTE) 250 MG DR tablet Take 250 mg by mouth 2 (two) times daily.     . dorzolamide (TRUSOPT) 2 % ophthalmic solution Place 1 drop into both eyes 2 (two) times daily.    . ferrous sulfate 325 (65 FE) MG tablet Take 325 mg by mouth daily with breakfast.    . hydrALAZINE (APRESOLINE) 100 MG tablet Take 100 mg by mouth 2 (two) times daily.    . Insulin Glargine (LANTUS SOLOSTAR) 100 UNIT/ML Solostar Pen Inject 30 Units into the skin at bedtime.     .  insulin lispro (HUMALOG) 100 UNIT/ML cartridge Inject 5 units into skin three times a day with meals    . Lacosamide (VIMPAT) 100 MG TABS Take 100 mg by mouth 2 (two) times a day.     . lisinopril (ZESTRIL) 20 MG tablet Take 20 mg by mouth daily.    . metFORMIN (GLUCOPHAGE-XR) 500 MG 24 hr tablet Take 500 mg by mouth daily with breakfast.    . metoprolol succinate (TOPROL-XL) 25 MG 24 hr tablet Take 25 mg by mouth daily. For HTN    . nicotine ([employer] NICOTINE) 21 mg/24hr patch Place 21 mg onto the skin daily. Place 1 patch onto skin daily Remove old patch prior to placement of new patch    . potassium chloride (K-DUR) 10 MEQ tablet Take 10 mEq by mouth daily.    . pravastatin (PRAVACHOL) 40 MG tablet Take 40 mg by mouth daily.    . QUEtiapine (SEROQUEL) 50 MG tablet Take 50 mg by mouth 2 (two) times daily.     . Melatonin 3 MG TABS Take 3 mg by mouth at bedtime as needed (sleep).       Review of Systems: A full ROS was attempted today and was not able to be performed.    Physical Examination:   General - morbidly obese, well developed, obtunded    Ophthalmologic - fundi not visualized due to noncooperation.    Cardiovascular - regular rhythm, however tachycardia, heart rate 130s  Neuro - obtunded, able to briefly open eyes on voice, not following commands, moaning with pain summation, nonverbal.  With forced eye opening, eyes mid position, doll's eyes present, not blinking to visual threat, not tracking bilaterally.  No significant facial droop.  Tongue protrusion not corporative.  On pain stimulation, mild withdraw of right upper extremity, briefly against gravity left upper extremity, no movement of left lower extremity, however mild withdrawal of right lower extremity but not against gravity.  No Babinski bilaterally. Sensation, coordination and gait not tested.   Data Reviewed: No results found.  Assessment: 73 y.o. male with PMH of dementia with behavior changes, CVA affecting right  side (1980s),hypertension, DM2insulin dependent, recent admission for focal seizure, morbid obesity presented as code stroke. He was on metformin and insulin for diabetes, 5 BP meds for hypertension, Keppra and Vimpat for seizure, and Seroquel Depakote and Xanax for dementia behavior changes.  Recently developed agitation and combative, was put on Xanax Seroquel and Depakote.  This morning become more drowsy sleepy, with questionable left facial droop and left arm weakness.  In ER, patient obtunded with brief against gravity LUE and not moving LLE with bilateral arm shivering R>L.  CT no acute abnormality.  CTA head and neck and CT perfusion no LVO or perfusion deficit.  Patient not a candidate for TPA given unclear time onset and it was more than 4 and half hours.  Stat EEG no seizure, bilateral arm shivering not correlating with seizure activity.  Recommend MRI to rule out stroke, continue encephalopathy/delirium work-up.  Heart rate was high, recommend EKG and troponin monitoring.  Stroke Risk Factors - diabetes mellitus, hyperlipidemia, hypertension and smoking  Plan: - Recommend admission for further work-up of encephalopathy/delirium, DDX including stroke, medication effect, aspiration pneumonia, ACS with tachycardia - MRI brain to rule out stroke - Continue Keppra and Vimpat for seizure control  - Continue aspirin and pravastatin for stroke prevention - PT consult, OT consult, Speech consult - Echocardiogram - Follow-up with serial troponin - Risk factor modification - Telemetry monitoring - neuro checks - will follow  Thank you for this consultation and allowing [place] to participate in the care of this patient.  Rosalin Hawking, MD PhD Stroke Neurology 12/11/2018 4:50 PM

## 2018-12-11 NOTE — Progress Notes (Signed)
Location:  [facility] Room Number: 507-P Place of Service:  SNF ([phone])  Provider:Annr D Shadee Rathod MD  Puschinsky, Fransico Him., MD  Patient Care Team: Puschinsky, Fransico Him., MD as PCP - General (General Surgery)  Extended Emergency Contact Information Primary Emergency Contact: Scheier,Vivian Address: [address]          [place], [place] [address] [place] Phone: [phone] Relation: Spouse    Allergies: Hctz [hydrochlorothiazide], Sulfa drugs cross reactors, Zithromax [azithromycin], and Robaxin [methocarbamol]  Chief Complaint  Patient presents with  . Acute Visit    Patient seen for new onset stroke    HPI: Patient is a 73 y.o. male who nursing asked me to see for left-sided weakness facial weakness and left arm weakness.  Physical therapy is also in the room with me and they state over the last several days patient has not been able to transfer and do the things that he had been doing last week.  He does seem weaker on his left side.  Patient does have right-sided weakness from stroke from the 1980s.  Patient speech is garbled.  Patient has required some assistance with eating and drinking which is new from his arrival here.  Today he could not swallow his medications, which is concerning considering he is on seizure medications.  Per nursing patient did not give him any of his medication that would make him lethargic or sleepy today.  Past Medical History:  Diagnosis Date  . Cataract   . Chorioretinal scar of left eye   . Diabetes ([place])   . Dry eyes   . Glaucoma   . HLD (hyperlipidemia)   . HTN (hypertension)   . Stroke [facility])     History reviewed. No pertinent surgical history.  Allergies as of 12/11/2018      Reactions   Hctz [hydrochlorothiazide] Swelling   Sulfa Drugs Cross Reactors Swelling   Zithromax [azithromycin] Diarrhea   Robaxin [methocarbamol] Other (See Comments)   Per [facility]      Medication List        Accurate as of December 11, 2018  5:13 PM. If you have any questions, ask your nurse or doctor.        ALPRAZolam 0.5 MG tablet Commonly known as: Xanax Take 1 tablet (0.5 mg total) by mouth 2 (two) times daily.   amLODipine 10 MG tablet Commonly known as: NORVASC Take 10 mg by mouth daily.   aspirin EC 81 MG tablet Take 81 mg by mouth daily.   cloNIDine 0.3 mg/24hr patch Commonly known as: CATAPRES - Dosed in mg/24 hr Place 1 patch onto the skin once a week last placed on 11/18/2018   [employer]S Nicotine 21 mg/24hr patch Generic drug: nicotine Place 21 mg onto the skin daily. Place 1 patch onto skin daily Remove old patch prior to placement of new patch   divalproex 250 MG DR tablet Commonly known as: DEPAKOTE Take 250 mg by mouth 2 (two) times daily.   dorzolamide 2 % ophthalmic solution Commonly known as: TRUSOPT Place 1 drop into both eyes 2 (two) times daily.   ferrous sulfate 325 (65 FE) MG tablet Take 325 mg by mouth daily with breakfast.   HumaLOG 100 UNIT/ML cartridge Generic drug: insulin lispro Inject 5 units into skin three times a day with meals   hydrALAZINE 100 MG tablet Commonly known as: APRESOLINE Take 100 mg by mouth 2 (two) times daily.   Lantus SoloStar 100  UNIT/ML Solostar Pen Generic drug: Insulin Glargine Inject 30 Units into the skin at bedtime.   levETIRAcetam 1000 MG tablet Commonly known as: KEPPRA Take 1,000 mg by mouth 2 (two) times daily.   lisinopril 20 MG tablet Commonly known as: ZESTRIL Take 20 mg by mouth daily.   Melatonin 3 MG Tabs Take 3 mg by mouth at bedtime as needed (sleep).   metFORMIN 500 MG 24 hr tablet Commonly known as: GLUCOPHAGE-XR Take 500 mg by mouth daily with breakfast.   metoprolol succinate 25 MG 24 hr tablet Commonly known as: TOPROL-XL Take 25 mg by mouth daily. For HTN   Nutritional Supplement Liqd Take 120 mLs by mouth 2 (two) times a day.   potassium chloride 10 MEQ tablet Commonly known as:  K-DUR Take 10 mEq by mouth daily.   pravastatin 40 MG tablet Commonly known as: PRAVACHOL Take 40 mg by mouth daily.   QUEtiapine 50 MG tablet Commonly known as: SEROQUEL Take 50 mg by mouth 2 (two) times daily.   Vimpat 100 MG Tabs Generic drug: Lacosamide Take 100 mg by mouth 2 (two) times a day.       No orders of the defined types were placed in this encounter.   Immunization History  Administered Date(s) Administered  . Pneumococcal Conjugate-13 11/22/2018    Social History   Tobacco Use  . Smoking status: Never Smoker  . Smokeless tobacco: Current User    Types: Chew  Substance Use Topics  . Alcohol use: Yes    Comment: "some beer"    Review of Systems    unable to obtain secondary to mental status; nursing- as per history of present illness    Vitals:   12/11/18 1653  BP: (!) 160/85  Pulse: 98  Resp: 18  Temp: (!) 97 F (36.1 C)  SpO2: 96%   Body mass index is 39.93 kg/m. Physical Exam  GENERAL APPEARANCE: Alert, conversant, No acute distress  SKIN: No diaphoresis rash HEENT: Unremarkable RESPIRATORY: Breathing is even, unlabored. Lung sounds are clear   CARDIOVASCULAR: Heart RRR no murmurs, rubs or gallops. No peripheral edema  GASTROINTESTINAL: Abdomen is soft, non-tender, not distended w/ normal bowel sounds.  GENITOURINARY: Bladder non tender, not distended  MUSCULOSKELETAL: No abnormal joints or musculature NEUROLOGIC: Slight left-sided facial weakness, dysarthria left arm weakness which is new from prior, baseline right arm weakness; patient does not move legs PSYCHIATRIC: Sleepy does not but does speak in response to questions, speech cannot be understood  Patient Active Problem List   Diagnosis Date Noted  . Encounter for family conference without patient present 12/01/2018  . Dysarthria 11/25/2018  . Expressive aphasia 11/25/2018  . Partial seizures ([place]) 11/25/2018  . Delirium 11/25/2018  . Dementia with behavioral problem ([place])  11/25/2018  . Diabetes mellitus type 2, uncontrolled ([place]) 11/25/2018  . Hypertension associated with diabetes ([place]) 11/25/2018  . Acute kidney injury ([place]) 11/25/2018  . Hyperlipidemia associated with type 2 diabetes mellitus ([place]) 11/25/2018  . Iron deficiency anemia 11/25/2018  . Tobacco abuse 11/25/2018    CMP     Component Value Date/Time   NA 157 (H) 12/11/2018 1622   K 5.1 12/11/2018 1622   CL 127 (H) 12/11/2018 1622   CO2 19 (L) 12/11/2018 1600   GLUCOSE 198 (H) 12/11/2018 1622   BUN 79 (H) 12/11/2018 1622   CREATININE 1.60 (H) 12/11/2018 1622   CALCIUM 9.5 12/11/2018 1600   PROT 8.0 12/11/2018 1600   ALBUMIN 2.4 (L) 12/11/2018 1600  AST 34 12/11/2018 1600   ALT 20 12/11/2018 1600   ALKPHOS 53 12/11/2018 1600   BILITOT 1.0 12/11/2018 1600   GFRNONAA 36 (L) 12/11/2018 1600   GFRAA 42 (L) 12/11/2018 1600   Recent Labs    12/11/18 1525 12/11/18 1600 12/11/18 1622  NA 158* 155* 157*  K 4.9 4.5 5.1  CL 129* 125* 127*  CO2  --  19*  --   GLUCOSE 200* 199* 198*  BUN 67* 70* 79*  CREATININE 1.50* 1.83* 1.60*  CALCIUM  --  9.5  --    Recent Labs    12/11/18 1600  AST 34  ALT 20  ALKPHOS 53  BILITOT 1.0  PROT 8.0  ALBUMIN 2.4*   Recent Labs    12/11/18 1525 12/11/18 1600 12/11/18 1622  WBC  --  14.2*  --   NEUTROABS  --  10.9*  --   HGB 11.6* 9.1* 10.2*  HCT 34.0* 32.3* 30.0*  MCV  --  83.0  --   PLT  --  264  --    No results for input(s): CHOL, LDLCALC, TRIG in the last 8760 hours.  Invalid input(s): HCL No results found for: MICROALBUR No results found for: TSH No results found for: HGBA1C No results found for: CHOL, HDL, LDLCALC, LDLDIRECT, TRIG, CHOLHDL  Significant Diagnostic Results in last 30 days:  No results found.  Assessment and Plan  Concern for subacute stroke altered mental status//seizures/dementia- metabolic encephalopathy is another possibility- as apparently this is been coming on for several days and change began to be  noticed over the weekend; patient can no longer swallow per nursing, therefore he cannot take his pills, he can get IV fluids here but he cannot get seizure medications IV at SNF; therefore will be sending to the hospital for work-up and support    Time spent greater than 35 minutes Hennie Duos, MD

## 2018-12-11 NOTE — ED Notes (Signed)
EEG techs at bedside

## 2018-12-12 ENCOUNTER — Observation Stay (HOSPITAL_COMMUNITY): Payer: Medicare Other

## 2018-12-12 DIAGNOSIS — E870 Hyperosmolality and hypernatremia: Secondary | ICD-10-CM | POA: Diagnosis present

## 2018-12-12 DIAGNOSIS — Y929 Unspecified place or not applicable: Secondary | ICD-10-CM | POA: Diagnosis not present

## 2018-12-12 DIAGNOSIS — Z931 Gastrostomy status: Secondary | ICD-10-CM | POA: Diagnosis not present

## 2018-12-12 DIAGNOSIS — E860 Dehydration: Secondary | ICD-10-CM | POA: Diagnosis present

## 2018-12-12 DIAGNOSIS — D631 Anemia in chronic kidney disease: Secondary | ICD-10-CM | POA: Diagnosis not present

## 2018-12-12 DIAGNOSIS — T17908A Unspecified foreign body in respiratory tract, part unspecified causing other injury, initial encounter: Secondary | ICD-10-CM | POA: Diagnosis not present

## 2018-12-12 DIAGNOSIS — R1310 Dysphagia, unspecified: Secondary | ICD-10-CM | POA: Diagnosis not present

## 2018-12-12 DIAGNOSIS — R627 Adult failure to thrive: Secondary | ICD-10-CM | POA: Diagnosis present

## 2018-12-12 DIAGNOSIS — D649 Anemia, unspecified: Secondary | ICD-10-CM | POA: Diagnosis not present

## 2018-12-12 DIAGNOSIS — H409 Unspecified glaucoma: Secondary | ICD-10-CM | POA: Diagnosis present

## 2018-12-12 DIAGNOSIS — Z79890 Hormone replacement therapy (postmenopausal): Secondary | ICD-10-CM | POA: Diagnosis not present

## 2018-12-12 DIAGNOSIS — Z7189 Other specified counseling: Secondary | ICD-10-CM | POA: Diagnosis not present

## 2018-12-12 DIAGNOSIS — Z515 Encounter for palliative care: Secondary | ICD-10-CM | POA: Diagnosis not present

## 2018-12-12 DIAGNOSIS — R29810 Facial weakness: Secondary | ICD-10-CM | POA: Diagnosis present

## 2018-12-12 DIAGNOSIS — E785 Hyperlipidemia, unspecified: Secondary | ICD-10-CM | POA: Diagnosis present

## 2018-12-12 DIAGNOSIS — F0151 Vascular dementia with behavioral disturbance: Secondary | ICD-10-CM | POA: Diagnosis present

## 2018-12-12 DIAGNOSIS — Z7982 Long term (current) use of aspirin: Secondary | ICD-10-CM | POA: Diagnosis not present

## 2018-12-12 DIAGNOSIS — Z79899 Other long term (current) drug therapy: Secondary | ICD-10-CM | POA: Diagnosis not present

## 2018-12-12 DIAGNOSIS — I69351 Hemiplegia and hemiparesis following cerebral infarction affecting right dominant side: Secondary | ICD-10-CM | POA: Diagnosis not present

## 2018-12-12 DIAGNOSIS — E1165 Type 2 diabetes mellitus with hyperglycemia: Secondary | ICD-10-CM | POA: Diagnosis not present

## 2018-12-12 DIAGNOSIS — E43 Unspecified severe protein-calorie malnutrition: Secondary | ICD-10-CM | POA: Diagnosis present

## 2018-12-12 DIAGNOSIS — R4182 Altered mental status, unspecified: Secondary | ICD-10-CM | POA: Diagnosis not present

## 2018-12-12 DIAGNOSIS — N183 Chronic kidney disease, stage 3 (moderate): Secondary | ICD-10-CM | POA: Diagnosis not present

## 2018-12-12 DIAGNOSIS — G459 Transient cerebral ischemic attack, unspecified: Secondary | ICD-10-CM

## 2018-12-12 DIAGNOSIS — Z6841 Body Mass Index (BMI) 40.0 and over, adult: Secondary | ICD-10-CM | POA: Diagnosis not present

## 2018-12-12 DIAGNOSIS — T383X5A Adverse effect of insulin and oral hypoglycemic [antidiabetic] drugs, initial encounter: Secondary | ICD-10-CM | POA: Diagnosis present

## 2018-12-12 DIAGNOSIS — G40909 Epilepsy, unspecified, not intractable, without status epilepticus: Secondary | ICD-10-CM | POA: Diagnosis present

## 2018-12-12 DIAGNOSIS — N179 Acute kidney failure, unspecified: Secondary | ICD-10-CM | POA: Diagnosis present

## 2018-12-12 DIAGNOSIS — F17220 Nicotine dependence, chewing tobacco, uncomplicated: Secondary | ICD-10-CM | POA: Diagnosis present

## 2018-12-12 DIAGNOSIS — E119 Type 2 diabetes mellitus without complications: Secondary | ICD-10-CM | POA: Diagnosis not present

## 2018-12-12 DIAGNOSIS — T17908D Unspecified foreign body in respiratory tract, part unspecified causing other injury, subsequent encounter: Secondary | ICD-10-CM | POA: Diagnosis not present

## 2018-12-12 DIAGNOSIS — N390 Urinary tract infection, site not specified: Secondary | ICD-10-CM | POA: Diagnosis present

## 2018-12-12 DIAGNOSIS — F05 Delirium due to known physiological condition: Secondary | ICD-10-CM | POA: Diagnosis present

## 2018-12-12 DIAGNOSIS — Z794 Long term (current) use of insulin: Secondary | ICD-10-CM | POA: Diagnosis not present

## 2018-12-12 DIAGNOSIS — Z20828 Contact with and (suspected) exposure to other viral communicable diseases: Secondary | ICD-10-CM | POA: Diagnosis present

## 2018-12-12 DIAGNOSIS — I959 Hypotension, unspecified: Secondary | ICD-10-CM | POA: Diagnosis not present

## 2018-12-12 DIAGNOSIS — F0281 Dementia in other diseases classified elsewhere with behavioral disturbance: Secondary | ICD-10-CM

## 2018-12-12 DIAGNOSIS — I10 Essential (primary) hypertension: Secondary | ICD-10-CM | POA: Diagnosis present

## 2018-12-12 DIAGNOSIS — F0150 Vascular dementia without behavioral disturbance: Secondary | ICD-10-CM | POA: Diagnosis not present

## 2018-12-12 DIAGNOSIS — J9811 Atelectasis: Secondary | ICD-10-CM | POA: Diagnosis not present

## 2018-12-12 DIAGNOSIS — L89150 Pressure ulcer of sacral region, unstageable: Secondary | ICD-10-CM | POA: Diagnosis not present

## 2018-12-12 DIAGNOSIS — G20 Parkinson's disease: Secondary | ICD-10-CM

## 2018-12-12 DIAGNOSIS — G9341 Metabolic encephalopathy: Secondary | ICD-10-CM | POA: Diagnosis present

## 2018-12-12 DIAGNOSIS — R000 Tachycardia, unspecified: Secondary | ICD-10-CM | POA: Diagnosis present

## 2018-12-12 DIAGNOSIS — R569 Unspecified convulsions: Secondary | ICD-10-CM | POA: Diagnosis not present

## 2018-12-12 LAB — MRSA PCR SCREENING: MRSA by PCR: NEGATIVE

## 2018-12-12 LAB — CBC
HCT: 32.7 % — ABNORMAL LOW (ref 39.0–52.0)
Hemoglobin: 9.4 g/dL — ABNORMAL LOW (ref 13.0–17.0)
MCH: 23.7 pg — ABNORMAL LOW (ref 26.0–34.0)
MCHC: 28.7 g/dL — ABNORMAL LOW (ref 30.0–36.0)
MCV: 82.4 fL (ref 80.0–100.0)
Platelets: 276 10*3/uL (ref 150–400)
RBC: 3.97 MIL/uL — ABNORMAL LOW (ref 4.22–5.81)
RDW: 15.4 % (ref 11.5–15.5)
WBC: 12.7 10*3/uL — ABNORMAL HIGH (ref 4.0–10.5)
nRBC: 0.2 % (ref 0.0–0.2)

## 2018-12-12 LAB — GLUCOSE, CAPILLARY
Glucose-Capillary: 187 mg/dL — ABNORMAL HIGH (ref 70–99)
Glucose-Capillary: 189 mg/dL — ABNORMAL HIGH (ref 70–99)
Glucose-Capillary: 189 mg/dL — ABNORMAL HIGH (ref 70–99)
Glucose-Capillary: 199 mg/dL — ABNORMAL HIGH (ref 70–99)
Glucose-Capillary: 221 mg/dL — ABNORMAL HIGH (ref 70–99)
Glucose-Capillary: 228 mg/dL — ABNORMAL HIGH (ref 70–99)

## 2018-12-12 LAB — ECHOCARDIOGRAM COMPLETE
Height: 68 in
Weight: 4201.6 oz

## 2018-12-12 LAB — BLOOD GAS, ARTERIAL
Acid-base deficit: 2.4 mmol/L — ABNORMAL HIGH (ref 0.0–2.0)
Bicarbonate: 22 mmol/L (ref 20.0–28.0)
Drawn by: 358491
FIO2: 28
O2 Saturation: 97.6 %
Patient temperature: 98.6
pCO2 arterial: 38.2 mmHg (ref 32.0–48.0)
pH, Arterial: 7.379 (ref 7.350–7.450)
pO2, Arterial: 105 mmHg (ref 83.0–108.0)

## 2018-12-12 LAB — URINALYSIS, ROUTINE W REFLEX MICROSCOPIC
Bilirubin Urine: NEGATIVE
Glucose, UA: NEGATIVE mg/dL
Ketones, ur: NEGATIVE mg/dL
Nitrite: NEGATIVE
Protein, ur: 30 mg/dL — AB
Specific Gravity, Urine: >1.046 — ABNORMAL HIGH (ref 1.005–1.030)
WBC, UA: >50 WBC/hpf — ABNORMAL HIGH (ref 0–5)
pH: 5 (ref 5.0–8.0)

## 2018-12-12 LAB — COMPREHENSIVE METABOLIC PANEL
ALT: 22 U/L (ref 0–44)
AST: 31 U/L (ref 15–41)
Albumin: 2.4 g/dL — ABNORMAL LOW (ref 3.5–5.0)
Alkaline Phosphatase: 62 U/L (ref 38–126)
Anion gap: 16 — ABNORMAL HIGH (ref 5–15)
BUN: 64 mg/dL — ABNORMAL HIGH (ref 8–23)
CO2: 18 mmol/L — ABNORMAL LOW (ref 22–32)
Calcium: 9.6 mg/dL (ref 8.9–10.3)
Chloride: 123 mmol/L — ABNORMAL HIGH (ref 98–111)
Creatinine, Ser: 1.63 mg/dL — ABNORMAL HIGH (ref 0.61–1.24)
GFR calc Af Amer: 48 mL/min — ABNORMAL LOW (ref >60)
GFR calc non Af Amer: 41 mL/min — ABNORMAL LOW (ref >60)
Glucose, Bld: 227 mg/dL — ABNORMAL HIGH (ref 70–99)
Potassium: 4.2 mmol/L (ref 3.5–5.1)
Sodium: 157 mmol/L — ABNORMAL HIGH (ref 135–145)
Total Bilirubin: 0.9 mg/dL (ref 0.3–1.2)
Total Protein: 8.3 g/dL — ABNORMAL HIGH (ref 6.5–8.1)

## 2018-12-12 LAB — TSH: TSH: 1.446 u[IU]/mL (ref 0.350–4.500)

## 2018-12-12 LAB — AMMONIA: Ammonia: 43 umol/L — ABNORMAL HIGH (ref 9–35)

## 2018-12-12 LAB — LIPID PANEL
Cholesterol: 144 mg/dL (ref 0–200)
HDL: 22 mg/dL — ABNORMAL LOW (ref >40)
LDL Cholesterol: 95 mg/dL (ref 0–99)
Total CHOL/HDL Ratio: 6.5 RATIO
Triglycerides: 133 mg/dL (ref <150)
VLDL: 27 mg/dL (ref 0–40)

## 2018-12-12 LAB — IRON AND TIBC
Iron: 21 ug/dL — ABNORMAL LOW (ref 45–182)
Saturation Ratios: 11 % — ABNORMAL LOW (ref 17.9–39.5)
TIBC: 192 ug/dL — ABNORMAL LOW (ref 250–450)
UIBC: 171 ug/dL

## 2018-12-12 LAB — FERRITIN: Ferritin: 4327 ng/mL — ABNORMAL HIGH (ref 24–336)

## 2018-12-12 LAB — SODIUM, URINE, RANDOM: Sodium, Ur: 20 mmol/L

## 2018-12-12 LAB — HEMOGLOBIN A1C
Hgb A1c MFr Bld: 6.1 % — ABNORMAL HIGH (ref 4.8–5.6)
Mean Plasma Glucose: 128.37 mg/dL

## 2018-12-12 LAB — PROTEIN / CREATININE RATIO, URINE
Creatinine, Urine: 121.82 mg/dL
Protein Creatinine Ratio: 0.41 mg/mg{Cre} — ABNORMAL HIGH (ref 0.00–0.15)
Total Protein, Urine: 50 mg/dL

## 2018-12-12 LAB — SEDIMENTATION RATE: Sed Rate: 129 mm/hr — ABNORMAL HIGH (ref 0–16)

## 2018-12-12 LAB — VITAMIN B12: Vitamin B-12: 330 pg/mL (ref 180–914)

## 2018-12-12 MED ORDER — POTASSIUM CHLORIDE CRYS ER 10 MEQ PO TBCR
10.0000 meq | EXTENDED_RELEASE_TABLET | Freq: Every day | ORAL | Status: DC
  Filled 2018-12-12 (×4): qty 1

## 2018-12-12 MED ORDER — TECHNETIUM TO 99M ALBUMIN AGGREGATED
1.5000 | Freq: Once | INTRAVENOUS | Status: AC | PRN
  Administered 2018-12-12: 1.5 via INTRAVENOUS

## 2018-12-12 MED ORDER — INSULIN ASPART 100 UNIT/ML ~~LOC~~ SOLN
0.0000 [IU] | SUBCUTANEOUS | Status: DC
  Administered 2018-12-12: 3 [IU] via SUBCUTANEOUS
  Administered 2018-12-12 (×2): 2 [IU] via SUBCUTANEOUS
  Administered 2018-12-12: 3 [IU] via SUBCUTANEOUS
  Administered 2018-12-12 (×2): 2 [IU] via SUBCUTANEOUS
  Administered 2018-12-13 (×5): 3 [IU] via SUBCUTANEOUS
  Administered 2018-12-13: 5 [IU] via SUBCUTANEOUS
  Administered 2018-12-14: 05:00:00 3 [IU] via SUBCUTANEOUS
  Administered 2018-12-14: 5 [IU] via SUBCUTANEOUS
  Administered 2018-12-14 (×3): 3 [IU] via SUBCUTANEOUS
  Administered 2018-12-14: 5 [IU] via SUBCUTANEOUS
  Administered 2018-12-15 (×2): 3 [IU] via SUBCUTANEOUS
  Administered 2018-12-15: 2 [IU] via SUBCUTANEOUS
  Administered 2018-12-15: 3 [IU] via SUBCUTANEOUS
  Administered 2018-12-15 (×2): 5 [IU] via SUBCUTANEOUS
  Administered 2018-12-15: 3 [IU] via SUBCUTANEOUS
  Administered 2018-12-16 (×2): 2 [IU] via SUBCUTANEOUS
  Administered 2018-12-16: 3 [IU] via SUBCUTANEOUS
  Administered 2018-12-16 – 2018-12-17 (×6): 2 [IU] via SUBCUTANEOUS
  Administered 2018-12-17: 05:00:00 1 [IU] via SUBCUTANEOUS
  Administered 2018-12-17 – 2018-12-18 (×3): 2 [IU] via SUBCUTANEOUS
  Administered 2018-12-18 (×2): 3 [IU] via SUBCUTANEOUS
  Administered 2018-12-18 – 2018-12-19 (×8): 2 [IU] via SUBCUTANEOUS
  Administered 2018-12-20: 1 [IU] via SUBCUTANEOUS
  Administered 2018-12-20 – 2018-12-22 (×12): 2 [IU] via SUBCUTANEOUS
  Administered 2018-12-22: 17:00:00 5 [IU] via SUBCUTANEOUS
  Administered 2018-12-22 (×3): 3 [IU] via SUBCUTANEOUS
  Administered 2018-12-22: 21:00:00 5 [IU] via SUBCUTANEOUS
  Administered 2018-12-22: 2 [IU] via SUBCUTANEOUS
  Administered 2018-12-23: 3 [IU] via SUBCUTANEOUS
  Administered 2018-12-23 (×2): 5 [IU] via SUBCUTANEOUS
  Administered 2018-12-23 (×2): 2 [IU] via SUBCUTANEOUS
  Administered 2018-12-24 (×2): 3 [IU] via SUBCUTANEOUS
  Administered 2018-12-24 (×3): 5 [IU] via SUBCUTANEOUS
  Administered 2018-12-24: 01:00:00 2 [IU] via SUBCUTANEOUS
  Administered 2018-12-25 (×5): 3 [IU] via SUBCUTANEOUS
  Administered 2018-12-25: 5 [IU] via SUBCUTANEOUS
  Administered 2018-12-26 – 2018-12-27 (×10): 2 [IU] via SUBCUTANEOUS
  Administered 2018-12-27: 1 [IU] via SUBCUTANEOUS
  Administered 2018-12-27: 2 [IU] via SUBCUTANEOUS
  Administered 2018-12-28: 1 [IU] via SUBCUTANEOUS
  Administered 2018-12-28 (×2): 2 [IU] via SUBCUTANEOUS
  Administered 2018-12-28: 1 [IU] via SUBCUTANEOUS
  Administered 2018-12-28: 2 [IU] via SUBCUTANEOUS
  Administered 2018-12-28 – 2018-12-30 (×6): 1 [IU] via SUBCUTANEOUS
  Administered 2018-12-30 – 2018-12-31 (×3): 2 [IU] via SUBCUTANEOUS
  Administered 2018-12-31 (×4): 1 [IU] via SUBCUTANEOUS
  Administered 2019-01-01 (×2): 2 [IU] via SUBCUTANEOUS
  Administered 2019-01-01: 3 [IU] via SUBCUTANEOUS
  Administered 2019-01-01: 1 [IU] via SUBCUTANEOUS
  Administered 2019-01-01 (×2): 2 [IU] via SUBCUTANEOUS
  Administered 2019-01-02: 1 [IU] via SUBCUTANEOUS
  Administered 2019-01-02: 2 [IU] via SUBCUTANEOUS
  Administered 2019-01-02: 3 [IU] via SUBCUTANEOUS
  Administered 2019-01-02 – 2019-01-06 (×24): 2 [IU] via SUBCUTANEOUS
  Administered 2019-01-06: 3 [IU] via SUBCUTANEOUS
  Administered 2019-01-06 (×2): 2 [IU] via SUBCUTANEOUS
  Administered 2019-01-07 – 2019-01-08 (×4): 1 [IU] via SUBCUTANEOUS
  Administered 2019-01-10: 2 [IU] via SUBCUTANEOUS
  Administered 2019-01-10 – 2019-01-11 (×3): 1 [IU] via SUBCUTANEOUS
  Administered 2019-01-11 (×5): 2 [IU] via SUBCUTANEOUS
  Administered 2019-01-12: 3 [IU] via SUBCUTANEOUS
  Administered 2019-01-12: 1 [IU] via SUBCUTANEOUS
  Administered 2019-01-12 – 2019-01-13 (×5): 2 [IU] via SUBCUTANEOUS
  Administered 2019-01-13: 1 [IU] via SUBCUTANEOUS
  Administered 2019-01-13 – 2019-01-14 (×4): 2 [IU] via SUBCUTANEOUS
  Administered 2019-01-14: 21:00:00 3 [IU] via SUBCUTANEOUS
  Administered 2019-01-14: 5 [IU] via SUBCUTANEOUS
  Administered 2019-01-14 (×3): 2 [IU] via SUBCUTANEOUS
  Administered 2019-01-15: 3 [IU] via SUBCUTANEOUS
  Administered 2019-01-15: 2 [IU] via SUBCUTANEOUS
  Administered 2019-01-15: 3 [IU] via SUBCUTANEOUS
  Administered 2019-01-15 (×2): 2 [IU] via SUBCUTANEOUS
  Administered 2019-01-15: 3 [IU] via SUBCUTANEOUS
  Administered 2019-01-16 (×3): 2 [IU] via SUBCUTANEOUS
  Administered 2019-01-16: 3 [IU] via SUBCUTANEOUS
  Administered 2019-01-16: 2 [IU] via SUBCUTANEOUS
  Administered 2019-01-16 – 2019-01-17 (×2): 3 [IU] via SUBCUTANEOUS
  Administered 2019-01-17 (×3): 2 [IU] via SUBCUTANEOUS
  Administered 2019-01-17: 13:00:00 3 [IU] via SUBCUTANEOUS
  Administered 2019-01-17: 2 [IU] via SUBCUTANEOUS
  Administered 2019-01-18: 3 [IU] via SUBCUTANEOUS
  Administered 2019-01-18 – 2019-01-19 (×10): 2 [IU] via SUBCUTANEOUS
  Administered 2019-01-20: 1 [IU] via SUBCUTANEOUS
  Administered 2019-01-20 – 2019-01-22 (×12): 2 [IU] via SUBCUTANEOUS
  Administered 2019-01-22: 1 [IU] via SUBCUTANEOUS
  Administered 2019-01-22 – 2019-01-23 (×10): 2 [IU] via SUBCUTANEOUS
  Administered 2019-01-24: 5 [IU] via SUBCUTANEOUS
  Administered 2019-01-24: 1 [IU] via SUBCUTANEOUS
  Administered 2019-01-24: 01:00:00 2 [IU] via SUBCUTANEOUS

## 2018-12-12 MED ORDER — DEXTROSE 5 % IV SOLN
INTRAVENOUS | Status: DC
  Administered 2018-12-12 – 2018-12-14 (×4): via INTRAVENOUS

## 2018-12-12 MED ORDER — PRAVASTATIN SODIUM 40 MG PO TABS: 40.0000 mg | ORAL_TABLET | Freq: Every day | ORAL | Status: DC

## 2018-12-12 MED ORDER — DORZOLAMIDE HCL 2 % OP SOLN
1.0000 [drp] | Freq: Two times a day (BID) | OPHTHALMIC | Status: DC
  Administered 2018-12-12 – 2019-01-24 (×86): 1 [drp] via OPHTHALMIC
  Filled 2018-12-12 (×2): qty 10

## 2018-12-12 MED ORDER — SODIUM CHLORIDE 0.9 % IV SOLN
1.0000 g | INTRAVENOUS | Status: DC
  Administered 2018-12-12 – 2018-12-14 (×3): 1 g via INTRAVENOUS
  Filled 2018-12-12 (×3): qty 10

## 2018-12-12 MED ORDER — VALPROATE SODIUM 500 MG/5ML IV SOLN
250.0000 mg | Freq: Two times a day (BID) | INTRAVENOUS | Status: DC
  Administered 2018-12-12 – 2018-12-14 (×4): 250 mg via INTRAVENOUS
  Filled 2018-12-12 (×5): qty 2.5

## 2018-12-12 MED ORDER — LEVETIRACETAM 500 MG PO TABS: 1000.0000 mg | ORAL_TABLET | Freq: Two times a day (BID) | ORAL | Status: DC

## 2018-12-12 MED ORDER — HYDRALAZINE HCL 50 MG PO TABS
100.0000 mg | ORAL_TABLET | Freq: Two times a day (BID) | ORAL | Status: DC
  Administered 2018-12-13: 100 mg via ORAL
  Filled 2018-12-12: qty 2

## 2018-12-12 MED ORDER — CHLORHEXIDINE GLUCONATE 0.12 % MT SOLN
15.0000 mL | Freq: Two times a day (BID) | OROMUCOSAL | Status: DC
  Administered 2018-12-12 – 2019-01-24 (×83): 15 mL via OROMUCOSAL
  Filled 2018-12-12 (×73): qty 15

## 2018-12-12 MED ORDER — ALPRAZOLAM 0.5 MG PO TABS
0.5000 mg | ORAL_TABLET | Freq: Two times a day (BID) | ORAL | Status: DC
  Administered 2018-12-13: 0.5 mg via ORAL
  Filled 2018-12-12: qty 1

## 2018-12-12 MED ORDER — PERFLUTREN LIPID MICROSPHERE
INTRAVENOUS | Status: AC
  Filled 2018-12-12: qty 10

## 2018-12-12 MED ORDER — LACOSAMIDE 50 MG PO TABS: 100.0000 mg | ORAL_TABLET | Freq: Two times a day (BID) | ORAL | Status: DC

## 2018-12-12 MED ORDER — AMLODIPINE BESYLATE 10 MG PO TABS: 10.0000 mg | ORAL_TABLET | Freq: Every day | ORAL | Status: DC

## 2018-12-12 MED ORDER — METOPROLOL TARTRATE 5 MG/5ML IV SOLN
5.0000 mg | Freq: Four times a day (QID) | INTRAVENOUS | Status: DC
  Administered 2018-12-12 – 2018-12-23 (×46): 5 mg via INTRAVENOUS
  Filled 2018-12-12 (×45): qty 5

## 2018-12-12 MED ORDER — SODIUM CHLORIDE 0.9 % IV SOLN
100.0000 mg | Freq: Two times a day (BID) | INTRAVENOUS | Status: DC
  Administered 2018-12-12 – 2018-12-23 (×22): 100 mg via INTRAVENOUS
  Filled 2018-12-12 (×26): qty 10

## 2018-12-12 MED ORDER — PERFLUTREN LIPID MICROSPHERE
1.0000 mL | INTRAVENOUS | Status: AC | PRN
  Administered 2018-12-12: 10 mL via INTRAVENOUS
  Filled 2018-12-12: qty 10

## 2018-12-12 MED ORDER — METOPROLOL SUCCINATE ER 25 MG PO TB24: 25.0000 mg | ORAL_TABLET | Freq: Every day | ORAL | Status: DC

## 2018-12-12 MED ORDER — DIVALPROEX SODIUM 250 MG PO DR TAB: 250.0000 mg | DELAYED_RELEASE_TABLET | Freq: Two times a day (BID) | ORAL | Status: DC

## 2018-12-12 MED ORDER — FERROUS SULFATE 325 (65 FE) MG PO TABS: 325.0000 mg | ORAL_TABLET | Freq: Every day | ORAL | Status: DC

## 2018-12-12 MED ORDER — DEXTROSE 5 % IV SOLN
INTRAVENOUS | Status: DC
  Administered 2018-12-12: 10:00:00 via INTRAVENOUS

## 2018-12-12 MED ORDER — LEVETIRACETAM IN NACL 1000 MG/100ML IV SOLN
1000.0000 mg | Freq: Two times a day (BID) | INTRAVENOUS | Status: DC
  Administered 2018-12-12 – 2018-12-23 (×23): 1000 mg via INTRAVENOUS
  Filled 2018-12-12 (×23): qty 100

## 2018-12-12 MED ORDER — ORAL CARE MOUTH RINSE
15.0000 mL | Freq: Two times a day (BID) | OROMUCOSAL | Status: DC
  Administered 2018-12-12 – 2019-01-24 (×79): 15 mL via OROMUCOSAL

## 2018-12-12 MED ORDER — MELATONIN 3 MG PO TABS
3.0000 mg | ORAL_TABLET | Freq: Every evening | ORAL | Status: DC | PRN
  Filled 2018-12-12: qty 1

## 2018-12-12 MED ORDER — NICOTINE 21 MG/24HR TD PT24
21.0000 mg | MEDICATED_PATCH | Freq: Every day | TRANSDERMAL | Status: DC
  Administered 2018-12-12 – 2019-01-16 (×35): 21 mg via TRANSDERMAL
  Filled 2018-12-12 (×35): qty 1

## 2018-12-12 NOTE — Progress Notes (Signed)
Inpatient Diabetes Program Recommendations  AACE/ADA: New Consensus Statement on Inpatient Glycemic Control (2015)  Target Ranges:  Prepandial:   less than 140 mg/dL      Peak postprandial:   less than 180 mg/dL (1-2 hours)      Critically ill patients:  140 - 180 mg/dL   Lab Results  Component Value Date   GLUCAP 189 (H) 12/12/2018    Review of Glycemic Control Results for Julian Tyler, Julian Tyler (MRN [id number]) as of 12/12/2018 16:15  Ref. Range 12/12/2018 00:20 12/12/2018 04:24 12/12/2018 09:10 12/12/2018 11:38 12/12/2018 15:51  Glucose-Capillary Latest Ref Range: 70 - 99 mg/dL 189 (H) 199 (H) 221 (H) 228 (H) 189 (H)   Diabetes history: DM 2 Outpatient Diabetes medications:  Lantus 30 units q HS, Humalog 5 units tid, Metformin 500 mg q breakfast Current orders for Inpatient glycemic control:  Novolog sensitive q 4 hours Inpatient Diabetes Program Recommendations:   Please add Lantus 15 units daily (this is 1/2 of home dose).   Thanks,  Adah Perl, RN, BC-ADM Inpatient Diabetes Coordinator Pager [phone] (8a-5p)

## 2018-12-12 NOTE — Progress Notes (Signed)
SLP Cancellation Note  Patient Details Name: Julian Tyler MRN: [id number] DOB: April 23, 1946   Cancelled treatment:       Reason Eval/Treat Not Completed: Fatigue/lethargy limiting ability to participate(Pt unable to demonstrate/maintain adequate alertness to participate in the evaluation. SLP will follow up on a subsequent date)  [place]. Hardin Negus, [place], [place] Office number [phone] Pager [place] 12/12/2018, 1:34 PM

## 2018-12-12 NOTE — Progress Notes (Signed)
  Echocardiogram 2D Echocardiogram has been performed.  [place] Deputy Vonzell Lindblad 12/12/2018, 8:57 AM

## 2018-12-12 NOTE — Progress Notes (Signed)
PROGRESS NOTE    Julian Tyler  SKA:[id number] DOB: September 09, 1945 DOA: 12/11/2018 PCP: Milana Na., MD    Brief Narrative:  73 year old male with a history of previous stroke, hypertension, diabetes, seizure disorder, dementia who is a resident of a skilled nursing facility, was brought to the hospital with worsening mental status.  Found to be significantly dehydrated, hypernatremic, with acute kidney injury.  Possible urinary tract infection, started on antibiotics.  He was also recently started on Seroquel, Xanax and Depakote for combativeness.  He has a history of seizures and is on Keppra and Vimpat.  Neurology followed him during his hospital stay.   Assessment & Plan:   Principal Problem:   Altered mental status Active Problems:   Diabetes mellitus type 2, uncontrolled (HCC)   Severe protein-calorie malnutrition (HCC)   Anemia   Hypernatremia   ARF (acute renal failure) (HCC)   Tachycardia   Lactic acidosis   AMS (altered mental status)   1. Acute metabolic encephalopathy.  Suspect this is related to dehydration and hypernatremia.  Urinalysis indicates possible infection, so he has been started on antibiotics.  Urine culture in process.  Ammonia is unremarkable.  PCO2 on blood gases also unrevealing.  Imaging of the brain did not show any acute findings and EEG does not show any epileptiform discharges.  Neurology following, now signed off. 2. Hypernatremia.  Related to decreased p.o. intake.  Continue gentle hydration with hypotonic fluids. 3. Shortness of breath.  Patient was noted to be somewhat tachypneic upon returning from radiology today.  Supplemental oxygen was placed.  Chest x-ray does not show any acute changes.  VQ scan was considered low probability for PE.  ABG was also unrevealing.  EKG did not show any acute changes.  Continue to monitor for now. 4. History of seizures.  Since patient is very lethargic and not able to take medications by mouth.  Will  change Keppra to IV.  He is also taking Depakote and Vimpat. 5. Acute kidney injury.  Creatinine was normal when discharged from [facility] last month.  Possibly related to dehydration.  Need to follow urine output as he is hydrated. 6. Diabetes.  Due to renal failure.  Continue on sliding scale insulin.  Blood sugar stable. 7. Hypertension.  Oral metoprolol on hold since he is unable to take by mouth.  Started on IV Lopressor. 8. Dementia, supportive care   DVT prophylaxis: Lovenox Code Status: Full code Family Communication: Attempted to reach wife over the phone on both numbers in chart, unable to reach anyone Disposition Plan: Return to skilled nursing facility on discharge   Consultants:   Neurology  Procedures:     Antimicrobials:   Ceftriaxone 7/1 >   Subjective: Does not answer questions with any coherent answers  Objective: Vitals:   12/12/18 1429 12/12/18 1548 12/12/18 2001 12/12/18 2009  BP: (!) 154/76 (!) 166/85 (!) 153/82   Pulse:  (!) 108 (!) 104   Resp: (!) 24 16 17 18   Temp: 98.1 F (36.7 C) 98.2 F (36.8 C) 98 F (36.7 C)   TempSrc: Axillary Axillary Oral   SpO2: 99% 100% 99%     Intake/Output Summary (Last 24 hours) at 12/12/2018 2048 Last data filed at 12/12/2018 1600 Gross per 24 hour  Intake 1092.05 ml  Output 1250 ml  Net -157.95 ml   There were no vitals filed for this visit.  Examination:  General exam: Somnolent, mildly increased respiratory rate Respiratory system: Clear to auscultation.  Mildly increased respiratory effort. Cardiovascular system: S1 & S2 heard, RRR. No JVD, murmurs, rubs, gallops or clicks. No pedal edema. Gastrointestinal system: Abdomen is nondistended, soft and nontender. No organomegaly or masses felt. Normal bowel sounds heard. Central nervous system: . No focal neurological deficits. Extremities: Symmetric  Skin: No rashes, lesions or ulcers Psychiatry: Somnolent, does not adequately  respond    Data Reviewed: I have personally reviewed following labs and imaging studies  CBC: Recent Labs  Lab 12/11/18 1525 12/11/18 1600 12/11/18 1622 12/12/18 1525  WBC  --  14.2*  --  12.7*  NEUTROABS  --  10.9*  --   --   HGB 11.6* 9.1* 10.2* 9.4*  HCT 34.0* 32.3* 30.0* 32.7*  MCV  --  83.0  --  82.4  PLT  --  264  --  672   Basic Metabolic Panel: Recent Labs  Lab 12/11/18 1525 12/11/18 1600 12/11/18 1622 12/12/18 0500  NA 158* 155* 157* 157*  K 4.9 4.5 5.1 4.2  CL 129* 125* 127* 123*  CO2  --  19*  --  18*  GLUCOSE 200* 199* 198* 227*  BUN 67* 70* 79* 64*  CREATININE 1.50* 1.83* 1.60* 1.63*  CALCIUM  --  9.5  --  9.6   GFR: Estimated Creatinine Clearance: 51.4 mL/min (A) (by C-G formula based on SCr of 1.63 mg/dL (H)). Liver Function Tests: Recent Labs  Lab 12/11/18 1600 12/12/18 0500  AST 34 31  ALT 20 22  ALKPHOS 53 62  BILITOT 1.0 0.9  PROT 8.0 8.3*  ALBUMIN 2.4* 2.4*   No results for input(s): LIPASE, AMYLASE in the last 168 hours. Recent Labs  Lab 12/12/18 1525  AMMONIA 43*   Coagulation Profile: Recent Labs  Lab 12/11/18 1600  INR 1.4*   Cardiac Enzymes: No results for input(s): CKTOTAL, CKMB, CKMBINDEX, TROPONINI in the last 168 hours. BNP (last 3 results) No results for input(s): PROBNP in the last 8760 hours. HbA1C: Recent Labs    12/12/18 1526  HGBA1C 6.1*   CBG: Recent Labs  Lab 12/12/18 0424 12/12/18 0910 12/12/18 1138 12/12/18 1551 12/12/18 1959  GLUCAP 199* 221* 228* 189* 187*   Lipid Profile: Recent Labs    12/12/18 0500  CHOL 144  HDL 22*  LDLCALC 95  TRIG 133  CHOLHDL 6.5   Thyroid Function Tests: Recent Labs    12/12/18 0500  TSH 1.446   Anemia Panel: Recent Labs    12/12/18 0500  VITAMINB12 330  FERRITIN 4,327*  TIBC 192*  IRON 21*   Sepsis Labs: Recent Labs  Lab 12/11/18 1700 12/11/18 1802  LATICACIDVEN 2.2* 2.3*    Recent Results (from the past 240 hour(s))  SARS Coronavirus  2 (CEPHEID - Performed in [place] hospital lab), Hosp Order     Status: None   Collection Time: 12/11/18  5:00 PM   Specimen: Nasopharyngeal Swab  Result Value Ref Range Status   SARS Coronavirus 2 NEGATIVE NEGATIVE Final    Comment: (NOTE) If result is NEGATIVE SARS-CoV-2 target nucleic acids are NOT DETECTED. The SARS-CoV-2 RNA is generally detectable in upper and lower  respiratory specimens during the acute phase of infection. The lowest  concentration of SARS-CoV-2 viral copies this assay can detect is 250  copies / mL. A negative result does not preclude SARS-CoV-2 infection  and should not be used as the sole basis for treatment or other  patient management decisions.  A negative result may occur with  improper specimen collection /  handling, submission of specimen other  than nasopharyngeal swab, presence of viral mutation(s) within the  areas targeted by this assay, and inadequate number of viral copies  (<250 copies / mL). A negative result must be combined with clinical  observations, patient history, and epidemiological information. If result is POSITIVE SARS-CoV-2 target nucleic acids are DETECTED. The SARS-CoV-2 RNA is generally detectable in upper and lower  respiratory specimens dur ing the acute phase of infection.  Positive  results are indicative of active infection with SARS-CoV-2.  Clinical  correlation with patient history and other diagnostic information is  necessary to determine patient infection status.  Positive results do  not rule out bacterial infection or co-infection with other viruses. If result is PRESUMPTIVE POSTIVE SARS-CoV-2 nucleic acids MAY BE PRESENT.   A presumptive positive result was obtained on the submitted specimen  and confirmed on repeat testing.  While 2019 novel coronavirus  (SARS-CoV-2) nucleic acids may be present in the submitted sample  additional confirmatory testing may be necessary for epidemiological  and / or clinical  management purposes  to differentiate between  SARS-CoV-2 and other Sarbecovirus currently known to infect humans.  If clinically indicated additional testing with an alternate test  methodology [phone]) is advised. The SARS-CoV-2 RNA is generally  detectable in upper and lower respiratory sp ecimens during the acute  phase of infection. The expected result is Negative. Fact Sheet for Patients:  StrictlyIdeas.no Fact Sheet for Healthcare Providers: BankingDealers.co.za This test is not yet approved or cleared by the [place] FDA and has been authorized for detection and/or diagnosis of SARS-CoV-2 by FDA under an Emergency Use Authorization (EUA).  This EUA will remain in effect (meaning this test can be used) for the duration of the COVID-19 declaration under Section 564(b)(1) of the Act, 21 U.S.C. section 360bbb-3(b)(1), unless the authorization is terminated or revoked sooner. Performed at [facility], [place] [address], [place], [place] [address]   Culture, blood (routine x 2)     Status: None (Preliminary result)   Collection Time: 12/11/18  5:00 PM   Specimen: BLOOD  Result Value Ref Range Status   Specimen Description BLOOD RIGHT ANTECUBITAL  Final   Special Requests   Final    BOTTLES DRAWN AEROBIC AND ANAEROBIC Blood Culture adequate volume   Culture   Final    NO GROWTH < 24 HOURS Performed at [facility], [place] [address], [place], [place] [address]    Report Status PENDING  Incomplete  Culture, blood (routine x 2)     Status: None (Preliminary result)   Collection Time: 12/11/18  5:58 PM   Specimen: BLOOD RIGHT HAND  Result Value Ref Range Status   Specimen Description BLOOD RIGHT HAND  Final   Special Requests   Final    BOTTLES DRAWN AEROBIC AND ANAEROBIC Blood Culture results may not be optimal due to an inadequate volume of blood received in culture bottles   Culture   Final    NO GROWTH < 24  HOURS Performed at [facility], [place] [address], [place], [place] [address]    Report Status PENDING  Incomplete  MRSA PCR Screening     Status: None   Collection Time: 12/12/18 12:28 AM   Specimen: Nasal Mucosa; Nasopharyngeal  Result Value Ref Range Status   MRSA by PCR NEGATIVE NEGATIVE Final    Comment:        The GeneXpert MRSA Assay (FDA approved for NASAL specimens only), is  one component of a comprehensive MRSA colonization surveillance program. It is not intended to diagnose MRSA infection nor to guide or monitor treatment for MRSA infections. Performed at [facility], [place] [address] [place], [place] [address]          Radiology Studies: Ct Angio Head W Or Wo Contrast  Result Date: 12/11/2018 CLINICAL DATA:  Left facial droop and left arm weakness. EXAM: CT ANGIOGRAPHY HEAD AND NECK CT PERFUSION BRAIN TECHNIQUE: Multidetector CT imaging of the head and neck was performed using the standard protocol during bolus administration of intravenous contrast. Multiplanar CT image reconstructions and MIPs were obtained to evaluate the vascular anatomy. Carotid stenosis measurements (when applicable) are obtained utilizing NASCET criteria, using the distal internal carotid diameter as the denominator. Multiphase CT imaging of the brain was performed following IV bolus contrast injection. Subsequent parametric perfusion maps were calculated using RAPID software. CONTRAST:  133mL OMNIPAQUE IOHEXOL 350 MG/ML SOLN COMPARISON:  Brain MRI 11/07/2018.  No prior angiographic imaging. FINDINGS: CTA NECK FINDINGS Aortic arch: Standard 3 vessel aortic arch with mild-to-moderate atherosclerotic plaque. No significant arch vessel origin stenosis. Right carotid system: Patent with mild-to-moderate predominantly calcified plaque in the distal common and proximal internal carotid arteries without evidence of significant stenosis or dissection. Left carotid system: Patent with extensive  calcified plaque in the mid and distal common carotid artery resulting in 55% stenosis. Mild calcified plaque in the proximal ICA without significant stenosis. Vertebral arteries: Patent and dominant right vertebral artery with calcified plaque at its origin not resulting in significant stenosis. Diminutive left vertebral artery with severe origin stenosis and poor visualization of the remainder of the V1 segment due to venous contrast. Patent but diffusely small and irregular left V2 and V3 segments. Skeleton: Moderate cervical spondylosis. Other neck: No evidence of cervical lymphadenopathy or mass. Upper chest: Clear lung apices. Review of the MIP images confirms the above findings CTA HEAD FINDINGS Anterior circulation: The internal carotid arteries are patent from skull base to carotid termini with extensive calcified plaque resulting in mild cavernous and moderate supraclinoid stenoses bilaterally. ACAs and MCAs are patent without evidence of proximal branch occlusion or significant A1 or M1 stenosis. There are prominent branch vessel atherosclerotic changes including multiple severe M2 and A2 stenoses. No aneurysm is identified. Posterior circulation: The intracranial right vertebral artery is patent with mild atherosclerotic irregularity but no significant stenosis. The left V4 segment is occluded distal to the PICA origin, likely chronic based on the prior MRI. The basilar artery is patent with mild atherosclerotic irregularity but no flow limiting stenosis. Patent SCA is are seen bilaterally. There is a small right posterior communicating artery. Both PCAs are patent with diffuse atherosclerotic irregularity. There are mild right P1 and severe mid right P2 stenoses. No aneurysm is identified. Venous sinuses: As permitted by contrast timing, patent. Anatomic variants: None. Review of the MIP images confirms the above findings CT Brain Perfusion Findings: ASPECTS: 10 CBF (<30%) Volume: 34mL Perfusion  (Tmax>6.0s) volume: 29mL Mismatch Volume: 56mL Infarction Location:None IMPRESSION: 1. No emergent large vessel occlusion. 2. 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 3. Patent right vertebral artery without significant stenosis. 4. 55% left common carotid artery stenosis. 5. Advanced intracranial atherosclerosis including moderate bilateral ICA stenoses and multiple severe anterior and posterior circulation branch vessel stenoses. 6. Negative CT perfusion imaging. These results were communicated to Dr. Erlinda Hong at 4:00 pmon 12/11/2018 by text page via the [facility] messaging system. Electronically Signed  By: Logan Bores M.D.   On: 12/11/2018 17:58   Ct Angio Neck W Or Wo Contrast  Result Date: 12/11/2018 CLINICAL DATA:  Left facial droop and left arm weakness. EXAM: CT ANGIOGRAPHY HEAD AND NECK CT PERFUSION BRAIN TECHNIQUE: Multidetector CT imaging of the head and neck was performed using the standard protocol during bolus administration of intravenous contrast. Multiplanar CT image reconstructions and MIPs were obtained to evaluate the vascular anatomy. Carotid stenosis measurements (when applicable) are obtained utilizing NASCET criteria, using the distal internal carotid diameter as the denominator. Multiphase CT imaging of the brain was performed following IV bolus contrast injection. Subsequent parametric perfusion maps were calculated using RAPID software. CONTRAST:  184mL OMNIPAQUE IOHEXOL 350 MG/ML SOLN COMPARISON:  Brain MRI 11/07/2018.  No prior angiographic imaging. FINDINGS: CTA NECK FINDINGS Aortic arch: Standard 3 vessel aortic arch with mild-to-moderate atherosclerotic plaque. No significant arch vessel origin stenosis. Right carotid system: Patent with mild-to-moderate predominantly calcified plaque in the distal common and proximal internal carotid arteries without evidence of significant stenosis or dissection. Left carotid system: Patent with extensive calcified  plaque in the mid and distal common carotid artery resulting in 55% stenosis. Mild calcified plaque in the proximal ICA without significant stenosis. Vertebral arteries: Patent and dominant right vertebral artery with calcified plaque at its origin not resulting in significant stenosis. Diminutive left vertebral artery with severe origin stenosis and poor visualization of the remainder of the V1 segment due to venous contrast. Patent but diffusely small and irregular left V2 and V3 segments. Skeleton: Moderate cervical spondylosis. Other neck: No evidence of cervical lymphadenopathy or mass. Upper chest: Clear lung apices. Review of the MIP images confirms the above findings CTA HEAD FINDINGS Anterior circulation: The internal carotid arteries are patent from skull base to carotid termini with extensive calcified plaque resulting in mild cavernous and moderate supraclinoid stenoses bilaterally. ACAs and MCAs are patent without evidence of proximal branch occlusion or significant A1 or M1 stenosis. There are prominent branch vessel atherosclerotic changes including multiple severe M2 and A2 stenoses. No aneurysm is identified. Posterior circulation: The intracranial right vertebral artery is patent with mild atherosclerotic irregularity but no significant stenosis. The left V4 segment is occluded distal to the PICA origin, likely chronic based on the prior MRI. The basilar artery is patent with mild atherosclerotic irregularity but no flow limiting stenosis. Patent SCA is are seen bilaterally. There is a small right posterior communicating artery. Both PCAs are patent with diffuse atherosclerotic irregularity. There are mild right P1 and severe mid right P2 stenoses. No aneurysm is identified. Venous sinuses: As permitted by contrast timing, patent. Anatomic variants: None. Review of the MIP images confirms the above findings CT Brain Perfusion Findings: ASPECTS: 10 CBF (<30%) Volume: 26mL Perfusion (Tmax>6.0s)  volume: 82mL Mismatch Volume: 36mL Infarction Location:None IMPRESSION: 1. No emergent large vessel occlusion. 2. 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 3. Patent right vertebral artery without significant stenosis. 4. 55% left common carotid artery stenosis. 5. Advanced intracranial atherosclerosis including moderate bilateral ICA stenoses and multiple severe anterior and posterior circulation branch vessel stenoses. 6. Negative CT perfusion imaging. These results were communicated to Dr. Erlinda Hong at 4:00 pmon 12/11/2018 by text page via the [facility] messaging system. Electronically Signed   By: Logan Bores M.D.   On: 12/11/2018 17:58   Mr Brain Wo Contrast  Result Date: 12/11/2018 CLINICAL DATA:  73 y/o  M; TIA, initial exam. EXAM: MRI HEAD WITHOUT CONTRAST TECHNIQUE: Axial DWI,  axial T2 FLAIR, axial T2 propeller, and axial SWI sequences were acquired. Patient was unable to continue and additional sequences were not acquired. COMPARISON:  None. FINDINGS: Brain: No reduced diffusion to suggest acute or early subacute infarction. Nonspecific confluent foci of T2 FLAIR hyperintense signal abnormality are present in subcortical and periventricular white matter compatible with moderate to severe chronic microvascular ischemic changes. There is moderate to severe volume loss the brain. No focal mass effect, extra-axial collection, hydrocephalus, or herniation identified. Motion degraded SWI sequence. Vascular: Normal flow voids. Skull and upper cervical spine: Normal marrow signal. Sinuses/Orbits: Negative. Other: None. IMPRESSION: Axial DWI, T2 FLAIR, T2, and SWI sequences were acquired. Motion degraded SWI sequence. 1. No acute stroke, mass effect, or extra-axial collection identified. 2. Moderate to severe chronic microvascular ischemic changes and volume loss of the brain. Electronically Signed   By: Kristine Garbe M.D.   On: 12/11/2018 22:42   Nm Pulmonary  Perfusion  Result Date: 12/12/2018 CLINICAL DATA:  Elevated D-dimer, mental status changes, question pulmonary embolism, history hypertension, diabetes mellitus EXAM: NUCLEAR MEDICINE PERFUSION LUNG SCAN TECHNIQUE: Perfusion images were obtained in multiple projections after intravenous injection of radiopharmaceutical. Ventilation scans intentionally deferred if perfusion scan and chest x-ray adequate for interpretation during COVID 19 epidemic. RADIOPHARMACEUTICALS:  1.5 mCi Tc-22m MAA IV COMPARISON:  None Correlation: Chest radiograph 12/11/2018 FINDINGS: Minimal diminished perfusion in LEFT lower lobe. No other perfusion abnormalities identified. Chest radiograph demonstrates LEFT basilar atelectasis, greater than suggested by perfusion scan. Findings represent a very low probability for pulmonary embolism. Ventilation exam not required. IMPRESSION: Very low probability for pulmonary embolism. Electronically Signed   By: Lavonia Dana M.D.   On: 12/12/2018 13:07   Ct Cerebral Perfusion W Contrast  Result Date: 12/11/2018 CLINICAL DATA:  Left facial droop and left arm weakness. EXAM: CT ANGIOGRAPHY HEAD AND NECK CT PERFUSION BRAIN TECHNIQUE: Multidetector CT imaging of the head and neck was performed using the standard protocol during bolus administration of intravenous contrast. Multiplanar CT image reconstructions and MIPs were obtained to evaluate the vascular anatomy. Carotid stenosis measurements (when applicable) are obtained utilizing NASCET criteria, using the distal internal carotid diameter as the denominator. Multiphase CT imaging of the brain was performed following IV bolus contrast injection. Subsequent parametric perfusion maps were calculated using RAPID software. CONTRAST:  126mL OMNIPAQUE IOHEXOL 350 MG/ML SOLN COMPARISON:  Brain MRI 11/07/2018.  No prior angiographic imaging. FINDINGS: CTA NECK FINDINGS Aortic arch: Standard 3 vessel aortic arch with mild-to-moderate atherosclerotic plaque.  No significant arch vessel origin stenosis. Right carotid system: Patent with mild-to-moderate predominantly calcified plaque in the distal common and proximal internal carotid arteries without evidence of significant stenosis or dissection. Left carotid system: Patent with extensive calcified plaque in the mid and distal common carotid artery resulting in 55% stenosis. Mild calcified plaque in the proximal ICA without significant stenosis. Vertebral arteries: Patent and dominant right vertebral artery with calcified plaque at its origin not resulting in significant stenosis. Diminutive left vertebral artery with severe origin stenosis and poor visualization of the remainder of the V1 segment due to venous contrast. Patent but diffusely small and irregular left V2 and V3 segments. Skeleton: Moderate cervical spondylosis. Other neck: No evidence of cervical lymphadenopathy or mass. Upper chest: Clear lung apices. Review of the MIP images confirms the above findings CTA HEAD FINDINGS Anterior circulation: The internal carotid arteries are patent from skull base to carotid termini with extensive calcified plaque resulting in mild cavernous and moderate supraclinoid stenoses bilaterally. ACAs  and MCAs are patent without evidence of proximal branch occlusion or significant A1 or M1 stenosis. There are prominent branch vessel atherosclerotic changes including multiple severe M2 and A2 stenoses. No aneurysm is identified. Posterior circulation: The intracranial right vertebral artery is patent with mild atherosclerotic irregularity but no significant stenosis. The left V4 segment is occluded distal to the PICA origin, likely chronic based on the prior MRI. The basilar artery is patent with mild atherosclerotic irregularity but no flow limiting stenosis. Patent SCA is are seen bilaterally. There is a small right posterior communicating artery. Both PCAs are patent with diffuse atherosclerotic irregularity. There are mild  right P1 and severe mid right P2 stenoses. No aneurysm is identified. Venous sinuses: As permitted by contrast timing, patent. Anatomic variants: None. Review of the MIP images confirms the above findings CT Brain Perfusion Findings: ASPECTS: 10 CBF (<30%) Volume: 98mL Perfusion (Tmax>6.0s) volume: 70mL Mismatch Volume: 26mL Infarction Location:None IMPRESSION: 1. No emergent large vessel occlusion. 2. 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 3. Patent right vertebral artery without significant stenosis. 4. 55% left common carotid artery stenosis. 5. Advanced intracranial atherosclerosis including moderate bilateral ICA stenoses and multiple severe anterior and posterior circulation branch vessel stenoses. 6. Negative CT perfusion imaging. These results were communicated to Dr. Erlinda Hong at 4:00 pmon 12/11/2018 by text page via the [facility] messaging system. Electronically Signed   By: Logan Bores M.D.   On: 12/11/2018 17:58   Dg Chest Port 1 View  Result Date: 12/12/2018 CLINICAL DATA:  Respiratory distress EXAM: PORTABLE CHEST 1 VIEW COMPARISON:  December 11, 2018 FINDINGS: Again identified are low lung volumes. There is probable atelectasis at the lung bases bilaterally. There are a few densities in the right mid lung zone favored to represent atelectasis. There may be small bilateral pleural effusions. There is no pneumothorax. The cardiac silhouette is stable from prior study. There is no acute osseous abnormality. IMPRESSION: Persistent low lung volumes with bibasilar atelectasis. No new focal airspace opacity that would be concerning for aspiration. Electronically Signed   By: Constance Holster M.D.   On: 12/12/2018 13:57   Dg Chest Port 1 View  Result Date: 12/11/2018 CLINICAL DATA:  73 year old male with weakness. EXAM: PORTABLE CHEST 1 VIEW COMPARISON:  CTA head and neck, CT perfusion today. FINDINGS: Portable AP semi upright view at 1853 hours. Somewhat low lung volumes. Normal  cardiac size and mediastinal contours. Visualized tracheal air column is within normal limits. Crowding of lung markings at the bases. Otherwise Allowing for portable technique the lungs are clear. No pneumothorax. Paucity of bowel gas. No acute osseous abnormality identified. IMPRESSION: Low lung volumes with mild atelectasis. Electronically Signed   By: Genevie Ann M.D.   On: 12/11/2018 19:21   Ct Head Code Stroke Wo Contrast  Result Date: 12/11/2018 CLINICAL DATA:  Code stroke. Left facial droop and left arm weakness. EXAM: CT HEAD WITHOUT CONTRAST TECHNIQUE: Contiguous axial images were obtained from the base of the skull through the vertex without intravenous contrast. COMPARISON:  Brain MRI 11/07/2018 FINDINGS: Brain: There is no evidence of acute infarct, intracranial hemorrhage, mass, midline shift, or extra-axial fluid collection. There is mild-to-moderate cerebral atrophy. Patchy to confluent cerebral white matter hypodensities are nonspecific but compatible with moderate chronic small vessel ischemic disease. Vascular: Calcified atherosclerosis at the skull base. No hyperdense vessel. Skull: No fracture or focal osseous lesion. Sinuses/Orbits: Mild mucosal thickening in the paranasal sinuses. Clear mastoid air cells. Unremarkable orbits. Other: None. ASPECTS (  [place] Stroke Program Early CT Score) - Ganglionic level infarction (caudate, lentiform nuclei, internal capsule, insula, M1-M3 cortex): 7 - Supraganglionic infarction (M4-M6 cortex): 3 Total score (0-10 with 10 being normal): 10 IMPRESSION: 1. No evidence of acute intracranial abnormality. 2. ASPECTS is 10. 3. 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 These results were called by telephone at the time of interpretation on 12/11/2018 at 3:45 p.m. to Dr. Erlinda Hong, who verbally acknowledged these results. Electronically Signed   By: Logan Bores M.D.   On: 12/11/2018 17:27        Scheduled Meds:  ALPRAZolam  0.5 mg Oral  BID   amLODipine  10 mg Oral Daily   aspirin  300 mg Rectal Daily   Or   aspirin  325 mg Oral Daily   chlorhexidine  15 mL Mouth Rinse BID   dorzolamide  1 drop Both Eyes BID   enoxaparin (LOVENOX) injection  30 mg Subcutaneous Q24H   ferrous sulfate  325 mg Oral Q breakfast   hydrALAZINE  100 mg Oral BID   insulin aspart  0-9 Units Subcutaneous Q4H   mouth rinse  15 mL Mouth Rinse q12n4p   metoprolol tartrate  5 mg Intravenous Q6H   nicotine  21 mg Transdermal Daily   potassium chloride  10 mEq Oral Daily   pravastatin  40 mg Oral q1800   Continuous Infusions:  dextrose 50 mL/hr at 12/12/18 1538   lacosamide (VIMPAT) IV     levETIRAcetam 1,000 mg (12/12/18 1336)   valproate sodium       LOS: 0 days    Time spent: 51mins    Kathie Dike, MD [facility]   If 7PM-7AM, please contact night-coverage [email or web address]  12/12/2018, 8:48 PM

## 2018-12-12 NOTE — Progress Notes (Signed)
Arrived to patients room for EEG - RN asked if we could come back later due to patient needing to go down for procedure - will check back as schedule permits.

## 2018-12-12 NOTE — Evaluation (Signed)
PT Cancellation Note  Patient Details Name: Julian Tyler MRN: [id number] DOB: 06/29/1945   Cancelled Treatment:    Reason Eval/Treat Not Completed: Patient at procedure or test/unavailable  Patient currently undergoing procedure. RN reports "EEG is coming next" Will continue to attempt PT eval as available.   Jeanie Cooks Racquel Arkin, PT 12/12/2018, 8:47 AM

## 2018-12-12 NOTE — Progress Notes (Addendum)
NEUROLOGY PROGRESS NOTE  Subjective: Patient is laying in bed, does not open his eyes even to noxious stimuli.  When asked to wake up he mumbles "I am awake".  The whole time he is not opening his eyes nor conversing.  With any other questions or attempts to have a conversation he keeps his mouth open and mumbles.  He does not want to be touched, and when attempting to do exam he will follow only some commands then becomes very agitated.  Exam: Vitals:   12/12/18 0650 12/12/18 0908  BP: (!) 166/68 (!) 150/73  Pulse: (!) 118 (!) 114  Resp: 20 20  Temp: 98.2 F (36.8 C) 98.3 F (36.8 C)  SpO2: 96% 100%    Physical Exam  HEENT-  Normocephalic, no lesions, without obvious abnormality.  Normal external eye and conjunctiva.   Extremities- Warm, dry and intact Musculoskeletal-no joint tenderness, deformity or swelling Skin-warm and dry, no hyperpigmentation, vitiligo, or suspicious lesions  Neuro:  Mental Status: Alert enough to follow simple commands but remains with his eyes closed.  Able to lift his arms when asked, show me his thumb when asked and sticks out his tongue when asked but does not move his legs even to noxious stimuli.  Will not answer questions and then becomes extremely agitated. Cranial Nerves: II: Blinks to threat III,IV, VI: Keeps eyelids closed, disconjugate gaze with intact doll's, pupils equal, round, reactive to light  V,VII: Face symmetric, winces to noxious stimuli VIII: hearing intact to questions XII: midline tongue extension Motor: Moves bilateral upper extremities antigravity approximately 4 inches above the bed, will not follow the commands to push and pull to test strength.  Continues to have tremor in left arm when held antigravity.  However at rest there is no tremor.  Able to grip my fingers with weak grip.  As far as his lower legs I could not get him even with noxious stimuli to lift them off the bed Sensory: Responds to noxious stimuli Deep Tendon  Reflexes: Depressed throughout Plantars: No Babinski bilaterally   Medications:  Scheduled: .  stroke: mapping our early stages of recovery book   Does not apply Once  . ALPRAZolam  0.5 mg Oral BID  . amLODipine  10 mg Oral Daily  . aspirin  300 mg Rectal Daily   Or  . aspirin  325 mg Oral Daily  . divalproex  250 mg Oral BID  . dorzolamide  1 drop Both Eyes BID  . enoxaparin (LOVENOX) injection  30 mg Subcutaneous Q24H  . ferrous sulfate  325 mg Oral Q breakfast  . hydrALAZINE  100 mg Oral BID  . insulin aspart  0-9 Units Subcutaneous Q4H  . lacosamide  100 mg Oral BID  . levETIRAcetam  1,000 mg Oral BID  . metoprolol succinate  25 mg Oral Daily  . nicotine  21 mg Transdermal Daily  . potassium chloride  10 mEq Oral Daily  . pravastatin  40 mg Oral q1800   Continuous: . dextrose      Pertinent Labs/Diagnostics: EEG shows diffuse disturbance that is etiologically nonspecific.  No epileptiform activity noted TSH 1.446 ANA, RPR, folate pending Valproic acid level is less than 10 Sodium 158 Glucose 199 BUN 70 Creatinine 1.83    Mr Brain Wo Contrast  Result Date: 12/11/2018 CLINICAL DATA:  73 y/o  M; TIA, initial exam. EXAM: . IMPRESSION: Axial DWI, T2 FLAIR, T2, and SWI sequences were acquired. Motion degraded SWI sequence. 1. No acute stroke, mass  effect, or extra-axial collection identified. 2. Moderate to severe chronic microvascular ischemic changes and volume loss of the brain. Electronically Signed   By: Kristine Garbe M.D.   On: 12/11/2018 22:42   Ct Cerebral Perfusion W Contrast  Result Date: 12/11/2018 CLINICAL DATA:  Left facial droop and left arm weakness.  IMPRESSION: 1. No emergent large vessel occlusion. 2. 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 3. Patent right vertebral artery without significant stenosis. 4. 55% left common carotid artery stenosis. 5. Advanced intracranial atherosclerosis including moderate  bilateral ICA stenoses and multiple severe anterior and posterior circulation branch vessel stenoses. 6. Negative CT perfusion imaging. These results were communicated to Dr. Erlinda Hong at 4:00 pmon 12/11/2018 by text page via the [facility] messaging system. Electronically Signed   By: Logan Bores M.D.   On: 12/11/2018 17:58    Ct Head Code Stroke Wo Contrast  Result Date: 12/11/2018 CLINICAL DATA:  Code stroke. Left facial droop and left arm weakness.  IMPRESSION: 1. No evidence of acute intracranial abnormality. 2. ASPECTS is 10. 3. 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 These results were called by telephone at the time of interpretation on 12/11/2018 at 3:45 p.m. to Dr. Erlinda Hong, who verbally acknowledged these results. Electronically Signed   By: Logan Bores M.D.   On: 12/11/2018 17:27    Etta Quill PA-C [facility] [phone]  EEG 6/30: This is an abnormal EEG secondary to general background slowing.  This finding may be seen with a diffuse disturbance that is etiologically nonspecific, but may include a metabolic encephalopathy, among other possibilities.  Despite the patient having multiple episodes with upper extremity shaking and moaning, no epileptiform activity is noted.      Assessment: 73 year old male presenting with encephalopathy/delirium. Of note, during prior admission at [facility] for partial seizure, he developed a delirium. He was loaded with Keppra for seizure, then had recurrent seizure requiring addition of Vimpat to his regimen.  1. MRI negative for acute abnormality. Moderate to severe chronic microvascular ischemic changes and volume loss of the brain are noted.  2. EEG without electrographic seizure or interictal discharges. Diffuse slowing is compatible with an encephalopathy.  3. Initial sodium of 158, glucose 200, BUN 70, creatinine 1.83, white blood cell count 14.2 with UA showing leukocytes. Given these abnormalities, his presentation is  felt most likely to be secondary to a metabolic encephalopathy. 4. Would be susceptible to a metabolic encephalopathy given his history of dementia.  5. 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  Recommendations: -Continue treating metabolic abnormalities -Continue treating likely UTI --Continue Keppra and Vimpat for seizure control --CIWA protocol given treatment with Xanax as an outpatient, prior to admission --Neurology will sign off. Please call if there are additional questions.   Electronically signed: Dr. Kerney Elbe 12/12/2018, 9:24 AM

## 2018-12-12 NOTE — Progress Notes (Signed)
OT Cancellation Note  Patient Details Name: [name] MRN: [id number] DOB: Aug 31, 1945   Cancelled Treatment:    Reason Eval/Treat Not Completed: Patient declined, no reason specified(Pt resting comfortably and unable to arouse.)  Pt had a lot of scans/exams today. OTR to follow up next available treatment day.   Ebony Hail Harold Hedge) Marsa Aris OTR/L Acute Rehabilitation Services Pager: [phone] Office: [place] 12/12/2018, 2:17 PM

## 2018-12-12 NOTE — Progress Notes (Signed)
SLP Cancellation Note  Patient Details Name: Julian Tyler MRN: [id number] DOB: 03-22-1946   Cancelled treatment:       Reason Eval/Treat Not Completed: Patient at procedure or test/unavailable(Pt having EEG completed at this time. SLP will follow up. )  Masie Bermingham I. Hardin Negus, [place], [place] Office number [phone] Pager [place] 12/12/2018, 10:46 AM

## 2018-12-12 NOTE — Progress Notes (Signed)
Paged on call doctor to switch PO meds to IV if possible.

## 2018-12-12 NOTE — Progress Notes (Signed)
Paged on call to convert vimpat and possible depakote over to IV form; no new orders given.

## 2018-12-13 ENCOUNTER — Encounter (HOSPITAL_COMMUNITY): Payer: Medicare Other

## 2018-12-13 LAB — BASIC METABOLIC PANEL
Anion gap: 9 (ref 5–15)
BUN: 53 mg/dL — ABNORMAL HIGH (ref 8–23)
CO2: 22 mmol/L (ref 22–32)
Calcium: 9.7 mg/dL (ref 8.9–10.3)
Chloride: 127 mmol/L — ABNORMAL HIGH (ref 98–111)
Creatinine, Ser: 1.4 mg/dL — ABNORMAL HIGH (ref 0.61–1.24)
GFR calc Af Amer: 58 mL/min — ABNORMAL LOW (ref >60)
GFR calc non Af Amer: 50 mL/min — ABNORMAL LOW (ref >60)
Glucose, Bld: 229 mg/dL — ABNORMAL HIGH (ref 70–99)
Potassium: 4 mmol/L (ref 3.5–5.1)
Sodium: 158 mmol/L — ABNORMAL HIGH (ref 135–145)

## 2018-12-13 LAB — GLUCOSE, CAPILLARY
Glucose-Capillary: 204 mg/dL — ABNORMAL HIGH (ref 70–99)
Glucose-Capillary: 204 mg/dL — ABNORMAL HIGH (ref 70–99)
Glucose-Capillary: 214 mg/dL — ABNORMAL HIGH (ref 70–99)
Glucose-Capillary: 223 mg/dL — ABNORMAL HIGH (ref 70–99)
Glucose-Capillary: 223 mg/dL — ABNORMAL HIGH (ref 70–99)
Glucose-Capillary: 251 mg/dL — ABNORMAL HIGH (ref 70–99)

## 2018-12-13 LAB — FOLATE RBC
Folate, Hemolysate: 305 ng/mL
Folate, RBC: 1010 ng/mL (ref >498)
Hematocrit: 30.2 % — ABNORMAL LOW (ref 37.5–51.0)

## 2018-12-13 LAB — CBC
HCT: 31.7 % — ABNORMAL LOW (ref 39.0–52.0)
Hemoglobin: 9 g/dL — ABNORMAL LOW (ref 13.0–17.0)
MCH: 23.5 pg — ABNORMAL LOW (ref 26.0–34.0)
MCHC: 28.4 g/dL — ABNORMAL LOW (ref 30.0–36.0)
MCV: 82.8 fL (ref 80.0–100.0)
Platelets: 266 10*3/uL (ref 150–400)
RBC: 3.83 MIL/uL — ABNORMAL LOW (ref 4.22–5.81)
RDW: 15.8 % — ABNORMAL HIGH (ref 11.5–15.5)
WBC: 13 10*3/uL — ABNORMAL HIGH (ref 4.0–10.5)
nRBC: 0.3 % — ABNORMAL HIGH (ref 0.0–0.2)

## 2018-12-13 LAB — URINE CULTURE

## 2018-12-13 LAB — ANA: Anti Nuclear Antibody (ANA): NEGATIVE

## 2018-12-13 MED ORDER — ENOXAPARIN SODIUM 40 MG/0.4ML ~~LOC~~ SOLN
40.0000 mg | SUBCUTANEOUS | Status: AC
  Administered 2018-12-13 – 2019-01-05 (×24): 40 mg via SUBCUTANEOUS
  Filled 2018-12-13 (×24): qty 0.4

## 2018-12-13 NOTE — Progress Notes (Signed)
Inpatient Diabetes Program Recommendations  AACE/ADA: New Consensus Statement on Inpatient Glycemic Control (2015)  Target Ranges:  Prepandial:   less than 140 mg/dL      Peak postprandial:   less than 180 mg/dL (1-2 hours)      Critically ill patients:  140 - 180 mg/dL    Review of Glycemic Control  Results for [name], [name] (MRN [id number]) as of 12/13/2018 13:30  Ref. Range 12/12/2018 09:10 12/12/2018 11:38 12/12/2018 15:51 12/12/2018 19:59 12/13/2018 00:14 12/13/2018 03:45 12/13/2018 07:24 12/13/2018 12:41  Glucose-Capillary Latest Ref Range: 70 - 99 mg/dL 221 (H) 228 (H) 189 (H) 187 (H) 251 (H) 204 (H) 223 (H) 223 (H)   Diabetes history: DM 2 Outpatient Diabetes medications:  Lantus 30 units q HS, Humalog 5 units tid, Metformin 500 mg q breakfast Current orders for Inpatient glycemic control:  Novolog sensitive q 4 hours Inpatient Diabetes Program Recommendations:   Please add Lantus 15 units daily (this is 1/2 of home dose).   Thanks,  Tama Headings RN, MSN, BC-ADM Inpatient Diabetes Coordinator Team Pager [phone] (8a-5p)

## 2018-12-13 NOTE — Progress Notes (Signed)
PT Cancellation Note  Patient Details Name: Julian Tyler MRN: [id number] DOB: 1946/03/28   Cancelled Treatment:    Reason Eval/Treat Not Completed: Fatigue/lethargy limiting ability to participate   Patient remains lethargic and did not arouse with multiple methods attempted. Would mumble with sternal rub. Winced with neck ROM/repositioning. Left hand found to be holding his condom catheter that had been dislodged. (RN and NT informed). Pt also spontaneously reaching for nasal cannula x 3 during session. He was not able to remove it, however was attempting to grasp it. RN made aware.    Jeanie Cooks Hayley Horn, PT 12/13/2018, 3:59 PM

## 2018-12-13 NOTE — Progress Notes (Signed)
PROGRESS NOTE    Julian Tyler  UYE:[id number] DOB: 06-21-1945 DOA: 12/11/2018 PCP: Milana Na., MD    Brief Narrative:  73 year old male with a history of previous stroke, hypertension, diabetes, seizure disorder, dementia who is a resident of a skilled nursing facility, was brought to the hospital with worsening mental status.  Found to be significantly dehydrated, hypernatremic, with acute kidney injury.  Possible urinary tract infection, started on antibiotics.  He was also recently started on Seroquel, Xanax and Depakote for combativeness.  He has a history of seizures and is on Keppra and Vimpat.  Neurology followed him during his hospital stay.   Assessment & Plan:   Principal Problem:   Altered mental status Active Problems:   Diabetes mellitus type 2, uncontrolled (HCC)   Severe protein-calorie malnutrition (HCC)   Anemia   Hypernatremia   ARF (acute renal failure) (HCC)   Tachycardia   Lactic acidosis   AMS (altered mental status)   1. Acute metabolic encephalopathy.  Suspect this is related to dehydration and hypernatremia.  Urinalysis indicates possible infection, so he has been started on antibiotics.  Urine culture in process.  Ammonia is unremarkable.  PCO2 on blood gases also unrevealing.  Imaging of the brain did not show any acute findings and EEG does not show any epileptiform discharges.  Neurology following, now signed off. 2. Hypernatremia.  Related to decreased p.o. intake.  Continue gentle hydration with hypotonic fluids. 3. History of seizures.  Since patient is very lethargic and not able to take medications by mouth.  Continue antiepileptics with Keppra, Vimpat via IV route. 4. Acute kidney injury.  Creatinine was normal when discharged from [facility] last month.  Possibly related to dehydration.  Need to follow urine output as he is hydrated.  Creatinine mildly improving with hydration. 5. Diabetes.  Metformin on hold due to  renal failure.  Continue on sliding scale insulin.  Blood sugar stable. 6. Hypertension.  Oral metoprolol on hold since he is unable to take by mouth.  Started on IV Lopressor. 7. Dementia, supportive care 8. Goals of care.  Patient appears to have had a decline over the past 1 to 2 months since his admission to [facility].  He has had failure to thrive and decreased p.o. intake resulting in severe dehydration/hypernatremia and protein calorie malnutrition.  Currently, his son wishes to patient's CODE STATUS is full code.  He is agreeable to meeting with palliative care to further discuss goals.  The patient has multiple children that will need to be involved in this conversation.   DVT prophylaxis: Lovenox Code Status: Full code Family Communication: Discussed with son [name] Disposition Plan: Return to skilled nursing facility on discharge   Consultants:   Neurology  Procedures:     Antimicrobials:   Ceftriaxone 7/1 >   Subjective: Somnolent, but turns his head to voice.  Speech is mumbled and incoherent  Objective: Vitals:   12/13/18 0746 12/13/18 1258 12/13/18 1632 12/13/18 1634  BP: (!) 179/83 (!) 177/79  (!) 142/58  Pulse: (!) 102 (!) 108  95  Resp: 18 14  20   Temp: 98.8 F (37.1 C) 98.6 F (37 C) 98.2 F (36.8 C) 98.2 F (36.8 C)  TempSrc: Oral Oral  Oral  SpO2: 100% 100%  100%    Intake/Output Summary (Last 24 hours) at 12/13/2018 1848 Last data filed at 12/13/2018 1637 Gross per 24 hour  Intake 775 ml  Output 1200 ml  Net -425 ml  There were no vitals filed for this visit.  Examination:  General exam: Somnolent, speech is incoherent Respiratory system: Clear to auscultation. Respiratory effort normal. Cardiovascular system:RRR. No murmurs, rubs, gallops. Gastrointestinal system: Abdomen is nondistended, soft and nontender. No organomegaly or masses felt. Normal bowel sounds heard. Central nervous system: . No focal neurological deficits.  Extremities: No C/C/E, +pedal pulses Skin: No rashes, lesions or ulcers Psychiatry: Somnolent.      Data Reviewed: I have personally reviewed following labs and imaging studies  CBC: Recent Labs  Lab 12/11/18 1525 12/11/18 1600 12/11/18 1622 12/12/18 1525 12/12/18 1526 12/13/18 0411  WBC  --  14.2*  --  12.7*  --  13.0*  NEUTROABS  --  10.9*  --   --   --   --   HGB 11.6* 9.1* 10.2* 9.4*  --  9.0*  HCT 34.0* 32.3* 30.0* 32.7* 30.2* 31.7*  MCV  --  83.0  --  82.4  --  82.8  PLT  --  264  --  276  --  053   Basic Metabolic Panel: Recent Labs  Lab 12/11/18 1525 12/11/18 1600 12/11/18 1622 12/12/18 0500 12/13/18 0411  NA 158* 155* 157* 157* 158*  K 4.9 4.5 5.1 4.2 4.0  CL 129* 125* 127* 123* 127*  CO2  --  19*  --  18* 22  GLUCOSE 200* 199* 198* 227* 229*  BUN 67* 70* 79* 64* 53*  CREATININE 1.50* 1.83* 1.60* 1.63* 1.40*  CALCIUM  --  9.5  --  9.6 9.7   GFR: Estimated Creatinine Clearance: 59.8 mL/min (A) (by C-G formula based on SCr of 1.4 mg/dL (H)). Liver Function Tests: Recent Labs  Lab 12/11/18 1600 12/12/18 0500  AST 34 31  ALT 20 22  ALKPHOS 53 62  BILITOT 1.0 0.9  PROT 8.0 8.3*  ALBUMIN 2.4* 2.4*   No results for input(s): LIPASE, AMYLASE in the last 168 hours. Recent Labs  Lab 12/12/18 1525  AMMONIA 43*   Coagulation Profile: Recent Labs  Lab 12/11/18 1600  INR 1.4*   Cardiac Enzymes: No results for input(s): CKTOTAL, CKMB, CKMBINDEX, TROPONINI in the last 168 hours. BNP (last 3 results) No results for input(s): PROBNP in the last 8760 hours. HbA1C: Recent Labs    12/12/18 1526  HGBA1C 6.1*   CBG: Recent Labs  Lab 12/13/18 0014 12/13/18 0345 12/13/18 0724 12/13/18 1241 12/13/18 1811  GLUCAP 251* 204* 223* 223* 214*   Lipid Profile: Recent Labs    12/12/18 0500  CHOL 144  HDL 22*  LDLCALC 95  TRIG 133  CHOLHDL 6.5   Thyroid Function Tests: Recent Labs    12/12/18 0500  TSH 1.446   Anemia Panel: Recent Labs     12/12/18 0500  VITAMINB12 330  FERRITIN 4,327*  TIBC 192*  IRON 21*   Sepsis Labs: Recent Labs  Lab 12/11/18 1700 12/11/18 1802  LATICACIDVEN 2.2* 2.3*    Recent Results (from the past 240 hour(s))  SARS Coronavirus 2 (CEPHEID - Performed in [place] hospital lab), Hosp Order     Status: None   Collection Time: 12/11/18  5:00 PM   Specimen: Nasopharyngeal Swab  Result Value Ref Range Status   SARS Coronavirus 2 NEGATIVE NEGATIVE Final    Comment: (NOTE) If result is NEGATIVE SARS-CoV-2 target nucleic acids are NOT DETECTED. The SARS-CoV-2 RNA is generally detectable in upper and lower  respiratory specimens during the acute phase of infection. The lowest  concentration of SARS-CoV-2 viral  copies this assay can detect is 250  copies / mL. A negative result does not preclude SARS-CoV-2 infection  and should not be used as the sole basis for treatment or other  patient management decisions.  A negative result may occur with  improper specimen collection / handling, submission of specimen other  than nasopharyngeal swab, presence of viral mutation(s) within the  areas targeted by this assay, and inadequate number of viral copies  (<250 copies / mL). A negative result must be combined with clinical  observations, patient history, and epidemiological information. If result is POSITIVE SARS-CoV-2 target nucleic acids are DETECTED. The SARS-CoV-2 RNA is generally detectable in upper and lower  respiratory specimens dur ing the acute phase of infection.  Positive  results are indicative of active infection with SARS-CoV-2.  Clinical  correlation with patient history and other diagnostic information is  necessary to determine patient infection status.  Positive results do  not rule out bacterial infection or co-infection with other viruses. If result is PRESUMPTIVE POSTIVE SARS-CoV-2 nucleic acids MAY BE PRESENT.   A presumptive positive result was obtained on the submitted  specimen  and confirmed on repeat testing.  While 2019 novel coronavirus  (SARS-CoV-2) nucleic acids may be present in the submitted sample  additional confirmatory testing may be necessary for epidemiological  and / or clinical management purposes  to differentiate between  SARS-CoV-2 and other Sarbecovirus currently known to infect humans.  If clinically indicated additional testing with an alternate test  methodology [phone]) is advised. The SARS-CoV-2 RNA is generally  detectable in upper and lower respiratory sp ecimens during the acute  phase of infection. The expected result is Negative. Fact Sheet for Patients:  StrictlyIdeas.no Fact Sheet for Healthcare Providers: BankingDealers.co.za This test is not yet approved or cleared by the [place] FDA and has been authorized for detection and/or diagnosis of SARS-CoV-2 by FDA under an Emergency Use Authorization (EUA).  This EUA will remain in effect (meaning this test can be used) for the duration of the COVID-19 declaration under Section 564(b)(1) of the Act, 21 U.S.C. section 360bbb-3(b)(1), unless the authorization is terminated or revoked sooner. Performed at [facility], [place] [address] [place], [place] [address]   Culture, blood (routine x 2)     Status: None (Preliminary result)   Collection Time: 12/11/18  5:00 PM   Specimen: BLOOD  Result Value Ref Range Status   Specimen Description BLOOD RIGHT ANTECUBITAL  Final   Special Requests   Final    BOTTLES DRAWN AEROBIC AND ANAEROBIC Blood Culture adequate volume   Culture   Final    NO GROWTH 2 DAYS Performed at [facility], [place] [address], [place], [place] [address]    Report Status PENDING  Incomplete  Culture, blood (routine x 2)     Status: None (Preliminary result)   Collection Time: 12/11/18  5:58 PM   Specimen: BLOOD RIGHT HAND  Result Value Ref Range Status   Specimen Description BLOOD RIGHT  HAND  Final   Special Requests   Final    BOTTLES DRAWN AEROBIC AND ANAEROBIC Blood Culture results may not be optimal due to an inadequate volume of blood received in culture bottles   Culture   Final    NO GROWTH 2 DAYS Performed at [facility], [place] [address], [place],  [address]    Report Status PENDING  Incomplete  MRSA PCR Screening     Status: None  Collection Time: 12/12/18 12:28 AM   Specimen: Nasal Mucosa; Nasopharyngeal  Result Value Ref Range Status   MRSA by PCR NEGATIVE NEGATIVE Final    Comment:        The GeneXpert MRSA Assay (FDA approved for NASAL specimens only), is one component of a comprehensive MRSA colonization surveillance program. It is not intended to diagnose MRSA infection nor to guide or monitor treatment for MRSA infections. Performed at [facility], [place] [address], [place], [place] [address]   Urine culture     Status: Abnormal   Collection Time: 12/12/18  5:23 AM   Specimen: Urine, Random  Result Value Ref Range Status   Specimen Description URINE, RANDOM  Final   Special Requests   Final    NONE Performed at [facility], [place] [address] [place], [place] [address]    Culture MULTIPLE SPECIES PRESENT, SUGGEST RECOLLECTION (A)  Final   Report Status 12/13/2018 FINAL  Final         Radiology Studies: Mr Brain 35 Contrast  Result Date: 12/11/2018 CLINICAL DATA:  73 y/o  M; TIA, initial exam. EXAM: MRI HEAD WITHOUT CONTRAST TECHNIQUE: Axial DWI, axial T2 FLAIR, axial T2 propeller, and axial SWI sequences were acquired. Patient was unable to continue and additional sequences were not acquired. COMPARISON:  None. FINDINGS: Brain: No reduced diffusion to suggest acute or early subacute infarction. Nonspecific confluent foci of T2 FLAIR hyperintense signal abnormality are present in subcortical and periventricular white matter compatible with moderate to severe chronic microvascular ischemic changes. There is moderate  to severe volume loss the brain. No focal mass effect, extra-axial collection, hydrocephalus, or herniation identified. Motion degraded SWI sequence. Vascular: Normal flow voids. Skull and upper cervical spine: Normal marrow signal. Sinuses/Orbits: Negative. Other: None. IMPRESSION: Axial DWI, T2 FLAIR, T2, and SWI sequences were acquired. Motion degraded SWI sequence. 1. No acute stroke, mass effect, or extra-axial collection identified. 2. Moderate to severe chronic microvascular ischemic changes and volume loss of the brain. Electronically Signed   By: Kristine Garbe M.D.   On: 12/11/2018 22:42   Nm Pulmonary Perfusion  Result Date: 12/12/2018 CLINICAL DATA:  Elevated D-dimer, mental status changes, question pulmonary embolism, history hypertension, diabetes mellitus EXAM: NUCLEAR MEDICINE PERFUSION LUNG SCAN TECHNIQUE: Perfusion images were obtained in multiple projections after intravenous injection of radiopharmaceutical. Ventilation scans intentionally deferred if perfusion scan and chest x-ray adequate for interpretation during COVID 19 epidemic. RADIOPHARMACEUTICALS:  1.5 mCi Tc-33m MAA IV COMPARISON:  None Correlation: Chest radiograph 12/11/2018 FINDINGS: Minimal diminished perfusion in LEFT lower lobe. No other perfusion abnormalities identified. Chest radiograph demonstrates LEFT basilar atelectasis, greater than suggested by perfusion scan. Findings represent a very low probability for pulmonary embolism. Ventilation exam not required. IMPRESSION: Very low probability for pulmonary embolism. Electronically Signed   By: Lavonia Dana M.D.   On: 12/12/2018 13:07   Dg Chest Port 1 View  Result Date: 12/12/2018 CLINICAL DATA:  Respiratory distress EXAM: PORTABLE CHEST 1 VIEW COMPARISON:  December 11, 2018 FINDINGS: Again identified are low lung volumes. There is probable atelectasis at the lung bases bilaterally. There are a few densities in the right mid lung zone favored to represent  atelectasis. There may be small bilateral pleural effusions. There is no pneumothorax. The cardiac silhouette is stable from prior study. There is no acute osseous abnormality. IMPRESSION: Persistent low lung volumes with bibasilar atelectasis. No new focal airspace opacity that would be concerning for aspiration. Electronically Signed   By:  Constance Holster M.D.   On: 12/12/2018 13:57   Dg Chest Port 1 View  Result Date: 12/11/2018 CLINICAL DATA:  73 year old male with weakness. EXAM: PORTABLE CHEST 1 VIEW COMPARISON:  CTA head and neck, CT perfusion today. FINDINGS: Portable AP semi upright view at 1853 hours. Somewhat low lung volumes. Normal cardiac size and mediastinal contours. Visualized tracheal air column is within normal limits. Crowding of lung markings at the bases. Otherwise Allowing for portable technique the lungs are clear. No pneumothorax. Paucity of bowel gas. No acute osseous abnormality identified. IMPRESSION: Low lung volumes with mild atelectasis. Electronically Signed   By: Genevie Ann M.D.   On: 12/11/2018 19:21        Scheduled Meds: . ALPRAZolam  0.5 mg Oral BID  . amLODipine  10 mg Oral Daily  . aspirin  300 mg Rectal Daily   Or  . aspirin  325 mg Oral Daily  . chlorhexidine  15 mL Mouth Rinse BID  . dorzolamide  1 drop Both Eyes BID  . enoxaparin (LOVENOX) injection  40 mg Subcutaneous Q24H  . ferrous sulfate  325 mg Oral Q breakfast  . hydrALAZINE  100 mg Oral BID  . insulin aspart  0-9 Units Subcutaneous Q4H  . mouth rinse  15 mL Mouth Rinse q12n4p  . metoprolol tartrate  5 mg Intravenous Q6H  . nicotine  21 mg Transdermal Daily  . potassium chloride  10 mEq Oral Daily  . pravastatin  40 mg Oral q1800   Continuous Infusions: . cefTRIAXone (ROCEPHIN)  IV 1 g (12/12/18 2122)  . dextrose 75 mL/hr at 12/13/18 1747  . lacosamide (VIMPAT) IV 100 mg (12/13/18 1028)  . levETIRAcetam 1,000 mg (12/13/18 1304)  . valproate sodium 250 mg (12/13/18 1126)     LOS: 1  day    Time spent: 56mins    Kathie Dike, MD [facility]   If 7PM-7AM, please contact night-coverage [email or web address]  12/13/2018, 6:48 PM

## 2018-12-13 NOTE — Progress Notes (Signed)
OT Cancellation Note  Patient Details Name: Julian Tyler MRN: [id number] DOB: 27-Jul-1945   Cancelled Treatment:    Reason Eval/Treat Not Completed: Medical issues which prohibited therapy(Pt unable to arouse for more than 1-2 seconds. RN aware.)  Pt shallow breathing and required suction- RN came in and pt looked more comfortable afterwards. OT to continue to follow for OT eval.  [name]) Marsa Aris OTR/L Acute Rehabilitation Services Pager: [phone] Office: [place] 12/13/2018, 11:52 AM

## 2018-12-13 NOTE — Progress Notes (Signed)
SLP Cancellation Note  Patient Details Name: Julian Tyler MRN: [id number] DOB: June 06, 1946   Cancelled treatment:       Reason Eval/Treat Not Completed: Fatigue/lethargy limiting ability to participate(Pt's level of alertness remains too reduced for evaluation. SLP will follow up.)  Devaris Quirk I. Hardin Negus, [place], [place] Office number [phone] Pager [place] 12/13/2018, 4:32 PM

## 2018-12-14 DIAGNOSIS — F0151 Vascular dementia with behavioral disturbance: Secondary | ICD-10-CM

## 2018-12-14 DIAGNOSIS — Z515 Encounter for palliative care: Secondary | ICD-10-CM

## 2018-12-14 LAB — GLUCOSE, CAPILLARY
Glucose-Capillary: 214 mg/dL — ABNORMAL HIGH (ref 70–99)
Glucose-Capillary: 215 mg/dL — ABNORMAL HIGH (ref 70–99)
Glucose-Capillary: 221 mg/dL — ABNORMAL HIGH (ref 70–99)
Glucose-Capillary: 227 mg/dL — ABNORMAL HIGH (ref 70–99)
Glucose-Capillary: 246 mg/dL — ABNORMAL HIGH (ref 70–99)
Glucose-Capillary: 261 mg/dL — ABNORMAL HIGH (ref 70–99)
Glucose-Capillary: 278 mg/dL — ABNORMAL HIGH (ref 70–99)

## 2018-12-14 LAB — COMPREHENSIVE METABOLIC PANEL
ALT: 18 U/L (ref 0–44)
AST: 21 U/L (ref 15–41)
Albumin: 2.3 g/dL — ABNORMAL LOW (ref 3.5–5.0)
Alkaline Phosphatase: 59 U/L (ref 38–126)
Anion gap: 10 (ref 5–15)
BUN: 41 mg/dL — ABNORMAL HIGH (ref 8–23)
CO2: 20 mmol/L — ABNORMAL LOW (ref 22–32)
Calcium: 9.5 mg/dL (ref 8.9–10.3)
Chloride: 126 mmol/L — ABNORMAL HIGH (ref 98–111)
Creatinine, Ser: 1.29 mg/dL — ABNORMAL HIGH (ref 0.61–1.24)
GFR calc Af Amer: >60 mL/min (ref >60)
GFR calc non Af Amer: 55 mL/min — ABNORMAL LOW (ref >60)
Glucose, Bld: 233 mg/dL — ABNORMAL HIGH (ref 70–99)
Potassium: 4.2 mmol/L (ref 3.5–5.1)
Sodium: 156 mmol/L — ABNORMAL HIGH (ref 135–145)
Total Bilirubin: 0.5 mg/dL (ref 0.3–1.2)
Total Protein: 7.9 g/dL (ref 6.5–8.1)

## 2018-12-14 LAB — CBC
HCT: 32.6 % — ABNORMAL LOW (ref 39.0–52.0)
Hemoglobin: 9.2 g/dL — ABNORMAL LOW (ref 13.0–17.0)
MCH: 23.6 pg — ABNORMAL LOW (ref 26.0–34.0)
MCHC: 28.2 g/dL — ABNORMAL LOW (ref 30.0–36.0)
MCV: 83.6 fL (ref 80.0–100.0)
Platelets: 264 10*3/uL (ref 150–400)
RBC: 3.9 MIL/uL — ABNORMAL LOW (ref 4.22–5.81)
RDW: 15.9 % — ABNORMAL HIGH (ref 11.5–15.5)
WBC: 10.9 10*3/uL — ABNORMAL HIGH (ref 4.0–10.5)
nRBC: 0.7 % — ABNORMAL HIGH (ref 0.0–0.2)

## 2018-12-14 LAB — BLOOD GAS, ARTERIAL
Acid-base deficit: 1.2 mmol/L (ref 0.0–2.0)
Bicarbonate: 23.5 mmol/L (ref 20.0–28.0)
Drawn by: 560031
O2 Content: 2 L/min
O2 Saturation: 98.6 %
Patient temperature: 98.6
pCO2 arterial: 42.7 mmHg (ref 32.0–48.0)
pH, Arterial: 7.36 (ref 7.350–7.450)
pO2, Arterial: 120 mmHg — ABNORMAL HIGH (ref 83.0–108.0)

## 2018-12-14 LAB — LACTIC ACID, PLASMA: Lactic Acid, Venous: 1.5 mmol/L (ref 0.5–1.9)

## 2018-12-14 LAB — AMMONIA: Ammonia: 37 umol/L — ABNORMAL HIGH (ref 9–35)

## 2018-12-14 NOTE — Care Management Important Message (Signed)
Important Message  Patient Details  Name: Julian Tyler MRN: [id number] Date of Birth: 12-21-1945   Medicare Important Message Given:  Yes     Orbie Pyo 12/14/2018, 2:20 PM

## 2018-12-14 NOTE — Evaluation (Signed)
Occupational Therapy Evaluation Patient Details Name: Julian Tyler MRN: [id number] DOB: Jul 01, 1945 Today's Date: 12/14/2018    History of Present Illness Patient came from a nursing home with altered mental status. Patient is unable to answer questions so prior level of function is questionable. PMH: stroke, CKD, Dry eyes, DM, hyperlipidemia,    Clinical Impression   Pt PTA: from SNF. Pt difficult to arouse and with decreased alertness. Pt currently requires increased assist totalA to maxA for ADL and totalA for bed mobility. Once EOB, pt able to sit upright with intermittent assist. Pt totalA for ADL, pt unable to remove washcloth from face. Pt would benefit from continued OT skilled services for ADL, mobility and safety in SNF setting. OT following acutely.    Follow Up Recommendations  SNF    Equipment Recommendations  None recommended by OT    Recommendations for Other Services       Precautions / Restrictions Precautions Precautions: None Restrictions Weight Bearing Restrictions: No      Mobility Bed Mobility Overal bed mobility: Needs Assistance Bed Mobility: Supine to Sit;Sit to Supine     Supine to sit: Total assist;+2 for physical assistance Sit to supine: Total assist;+2 for physical assistance;+2 for safety/equipment   General bed mobility comments: total assist to sit up. Once sitting intemittent assist from min to tototal assist to remain sitting. Patient unable to stand   Transfers                 General transfer comment: DNT- not appropriate    Balance Overall balance assessment: Needs assistance Sitting-balance support: No upper extremity supported;Feet supported Sitting balance-Leahy Scale: Poor Sitting balance - Comments: posterior lean                                   ADL either performed or assessed with clinical judgement   ADL Overall ADL's : Needs assistance/impaired Eating/Feeding: Total assistance   Grooming:  Total assistance   Upper Body Bathing: Total assistance   Lower Body Bathing: Total assistance   Upper Body Dressing : Total assistance   Lower Body Dressing: Total assistance   Toilet Transfer: Total assistance   Toileting- Clothing Manipulation and Hygiene: Total assistance;+2 for physical assistance;+2 for safety/equipment       Functional mobility during ADLs: Maximal assistance;Total assistance;+2 for physical assistance;+2 for safety/equipment(hard to assess due to decreased alertness) General ADL Comments: Pt requiring maxA to [place] for ADL tasks      Vision Patient Visual Report: Other (comment)(unable to arouse long enough for eyes to stay open) Vision Assessment?: Vision impaired- to be further tested in functional context Additional Comments: unable to arouse fully     Perception     Praxis      Pertinent Vitals/Pain Pain Assessment: Faces Faces Pain Scale: No hurt     Hand Dominance Right   Extremity/Trunk Assessment Upper Extremity Assessment Upper Extremity Assessment: Generalized weakness   Lower Extremity Assessment Lower Extremity Assessment: Defer to PT evaluation;Generalized weakness   Cervical / Trunk Assessment Cervical / Trunk Assessment: Normal   Communication Communication Communication: Other (comment)(unable to answer questions at this time)   Cognition Arousal/Alertness: Lethargic Behavior During Therapy: Flat affect Overall Cognitive Status: Difficult to assess Area of Impairment: Attention;Orientation;Memory;Following commands;Awareness;Safety/judgement;Problem solving                 Orientation Level: Disoriented to;Person;Place;Time Current Attention Level: Divided Memory: Decreased recall of precautions;Decreased  short-term memory Following Commands: Follows one step commands inconsistently Safety/Judgement: Decreased awareness of safety;Decreased awareness of deficits   Problem Solving: Requires verbal cues;Requires  tactile cues General Comments: Patient is not responsive    General Comments  Unable to fully arouse pt.    Exercises     Shoulder Instructions      Home Living Family/patient expects to be discharged to:: Skilled nursing facility                                        Prior Functioning/Environment Level of Independence: Needs assistance        Comments: unknown prior level of function. Patient with wound guards on B/L heels        OT Problem List: Decreased strength;Decreased range of motion;Decreased activity tolerance;Impaired balance (sitting and/or standing);Decreased coordination;Impaired vision/perception;Decreased cognition;Decreased safety awareness;Pain      OT Treatment/Interventions: Self-care/ADL training;Therapeutic exercise;Neuromuscular education;Energy conservation;DME and/or AE instruction;Therapeutic activities;Visual/perceptual remediation/compensation;Cognitive remediation/compensation;Patient/family education;Balance training    OT Goals(Current goals can be found in the care plan section) Acute Rehab OT Goals Patient Stated Goal: unable to state goals  OT Goal Formulation: With patient Time For Goal Achievement: 12/29/18 Potential to Achieve Goals: Good ADL Goals Pt Will Perform Grooming: with modified independence;bed level Pt Will Transfer to Toilet: with mod assist;with +2 assist;squat pivot transfer;bedside commode Additional ADL Goal #1: Pt will attend to task in 3/3 trials with mutlimodal cues to continue to attend to task, Additional ADL Goal #2: Pt modA for ADL at bed level or sitting EOB.  OT Frequency: Min 1X/week   Barriers to D/C:            Co-evaluation              AM-PAC OT "6 Clicks" Daily Activity     Outcome Measure Help from another person eating meals?: Total Help from another person taking care of personal grooming?: Total Help from another person toileting, which includes using toliet, bedpan, or  urinal?: Total Help from another person bathing (including washing, rinsing, drying)?: Total Help from another person to put on and taking off regular upper body clothing?: Total Help from another person to put on and taking off regular lower body clothing?: Total 6 Click Score: 6   End of Session Equipment Utilized During Treatment: Gait belt Nurse Communication: Other (comment)(decreased alertness)  Activity Tolerance: Patient limited by fatigue;Patient limited by lethargy;Treatment limited secondary to medical complications (Comment) Patient left: in bed;with call bell/phone within reach;with bed alarm set  OT Visit Diagnosis: Unsteadiness on feet (R26.81);Muscle weakness (generalized) (M62.81);Other symptoms and signs involving cognitive function;Adult, failure to thrive (R62.7)                Time: 6754-4920 OT Time Calculation (min): 15 min Charges:  OT General Charges $OT Visit: 1 Visit OT Evaluation $OT Eval Low Complexity: 1 Low  [name]) Marsa Aris OTR/L Acute Rehabilitation Services Pager: [phone] Office: [phone]   Audie Pinto 12/14/2018, 3:47 PM

## 2018-12-14 NOTE — Evaluation (Signed)
Speech Language Pathology Evaluation Patient Details Name: Julian Tyler MRN: [id number] DOB: 12/14/1945 Today's Date: 12/14/2018 Time: 1155-1209 SLP Time Calculation (min) (ACUTE ONLY): 14 min  Problem List:  Patient Active Problem List   Diagnosis Date Noted  . Palliative care by specialist   . AMS (altered mental status) 12/12/2018  . Severe protein-calorie malnutrition ([place]) 12/11/2018  . Anemia 12/11/2018  . Hypernatremia 12/11/2018  . ARF (acute renal failure) ([place]) 12/11/2018  . Tachycardia 12/11/2018  . Altered mental status 12/11/2018  . Lactic acidosis 12/11/2018  . Encounter for family conference without patient present 12/01/2018  . Dysarthria 11/25/2018  . Expressive aphasia 11/25/2018  . Partial seizures ([place]) 11/25/2018  . Delirium 11/25/2018  . Dementia with behavioral problem ([place]) 11/25/2018  . Diabetes mellitus type 2, uncontrolled ([place]) 11/25/2018  . Hypertension associated with diabetes ([place]) 11/25/2018  . Acute kidney injury ([place]) 11/25/2018  . Hyperlipidemia associated with type 2 diabetes mellitus ([place]) 11/25/2018  . Iron deficiency anemia 11/25/2018  . Tobacco abuse 11/25/2018   Past Medical History:  Past Medical History:  Diagnosis Date  . Cataract   . Chorioretinal scar of left eye   . Diabetes ([place])   . Dry eyes   . Glaucoma   . HLD (hyperlipidemia)   . HTN (hypertension)   . Stroke [facility])    Past Surgical History: No past surgical history on file. HPI:  Pt is a 73 y.o. male, w hypertension, hyperlipidemia, dm2, h/o stroke , seizure, dementia, who presented with altered mental status. Per nurse, pt had right side deficit, not able to walk by self, ? Slight slurred speech. Pt recently had seroquel , xanax, and depakote added due to combativeness. MRI of the brain was negative. Acute metabolic encephalopathy suspect to be secondary to to dehydration and hypernatremia. Urinalysis indicated possible infection; he was started on antibiotics.     Assessment / Plan / Recommendation Clinical Impression  Pt was unable to provide any history during the evaluation. However, per palliative care's note, he was admitted from [facility] facility following hospitalization at [facility] for new onset of seizures. Prior to being admitted to [facility] [place] for nursing facility, patient was living with his wife, was independent with ADLs, and was ambulatory.   Pt's level of alertness was improved compared to yesterday but remains suboptimal for evaluation. He demonstrated deficits in receptive and expressive language related to his ability to follow commands, answer complex yes/no questions, and formulate phrases and sentences. He demosntrated severe dysarthria characterized by reduced articulatory precision, a reduced ability to adequately coordinate respiration with speech, and a reduced vocal itensity. Speech intelligibility was reduced even at the word level and repetitions were often required. SLP will continue to follow pt for treatment and for further assessment once his level of alertness improves.     SLP Assessment  SLP Recommendation/Assessment: Patient needs continued Speech Lanaguage Pathology Services SLP Visit Diagnosis: Dysarthria and anarthria (R47.1)    Follow Up Recommendations  Skilled Nursing facility;24 hour supervision/assistance    Frequency and Duration min 2x/week  2 weeks      SLP Evaluation Cognition  Overall Cognitive Status: Difficult to assess(Due to receptive language impairments. )        Comprehension  Auditory Comprehension Overall Auditory Comprehension: Impaired Yes/No Questions: Impaired Basic Biographical Questions: (4/5) Complex Questions: (2/4 with perseveration on "no") Commands: Impaired One Step Basic Commands: (3/5) Two Step Basic Commands: (0/4)    Expression Verbal Expression Overall Verbal Expression:  Impaired Initiation: Impaired Automatic Speech:  Counting(7/10 with consistent cues to continue) Level of Generative/Spontaneous Verbalization: Word Naming: Impairment Responsive: Not tested Confrontation: (0/4) Convergent: Not tested Divergent: Not tested Written Expression Dominant Hand: Right   Oral / Motor  Oral Motor/Sensory Function Overall Oral Motor/Sensory Function: Other (comment)(Pt unable to follow the necessary commands for completion. ) Motor Speech Overall Motor Speech: Impaired Respiration: Impaired Level of Impairment: Word Phonation: Low vocal intensity;Hoarse Resonance: Within functional limits Articulation: Impaired Level of Impairment: Word Intelligibility: Intelligibility reduced Word: 25-49% accurate Phrase: 0-24% accurate Sentence: 0-24% accurate Motor Planning: Witnin functional limits Motor Speech Errors: Consistent   Juwann Sherk I. Hardin Negus, [place], [place] Office number [phone] Pager [place] 12/14/2018, 12:29 PM

## 2018-12-14 NOTE — Progress Notes (Signed)
PROGRESS NOTE    Julian Tyler  VVO:[id number] DOB: Nov 13, 1945 DOA: 12/11/2018 PCP: Julian Na., MD    Brief Narrative:  73 year old male with a history of previous stroke, hypertension, diabetes, seizure disorder, dementia who is a resident of a skilled nursing facility, was brought to the hospital with worsening mental status.  Found to be significantly dehydrated, hypernatremic, with acute kidney injury.  Possible urinary tract infection, started on antibiotics.  He was also recently started on Seroquel, Xanax and Depakote for combativeness.  He has a history of seizures and is on Keppra and Vimpat.  Neurology followed him during his hospital stay.   Assessment & Plan:   Principal Problem:   Altered mental status Active Problems:   Diabetes mellitus type 2, uncontrolled (HCC)   Severe protein-calorie malnutrition (HCC)   Anemia   Hypernatremia   ARF (acute renal failure) (HCC)   Tachycardia   Lactic acidosis   AMS (altered mental status)   Palliative care by specialist   1. Acute metabolic encephalopathy, superimposed on baseline dementia.  Suspect this is related to dehydration and hypernatremia.  Urinalysis indicates possible infection, so he has been started on antibiotics.  Urine culture showed non specific growth.  Ammonia is unremarkable.  PCO2 on blood gases also unrevealing.  MRI Imaging of the brain did not show any acute findings and EEG does not show any epileptiform discharges.  Neurology following, now signed off. He has not been receiving any sedative medications. 2. Hypernatremia.  Related to decreased p.o. intake.  Continue gentle hydration with hypotonic fluids. 3. History of seizures.  Since patient is very lethargic and not able to take medications by mouth.  Continue antiepileptics with Keppra, Vimpat via IV route. 4. Acute kidney injury.  Creatinine was normal when discharged from [facility] hospital last month.  Decline in renal function  related to dehydration.  Need to follow urine output as he is hydrated.  Creatinine mildly improving with hydration. 5. Diabetes.  Metformin on hold due to renal failure.  Continue on sliding scale insulin.  Blood sugar stable. 6. Hypertension.  Oral metoprolol on hold since he is unable to take by mouth.  Continue on IV Lopressor. 7. Dementia, likely vascular, supportive care 8. Severe protein calorie malnurtition/Failure to thrive.  9. Abnormal Echo. Echo reports possible mobile density on aortic valve. Blood cultures have shown no growth and he has been afebrile. Can consider TEE for further characterization if his condition starts to improve 10. Goals of care.  Patient appears to have had a decline over the past 1 to 2 months since his admission to [facility] regional.  He has had failure to thrive and decreased p.o. intake resulting in severe dehydration/hypernatremia and protein calorie malnutrition.  Currently, his son wishes to patient's CODE STATUS is full code. Palliative care is assisting in goals of care discussion. There are 10 children that want to be involved in the decision making process. Tentative meeting scheduled for tomorrow.   DVT prophylaxis: Lovenox Code Status: Full code Family Communication: Discussed with son Julian Tyler 7/3 Disposition Plan: Return to skilled nursing facility on discharge   Consultants:   Neurology  Palliative care  Procedures:   EEG: This is an abnormal EEG secondary to general background slowing.  This finding may be seen with a diffuse disturbance that is etiologically nonspecific, but may include a metabolic encephalopathy, among other possibilities.  Despite the patient having multiple episodes with upper extremity shaking and moaning, no epileptiform activity is noted.  Echo: 1. The left ventricle has normal systolic function with an ejection fraction of 60-65%. The cavity size was normal. There is mildly increased left ventricular wall  thickness. Left ventricular diastolic Doppler parameters are consistent with impaired  relaxation. No evidence of left ventricular regional wall motion abnormalities.  2. The right ventricle has normal systolic function. The cavity was normal. There is no increase in right ventricular wall thickness.  3. Trivial pericardial effusion is present.  4. Mild calcification of the mitral valve leaflet. There is mild mitral annular calcification present. No evidence of mitral valve stenosis. No significant mitral regurgitation.  5. The aortic valve is tricuspid. Moderate calcification of the aortic valve. No stenosis of the aortic valve. In the parasternal long axis view, there appears to be a small mobile mass on the atrial size of the aortic valve, possible vegetation. Poor  images on this study, cannot see aortic valve well in other views. Would suggest TEE to evaluate more closely.  6. The aortic root is normal in size and structure.  7. IVC was normal in size. PA systolic pressure 35 mmHg.   8. Technically difficult study with poor acoustic windows.  Antimicrobials:   Ceftriaxone 7/1 >   Subjective: Unresponsive. Does not respond to voice or tactile stimuli  Objective: Vitals:   12/14/18 0739 12/14/18 1226 12/14/18 1228 12/14/18 1551  BP: 134/61 (!) 161/67 (!) 167/66 (!) 143/61  Pulse: 81 (!) 103 (!) 101 87  Resp: 16 16 16 16   Temp: 98.2 F (36.8 C) 98.3 F (36.8 C)  98.2 F (36.8 C)  TempSrc: Oral Oral  Oral  SpO2: 100% 100% 100% 100%    Intake/Output Summary (Last 24 hours) at 12/14/2018 1859 Last data filed at 12/14/2018 1100 Gross per 24 hour  Intake -  Output 350 ml  Net -350 ml   There were no vitals filed for this visit.  Examination:  General exam: unresponsive Respiratory system: Clear to auscultation. Respiratory effort normal. Cardiovascular system:RRR. No murmurs, rubs, gallops. Gastrointestinal system: Abdomen is nondistended, soft and nontender. No organomegaly  or masses felt. Normal bowel sounds heard. Central nervous system: right sided facial droop (chronic), unresponsive Extremities: No C/C/E, +pedal pulses Skin: No rashes, lesions or ulcers Psychiatry: unresponsive   Data Reviewed: I have personally reviewed following labs and imaging studies  CBC: Recent Labs  Lab 12/11/18 1600 12/11/18 1622 12/12/18 1525 12/12/18 1526 12/13/18 0411 12/14/18 0416  WBC 14.2*  --  12.7*  --  13.0* 10.9*  NEUTROABS 10.9*  --   --   --   --   --   HGB 9.1* 10.2* 9.4*  --  9.0* 9.2*  HCT 32.3* 30.0* 32.7* 30.2* 31.7* 32.6*  MCV 83.0  --  82.4  --  82.8 83.6  PLT 264  --  276  --  266 283   Basic Metabolic Panel: Recent Labs  Lab 12/11/18 1600 12/11/18 1622 12/12/18 0500 12/13/18 0411 12/14/18 0416  NA 155* 157* 157* 158* 156*  K 4.5 5.1 4.2 4.0 4.2  CL 125* 127* 123* 127* 126*  CO2 19*  --  18* 22 20*  GLUCOSE 199* 198* 227* 229* 233*  BUN 70* 79* 64* 53* 41*  CREATININE 1.83* 1.60* 1.63* 1.40* 1.29*  CALCIUM 9.5  --  9.6 9.7 9.5   GFR: Estimated Creatinine Clearance: 64.9 mL/min (A) (by C-G formula based on SCr of 1.29 mg/dL (H)). Liver Function Tests: Recent Labs  Lab 12/11/18 1600 12/12/18 0500 12/14/18 0416  AST 34 31 21  ALT 20 22 18   ALKPHOS 53 62 59  BILITOT 1.0 0.9 0.5  PROT 8.0 8.3* 7.9  ALBUMIN 2.4* 2.4* 2.3*   No results for input(s): LIPASE, AMYLASE in the last 168 hours. Recent Labs  Lab 12/12/18 1525 12/14/18 1420  AMMONIA 43* 37*   Coagulation Profile: Recent Labs  Lab 12/11/18 1600  INR 1.4*   Cardiac Enzymes: No results for input(s): CKTOTAL, CKMB, CKMBINDEX, TROPONINI in the last 168 hours. BNP (last 3 results) No results for input(s): PROBNP in the last 8760 hours. HbA1C: Recent Labs    12/12/18 1526  HGBA1C 6.1*   CBG: Recent Labs  Lab 12/14/18 0153 12/14/18 0412 12/14/18 0844 12/14/18 1316 12/14/18 1645  GLUCAP 214* 221* 227* 278* 261*   Lipid Profile: Recent Labs    12/12/18  0500  CHOL 144  HDL 22*  LDLCALC 95  TRIG 133  CHOLHDL 6.5   Thyroid Function Tests: Recent Labs    12/12/18 0500  TSH 1.446   Anemia Panel: Recent Labs    12/12/18 0500  VITAMINB12 330  FERRITIN 4,327*  TIBC 192*  IRON 21*   Sepsis Labs: Recent Labs  Lab 12/11/18 1700 12/11/18 1802 12/14/18 1420  LATICACIDVEN 2.2* 2.3* 1.5    Recent Results (from the past 240 hour(s))  SARS Coronavirus 2 (CEPHEID - Performed in [place] hospital lab), Hosp Order     Status: None   Collection Time: 12/11/18  5:00 PM   Specimen: Nasopharyngeal Swab  Result Value Ref Range Status   SARS Coronavirus 2 NEGATIVE NEGATIVE Final    Comment: (NOTE) If result is NEGATIVE SARS-CoV-2 target nucleic acids are NOT DETECTED. The SARS-CoV-2 RNA is generally detectable in upper and lower  respiratory specimens during the acute phase of infection. The lowest  concentration of SARS-CoV-2 viral copies this assay can detect is 250  copies / mL. A negative result does not preclude SARS-CoV-2 infection  and should not be used as the sole basis for treatment or other  patient management decisions.  A negative result may occur with  improper specimen collection / handling, submission of specimen other  than nasopharyngeal swab, presence of viral mutation(s) within the  areas targeted by this assay, and inadequate number of viral copies  (<250 copies / mL). A negative result must be combined with clinical  observations, patient history, and epidemiological information. If result is POSITIVE SARS-CoV-2 target nucleic acids are DETECTED. The SARS-CoV-2 RNA is generally detectable in upper and lower  respiratory specimens dur ing the acute phase of infection.  Positive  results are indicative of active infection with SARS-CoV-2.  Clinical  correlation with patient history and other diagnostic information is  necessary to determine patient infection status.  Positive results do  not rule out  bacterial infection or co-infection with other viruses. If result is PRESUMPTIVE POSTIVE SARS-CoV-2 nucleic acids MAY BE PRESENT.   A presumptive positive result was obtained on the submitted specimen  and confirmed on repeat testing.  While 2019 novel coronavirus  (SARS-CoV-2) nucleic acids may be present in the submitted sample  additional confirmatory testing may be necessary for epidemiological  and / or clinical management purposes  to differentiate between  SARS-CoV-2 and other Sarbecovirus currently known to infect humans.  If clinically indicated additional testing with an alternate test  methodology [phone]) is advised. The SARS-CoV-2 RNA is generally  detectable in upper and lower respiratory sp ecimens during the acute  phase of infection. The  expected result is Negative. Fact Sheet for Patients:  StrictlyIdeas.no Fact Sheet for Healthcare Providers: BankingDealers.co.za This test is not yet approved or cleared by the [place] FDA and has been authorized for detection and/or diagnosis of SARS-CoV-2 by FDA under an Emergency Use Authorization (EUA).  This EUA will remain in effect (meaning this test can be used) for the duration of the COVID-19 declaration under Section 564(b)(1) of the Act, 21 U.S.C. section 360bbb-3(b)(1), unless the authorization is terminated or revoked sooner. Performed at [facility], [place] [address] [place], [place] [address]   Culture, blood (routine x 2)     Status: None (Preliminary result)   Collection Time: 12/11/18  5:00 PM   Specimen: BLOOD  Result Value Ref Range Status   Specimen Description BLOOD RIGHT ANTECUBITAL  Final   Special Requests   Final    BOTTLES DRAWN AEROBIC AND ANAEROBIC Blood Culture adequate volume   Culture   Final    NO GROWTH 3 DAYS Performed at [facility], [place] [address] [place], [place] [address]    Report Status PENDING  Incomplete  Culture,  blood (routine x 2)     Status: None (Preliminary result)   Collection Time: 12/11/18  5:58 PM   Specimen: BLOOD RIGHT HAND  Result Value Ref Range Status   Specimen Description BLOOD RIGHT HAND  Final   Special Requests   Final    BOTTLES DRAWN AEROBIC AND ANAEROBIC Blood Culture results may not be optimal due to an inadequate volume of blood received in culture bottles   Culture   Final    NO GROWTH 3 DAYS Performed at [facility], [place] [address] [place], [place] [address]    Report Status PENDING  Incomplete  MRSA PCR Screening     Status: None   Collection Time: 12/12/18 12:28 AM   Specimen: Nasal Mucosa; Nasopharyngeal  Result Value Ref Range Status   MRSA by PCR NEGATIVE NEGATIVE Final    Comment:        The GeneXpert MRSA Assay (FDA approved for NASAL specimens only), is one component of a comprehensive MRSA colonization surveillance program. It is not intended to diagnose MRSA infection nor to guide or monitor treatment for MRSA infections. Performed at [facility], [place] [address] [place], [place] [address]   Urine culture     Status: Abnormal   Collection Time: 12/12/18  5:23 AM   Specimen: Urine, Random  Result Value Ref Range Status   Specimen Description URINE, RANDOM  Final   Special Requests   Final    NONE Performed at [facility], [place] [address] [place], [place] [address]    Culture MULTIPLE SPECIES PRESENT, SUGGEST RECOLLECTION (A)  Final   Report Status 12/13/2018 FINAL  Final         Radiology Studies: No results found.      Scheduled Meds: . aspirin  300 mg Rectal Daily   Or  . aspirin  325 mg Oral Daily  . chlorhexidine  15 mL Mouth Rinse BID  . dorzolamide  1 drop Both Eyes BID  . enoxaparin (LOVENOX) injection  40 mg Subcutaneous Q24H  . insulin aspart  0-9 Units Subcutaneous Q4H  . mouth rinse  15 mL Mouth Rinse q12n4p  . metoprolol tartrate  5 mg Intravenous Q6H  . nicotine  21 mg Transdermal Daily  .  potassium chloride  10 mEq Oral Daily   Continuous Infusions: .  cefTRIAXone (ROCEPHIN)  IV 1 g (12/13/18 2231)  . dextrose 125 mL/hr at 12/14/18 1747  . lacosamide (VIMPAT) IV 100 mg (12/14/18 1035)  . levETIRAcetam 1,000 mg (12/14/18 1420)     LOS: 2 days    Time spent: 46mins    Kathie Dike, MD [facility]   If 7PM-7AM, please contact night-coverage [email or web address]  12/14/2018, 6:59 PM

## 2018-12-14 NOTE — Care Management Important Message (Signed)
Important Message  Patient Details  Name: Julian Tyler MRN: [id number] Date of Birth: 10-Oct-1945   Medicare Important Message Given:  Yes Due to illness patient could not sign.  Signed copy left at patient bedside     Orbie Pyo 12/14/2018, 2:29 PM

## 2018-12-14 NOTE — Progress Notes (Signed)
Physical Therapy Evaluation Patient Details Name: Julian Tyler MRN: [id number] DOB: 1945/12/21 Today's Date: 12/14/2018   History of Present Illness  Patient came from a nursing home with alteterd mental status. Patient is unable to answer questions so prior level of function is questionable. PMH: stroke, CKD Dry eyes, diabetes, hyperlipidemia,   Clinical Impression  Patient required total assist for all mobility. He was able to sit at the edge of the bed with min ->total assist at different times. The patient was unable to answer questions or follow simple commands. He was more alert when he was sitting up at the edge of the bed. Acute therapy will continue to follow. Patient would benefit from rehab at a SNF.    Follow Up Recommendations SNF    Equipment Recommendations  None recommended by PT    Recommendations for Other Services Rehab consult     Precautions / Restrictions Precautions Precautions: None Restrictions Weight Bearing Restrictions: No      Mobility  Bed Mobility Overal bed mobility: Needs Assistance Bed Mobility: Supine to Sit;Sit to Supine     Supine to sit: Total assist;+2 for physical assistance Sit to supine: Total assist;+2 for physical assistance;+2 for safety/equipment   General bed mobility comments: total assist to sit up. Once sitting intemittent assist from min to tototal assist to remain sitting. Patient unable to stand   Transfers                    Ambulation/Gait                Stairs            Wheelchair Mobility    Modified Rankin (Stroke Patients Only)       Balance Overall balance assessment: Needs assistance Sitting-balance support: No upper extremity supported;Feet supported Sitting balance-Leahy Scale: Poor Sitting balance - Comments: needs assistance to remain sitting.                                      Pertinent Vitals/Pain Pain Assessment: Faces Faces Pain Scale: No hurt     Home Living Family/patient expects to be discharged to:: Skilled nursing facility                      Prior Function Level of Independence: Needs assistance         Comments: unknown prior level of function. Patient does hace wounds on his feet.      Hand Dominance   Dominant Hand: Right    Extremity/Trunk Assessment   Upper Extremity Assessment Upper Extremity Assessment: Defer to OT evaluation    Lower Extremity Assessment Lower Extremity Assessment: (unable to assess 2nd to lethargy )    Cervical / Trunk Assessment Cervical / Trunk Assessment: Normal  Communication   Communication: Other (comment)(unable to answer questions at this time )  Cognition Arousal/Alertness: Lethargic Behavior During Therapy: Flat affect Overall Cognitive Status: Impaired/Different from baseline Area of Impairment: Attention;Orientation;Memory;Following commands;Awareness;Safety/judgement;Problem solving                 Orientation Level: Disoriented to;Person;Place;Time Current Attention Level: Divided Memory: Decreased recall of precautions;Decreased short-term memory Following Commands: Follows one step commands inconsistently Safety/Judgement: Decreased awareness of safety;Decreased awareness of deficits   Problem Solving: Requires verbal cues;Requires tactile cues General Comments: Patient is not responsive       General Comments  Exercises     Assessment/Plan    PT Assessment Patient needs continued PT services  PT Problem List Decreased strength;Decreased range of motion;Decreased activity tolerance;Decreased mobility;Decreased balance;Decreased knowledge of use of DME;Decreased safety awareness;Decreased knowledge of precautions       PT Treatment Interventions DME instruction;Gait training;Functional mobility training;Therapeutic exercise;Therapeutic activities;Balance training;Neuromuscular re-education;Patient/family education    PT Goals  (Current goals can be found in the Care Plan section)  Acute Rehab PT Goals Patient Stated Goal: unable to state goals  PT Goal Formulation: With patient Time For Goal Achievement: 12/21/18 Potential to Achieve Goals: Fair    Frequency Min 2X/week   Barriers to discharge        Co-evaluation               AM-PAC PT "6 Clicks" Mobility  Outcome Measure Help needed turning from your back to your side while in a flat bed without using bedrails?: Total Help needed moving from lying on your back to sitting on the side of a flat bed without using bedrails?: Total Help needed moving to and from a bed to a chair (including a wheelchair)?: Total Help needed standing up from a chair using your arms (e.g., wheelchair or bedside chair)?: Total Help needed to walk in hospital room?: Total Help needed climbing 3-5 steps with a railing? : Total 6 Click Score: 6    End of Session Equipment Utilized During Treatment: Gait belt Activity Tolerance: Patient limited by lethargy Patient left: in bed;with call bell/phone within reach;with bed alarm set Nurse Communication: Mobility status PT Visit Diagnosis: Other abnormalities of gait and mobility (R26.89);Muscle weakness (generalized) (M62.81)    Time: 2863-8177 PT Time Calculation (min) (ACUTE ONLY): 20 min   Charges:   PT Evaluation $PT Eval Moderate Complexity: 1 Mod           Carney Living PT DPT  12/14/2018, 10:32 AM

## 2018-12-14 NOTE — Consult Note (Signed)
Consultation Note Date: 12/14/2018   Patient Name: Julian Tyler  DOB: 05-Oct-1945  MRN: [id number]  Age / Sex: 73 y.o., male  PCP: Puschinsky, Fransico Him., MD Referring Physician: Kathie Dike, MD  Reason for Consultation: Establishing goals of care and Psychosocial/spiritual support  HPI/Patient Profile: 73 y.o. male  with past medical history of dementia, seizure disorder, hypertension, hyperlipidemia, diabetes type 2, history of CVA admitted on 12/11/2018 with failure to thrive picture, and altered mental status, hyper natremia.  Code stroke was called on patient as he had significant facial droop.  MRI of the brain performed on 12/11/2018 showed no acute processes, no CVA; moderate to severe atrophy as well as microvascular changes.  Other imaging reveals advanced intracranial arterial occlusions without eminent large vessel occlusions been present.  Consult ordered for goals of care.   Clinical Assessment and Goals of Care: Patient seen, chart reviewed.  Patient is nonverbal, minimally responsive.  He did open his eyes when the nurse called his name.  He did not respond to me by name or with sternal rub.  He exhibits no startle response.  The nurse had to suction out all medications that were given to him yesterday as he was still holding those in his mouth  Patient has 10 children and is currently married.  He was admitted from [facility] facility where he had been discharged to after being hospitalized at [facility] hospital secondary to new onset of seizures.  Prior to being admitted to [facility] for nursing facility, patient was living with his wife, Julian Tyler.  He was independent in ADLs, able to feed himself, bathe himself and dress and was ambulatory.  This conversation was based on 1 of his 10 children, Julian Tyler at [phone]  [name] does  not have a designated Press photographer.  Mr. Julian Tyler is point of contact for the family but they are making decisions together as a family.  [place] also verified that Mr. Julian Tyler is their point of contact whenever they have needed assistance with Mr. Panas  Family is struggling with how rapidly patient has declined , in terms of understanding clinical decline.  Mr. Julian Tyler shares with me he does not feel like he or his family can make further decisions until they understand more fully what is going on with their father.  I did begin discussions regarding dementia, and how the disease trajectory related to dementia can often seem very dramatic, with sharp clinical declines.  I did update him medically in terms of recent laboratory values and imaging and other diagnostics performed  Patient is unable to speak for himself.  His healthcare proxy's would be the majority of his 10 children, as well as spouse.  Mr. Julian Tyler at [phone] has been point of contact for both the hospital as well as [facility]   Confirmed that patient is full scope of treatment, full code for now Family is requesting  conference call with MD as well as all 10 children and patient's spouse.  Palliative medicine can help facilitate this in terms of further goals of care Code Status/Advance Care Planning:  Full code    Palliative Prophylaxis:   Aspiration, Bowel Regimen, Delirium Protocol, Eye Care, Frequent Pain Assessment, Oral Care and Turn Reposition  Additional Recommendations (Limitations, Scope, Preferences):  Full Scope Treatment  Psycho-social/Spiritual:   Desire for further Chaplaincy support:no  Additional Recommendations: Referral to Community Resources   Prognosis:   Unable to determine  Discharge Planning: To Be Determined      Primary Diagnoses: Present on Admission: . Severe protein-calorie malnutrition ([place]) .  Anemia . Hypernatremia . ARF (acute renal failure) ([place]) . Tachycardia . Diabetes mellitus type 2, uncontrolled ([place]) . Altered mental status . Lactic acidosis . AMS (altered mental status)   I have reviewed the medical record, interviewed the patient and family, and examined the patient. The following aspects are pertinent.  Past Medical History:  Diagnosis Date  . Cataract   . Chorioretinal scar of left eye   . Diabetes ([place])   . Dry eyes   . Glaucoma   . HLD (hyperlipidemia)   . HTN (hypertension)   . Stroke [facility])    Social History   Socioeconomic History  . Marital status: Married    Spouse name: Not on file  . Number of children: Not on file  . Years of education: Not on file  . Highest education level: Not on file  Occupational History  . Not on file  Social Needs  . Financial resource strain: Not on file  . Food insecurity    Worry: Not on file    Inability: Not on file  . Transportation needs    Medical: Not on file    Non-medical: Not on file  Tobacco Use  . Smoking status: Never Smoker  . Smokeless tobacco: Current User    Types: Chew  Substance and Sexual Activity  . Alcohol use: Yes    Comment: "some beer"  . Drug use: Not on file  . Sexual activity: Not on file  Lifestyle  . Physical activity    Days per week: Not on file    Minutes per session: Not on file  . Stress: Not on file  Relationships  . Social Herbalist on phone: Not on file    Gets together: Not on file    Attends religious service: Not on file    Active member of club or organization: Not on file    Attends meetings of clubs or organizations: Not on file    Relationship status: Not on file  Other Topics Concern  . Not on file  Social History Narrative   No significant surgical or family history.  Current user of smokeless tobacco - chew.  Nonsmoker.   No family history on file. Scheduled Meds: . aspirin  300 mg Rectal Daily   Or  . aspirin  325 mg Oral Daily   . chlorhexidine  15 mL Mouth Rinse BID  . dorzolamide  1 drop Both Eyes BID  . enoxaparin (LOVENOX) injection  40 mg Subcutaneous Q24H  . insulin aspart  0-9 Units Subcutaneous Q4H  . mouth rinse  15 mL Mouth Rinse q12n4p  . metoprolol tartrate  5 mg Intravenous Q6H  . nicotine  21 mg Transdermal Daily  . potassium chloride  10 mEq Oral Daily   Continuous Infusions: . cefTRIAXone (ROCEPHIN)  IV 1 g (  12/13/18 2231)  . dextrose 75 mL/hr at 12/14/18 0803  . lacosamide (VIMPAT) IV 100 mg (12/14/18 1035)  . levETIRAcetam 1,000 mg (12/14/18 0154)  . valproate sodium 250 mg (12/13/18 2328)   PRN Meds:.acetaminophen **OR** acetaminophen (TYLENOL) oral liquid 160 mg/5 mL **OR** acetaminophen, Melatonin Medications Prior to Admission:  Prior to Admission medications   Medication Sig Start Date End Date Taking? Authorizing Provider  ALPRAZolam Duanne Moron) 0.5 MG tablet Take 1 tablet (0.5 mg total) by mouth 2 (two) times daily. 11/26/18  Yes Hennie Duos, MD  amLODipine (NORVASC) 10 MG tablet Take 10 mg by mouth daily.   Yes [provider]  aspirin EC 81 MG tablet Take 81 mg by mouth daily.   Yes [provider]  cloNIDine (CATAPRES - DOSED IN MG/24 HR) 0.3 mg/24hr patch Place 1 patch onto the skin once a week last placed on 11/18/2018   Yes [provider]  divalproex (DEPAKOTE) 250 MG DR tablet Take 250 mg by mouth 2 (two) times daily.    Yes [provider]  dorzolamide (TRUSOPT) 2 % ophthalmic solution Place 1 drop into both eyes 2 (two) times daily.   Yes [provider]  ferrous sulfate 325 (65 FE) MG tablet Take 325 mg by mouth daily with breakfast.   Yes [provider]  hydrALAZINE (APRESOLINE) 100 MG tablet Take 100 mg by mouth 2 (two) times daily.   Yes [provider]  Insulin Glargine (LANTUS SOLOSTAR) 100 UNIT/ML Solostar Pen Inject 30 Units into the skin at bedtime.    Yes [provider]  insulin lispro (HUMALOG)  100 UNIT/ML cartridge Inject 5 units into skin three times a day with meals   Yes [provider]  Lacosamide (VIMPAT) 100 MG TABS Take 100 mg by mouth 2 (two) times a day.    Yes [provider]  lisinopril (ZESTRIL) 20 MG tablet Take 20 mg by mouth daily.   Yes [provider]  metFORMIN (GLUCOPHAGE-XR) 500 MG 24 hr tablet Take 500 mg by mouth daily with breakfast.   Yes [provider]  metoprolol succinate (TOPROL-XL) 25 MG 24 hr tablet Take 25 mg by mouth daily. For HTN   Yes [provider]  nicotine ([employer] NICOTINE) 21 mg/24hr patch Place 21 mg onto the skin daily. Place 1 patch onto skin daily Remove old patch prior to placement of new patch   Yes [provider]  potassium chloride (K-DUR) 10 MEQ tablet Take 10 mEq by mouth daily.   Yes [provider]  pravastatin (PRAVACHOL) 40 MG tablet Take 40 mg by mouth daily.   Yes [provider]  QUEtiapine (SEROQUEL) 50 MG tablet Take 50 mg by mouth 2 (two) times daily.    Yes [provider]  levETIRAcetam (KEPPRA) 1000 MG tablet Take 1,000 mg by mouth 2 (two) times daily.    [provider]  Melatonin 3 MG TABS Take 3 mg by mouth at bedtime as needed (sleep).     [provider]  Nutritional Supplement LIQD Take 120 mLs by mouth 2 (two) times a day.    [provider]   Allergies  Allergen Reactions  . Hctz [Hydrochlorothiazide] Swelling  . Sulfa Drugs Cross Reactors Swelling  . Zithromax [Azithromycin] Diarrhea  . Robaxin [Methocarbamol] Other (See Comments)    Per MAR   Review of Systems  Unable to perform ROS: Mental status change    Physical Exam Vitals signs and nursing note reviewed.  Constitutional:      Comments:  minimally responsive older male No nonverbal signs and symptoms of pain or distress  HENT:     Head: Normocephalic and atraumatic.  Cardiovascular:     Rate and Rhythm: Normal rate.  Pulmonary:     Effort:  Pulmonary effort is normal.     Comments: No tachypnea or stridor noted Abdominal:     General: Abdomen is flat.  Musculoskeletal:     Right lower leg: Edema present.     Left lower leg: Edema present.  Skin:    General: Skin is warm and dry.  Neurological:     Comments: Unable to test.  Patient is minimally responsive.  Right, decreased nasolabial fold  Psychiatric:     Comments: No overt agitation otherwise unable to perform mental status exam     Vital Signs: BP 134/61 (BP Location: Right Arm)   Pulse 81   Temp 98.2 F (36.8 C) (Oral)   Resp 16   SpO2 100%  Pain Scale: 0-10   Pain Score: Asleep   SpO2: SpO2: 100 % O2 Device:SpO2: 100 % O2 Flow Rate: .O2 Flow Rate (L/min): 2 L/min  IO: Intake/output summary:   Intake/Output Summary (Last 24 hours) at 12/14/2018 1127 Last data filed at 12/13/2018 1637 Gross per 24 hour  Intake -  Output 600 ml  Net -600 ml    LBM: Last BM Date: (pta) Baseline Weight:   Most recent weight:       Palliative Assessment/Data:   Flowsheet Rows     Most Recent Value  Intake Tab  Referral Department  Hospitalist  Unit at Time of Referral  Med/Surg Unit  Palliative Care Primary Diagnosis  Other (Comment)  Date Notified  12/13/18  Palliative Care Type  New Palliative care  Reason for referral  Psychosocial or Spiritual support, Clarify Goals of Care  Date of Admission  12/11/18  Date first seen by Palliative Care  12/14/18  # of days Palliative referral response time  1 Day(s)  # of days IP prior to Palliative referral  2  Clinical Assessment  Palliative Performance Scale Score  30%  Pain Max last 24 hours  Not able to report  Pain Min Last 24 hours  Not able to report  Dyspnea Max Last 24 Hours  Not able to report  Nausea Max Last 24 Hours  Not able to report  Nausea Min Last 24 Hours  Not able to report  Anxiety Max Last 24 Hours  Not able to report  Anxiety Min Last 24 Hours  Not able to report  Other Max Last 24 Hours  Not  able to report  Psychosocial & Spiritual Assessment  Palliative Care Outcomes  Patient/Family meeting held?  Yes  Who was at the meeting?  1 of 10 children, [place]  Provided psychosocial or spiritual support  Palliative Care follow-up planned  Yes, Facility      Time In: 1000 Time Out: 1110 Time Total: 70 min Greater than 50%  of this time was spent counseling and coordinating care related to the above assessment and plan.  Signed by: Dory Horn, NP   Please contact Palliative Medicine Team phone at [phone] for questions and concerns.  For individual provider: See Shea Evans

## 2018-12-15 DIAGNOSIS — G9341 Metabolic encephalopathy: Secondary | ICD-10-CM

## 2018-12-15 DIAGNOSIS — F0150 Vascular dementia without behavioral disturbance: Secondary | ICD-10-CM

## 2018-12-15 LAB — BASIC METABOLIC PANEL
Anion gap: 7 (ref 5–15)
BUN: 26 mg/dL — ABNORMAL HIGH (ref 8–23)
CO2: 23 mmol/L (ref 22–32)
Calcium: 9.1 mg/dL (ref 8.9–10.3)
Chloride: 120 mmol/L — ABNORMAL HIGH (ref 98–111)
Creatinine, Ser: 1.09 mg/dL (ref 0.61–1.24)
GFR calc Af Amer: >60 mL/min (ref >60)
GFR calc non Af Amer: >60 mL/min (ref >60)
Glucose, Bld: 263 mg/dL — ABNORMAL HIGH (ref 70–99)
Potassium: 4 mmol/L (ref 3.5–5.1)
Sodium: 150 mmol/L — ABNORMAL HIGH (ref 135–145)

## 2018-12-15 LAB — GLUCOSE, CAPILLARY
Glucose-Capillary: 190 mg/dL — ABNORMAL HIGH (ref 70–99)
Glucose-Capillary: 206 mg/dL — ABNORMAL HIGH (ref 70–99)
Glucose-Capillary: 206 mg/dL — ABNORMAL HIGH (ref 70–99)
Glucose-Capillary: 228 mg/dL — ABNORMAL HIGH (ref 70–99)
Glucose-Capillary: 253 mg/dL — ABNORMAL HIGH (ref 70–99)
Glucose-Capillary: 261 mg/dL — ABNORMAL HIGH (ref 70–99)

## 2018-12-15 LAB — CBC
HCT: 29.5 % — ABNORMAL LOW (ref 39.0–52.0)
Hemoglobin: 8.4 g/dL — ABNORMAL LOW (ref 13.0–17.0)
MCH: 23.8 pg — ABNORMAL LOW (ref 26.0–34.0)
MCHC: 28.5 g/dL — ABNORMAL LOW (ref 30.0–36.0)
MCV: 83.6 fL (ref 80.0–100.0)
Platelets: 274 10*3/uL (ref 150–400)
RBC: 3.53 MIL/uL — ABNORMAL LOW (ref 4.22–5.81)
RDW: 15.8 % — ABNORMAL HIGH (ref 11.5–15.5)
WBC: 9.7 10*3/uL (ref 4.0–10.5)
nRBC: 0.5 % — ABNORMAL HIGH (ref 0.0–0.2)

## 2018-12-15 NOTE — Progress Notes (Signed)
Pt having twitching in left arm and shoulder. VSS. Dr. Ulice Bold notified and came to bedside. Orders to continue to watch. Nurse will continue to monitor. [place]

## 2018-12-15 NOTE — Progress Notes (Signed)
   Palliative medicine progress note  Patient seen, chart reviewed.  Family meeting scheduled for today with 3 of patient's 10 children, and spouse.  Patient is slightly more alert than on 12/14/2018.  When I call his name, he does open his eyes and moan.  His speech is unintelligible.  He is not able to follow commands.  Family in for family conference at 2 PM.  He did become more alert in response to his children and wife's voice.  He was heard to tell family at the bedside,  that he loved them  Later held conference call with other siblings ( not all 58 were on conference call) as well as patient's attending, Dr. Roderic Palau.  Multiple issues were addressed regarding goals of care specifically: CODE STATUS, full code versus DNR discussed and explained; artificial feeding, intubation.  Per attending, hopeful that patient will continue to improve but we should know over the next 2 to 3 days  Patient Profile: 73 y.o. male  with past medical history of dementia, seizure disorder, hypertension, hyperlipidemia, diabetes type 2, history of CVA admitted on 12/11/2018 with failure to thrive picture, and altered mental status, hyper natremia.  Code stroke was called on patient as he had significant facial droop.  MRI of the brain performed on 12/11/2018 showed no acute processes, no CVA; moderate to severe atrophy as well as microvascular changes.  Other imaging reveals advanced intracranial arterial occlusions without eminent large vessel occlusions been present.  Consult ordered for goals of care.  Family meeting held to discuss goals of care on 12/15/2018 with associated conference call in order to include all 10 children  Conference call held but not all 10 children were available on conference call.  Patient is becoming more alert but still has a long way to go  Plan Continue hopeful,watchful waiting over the next 2 to 3 days with continued hydration Hopefully patient will be able to participate with SLP  before we address potentially core track versus long-term feeding via PEG.  Did share with family that artificial feeding was not  proven to  extend life in the setting of underlying dementia Attending would like for palliative medicine to help facilitate further goals of care in 2 to 3 days as we see if patient begins to improve  Prognosis Undetermined.  Patient's encephalopathy is improving slightly but he is not able to take anything by mouth yet.  At this point, patient would qualify for his hospice benefit, with likely a prognosis of 6 months or less if things were to continue to go the way they are now.  If family elects a more comfort, symptom based approach, and he is not able to eat, he would qualify for residential hospice  Disposition To be determined  Thank you, Romona Curls, NP Total time: 70 minutes Time in: 1400 Time out: 1510  Greater than 50% of visit was spent in counseling and coordination of care.  This visit includes extended billing time

## 2018-12-15 NOTE — Progress Notes (Signed)
PROGRESS NOTE    Julian Tyler  ZMO:[id number] DOB: 11/18/1945 DOA: 12/11/2018 PCP: Milana Na., MD    Brief Narrative:  73 year old male with a history of previous stroke, hypertension, diabetes, seizure disorder, dementia who is a resident of a skilled nursing facility, was brought to the hospital with worsening mental status.  Found to be significantly dehydrated, hypernatremic, with acute kidney injury.  Possible urinary tract infection, started on antibiotics.  He was also recently started on Seroquel, Xanax and Depakote for combativeness.  He has a history of seizures and is on Keppra and Vimpat.  Neurology followed him during his hospital stay.   Assessment & Plan:   Principal Problem:   Altered mental status Active Problems:   Diabetes mellitus type 2, uncontrolled (HCC)   Goals of care, counseling/discussion   Severe protein-calorie malnutrition (HCC)   Anemia   Hypernatremia   ARF (acute renal failure) (HCC)   Tachycardia   Lactic acidosis   AMS (altered mental status)   Palliative care by specialist   Metabolic encephalopathy   Vascular dementia without behavioral disturbance ([place])   1. Acute metabolic encephalopathy, superimposed on baseline dementia.  Suspect this is related to dehydration and hypernatremia.  Urinalysis indicates possible infection, so he has been started on antibiotics.  Urine culture showed non specific growth.  Ammonia is unremarkable.  PCO2 on blood gases also unrevealing.  MRI Imaging of the brain did not show any acute findings and EEG does not show any epileptiform discharges.  Neurology following, now signed off. He has not been receiving any sedative medications. Appears to be slowly improving, although not near baseline. 2. Hypernatremia.  Related to decreased p.o. intake.  Continue gentle hydration with hypotonic fluids. 3. History of seizures.  Since patient is very lethargic and not able to take medications by mouth.  Continue  antiepileptics with Keppra, Vimpat via IV route. Per staff, he had some non specific jerking in 1 arm today. This was transient and did not recur on my evaluation. Continue to monitor. 4. Acute kidney injury.  Creatinine was normal when discharged from [facility] last month.  Decline in renal function related to dehydration.  Need to follow urine output as he is hydrated.  Creatinine improving with hydration. 5. Diabetes.  Metformin on hold due to renal failure.  Continue on sliding scale insulin.  Blood sugar stable. 6. Hypertension.  Oral metoprolol on hold since he is unable to take by mouth.  Continue on IV Lopressor. 7. Dementia, likely vascular, supportive care 8. Severe protein calorie malnurtition/Failure to thrive. Once it has been determined that patient can swallow, will request nutrition input. 9. Abnormal Echo. Echo reports possible mobile density on aortic valve. Blood cultures have shown no growth and he has been afebrile. Can consider TEE for further characterization if his condition starts to improve 10. Goals of care.  Patient appears to have had a decline over the past 1 to 2 months since his admission to [facility].  He has had failure to thrive and decreased p.o. intake resulting in severe dehydration/hypernatremia and protein calorie malnutrition.  Currently, his son wishes for patient's CODE STATUS to be full code. Palliative care is assisting in goals of care discussion. Meeting was had to today with several of patient's children, his wife and other family members over conference phone line. We discussed code status, advance directives including feeding tubes and what his wishes would be around end of life. They are not ready to make any  decisions today and need to talk it over amongst themselves.   DVT prophylaxis: Lovenox Code Status: Full code Family Communication: Discussed with son Julian Tyler 7/4 Disposition Plan: Return to skilled nursing  facility on discharge   Consultants:   Neurology  Palliative care  Procedures:   EEG: This is an abnormal EEG secondary to general background slowing.  This finding may be seen with a diffuse disturbance that is etiologically nonspecific, but may include a metabolic encephalopathy, among other possibilities.  Despite the patient having multiple episodes with upper extremity shaking and moaning, no epileptiform activity is noted.   Echo: 1. The left ventricle has normal systolic function with an ejection fraction of 60-65%. The cavity size was normal. There is mildly increased left ventricular wall thickness. Left ventricular diastolic Doppler parameters are consistent with impaired  relaxation. No evidence of left ventricular regional wall motion abnormalities.  2. The right ventricle has normal systolic function. The cavity was normal. There is no increase in right ventricular wall thickness.  3. Trivial pericardial effusion is present.  4. Mild calcification of the mitral valve leaflet. There is mild mitral annular calcification present. No evidence of mitral valve stenosis. No significant mitral regurgitation.  5. The aortic valve is tricuspid. Moderate calcification of the aortic valve. No stenosis of the aortic valve. In the parasternal long axis view, there appears to be a small mobile mass on the atrial size of the aortic valve, possible vegetation. Poor  images on this study, cannot see aortic valve well in other views. Would suggest TEE to evaluate more closely.  6. The aortic root is normal in size and structure.  7. IVC was normal in size. PA systolic pressure 35 mmHg.   8. Technically difficult study with poor acoustic windows.  Antimicrobials:   Ceftriaxone 7/1 >7/4   Subjective: Opens eyes to voice  Objective: Vitals:   12/15/18 0701 12/15/18 1100 12/15/18 1220 12/15/18 1700  BP: (!) 139/54 136/72 (!) 158/61 (!) 155/55  Pulse: 72  94 82  Resp: (!) 29 20  20   Temp:  98.5 F (36.9 C) 98.6 F (37 C)  98 F (36.7 C)  TempSrc: Oral Axillary  Axillary  SpO2: 100% 100% 99% 100%    Intake/Output Summary (Last 24 hours) at 12/15/2018 1917 Last data filed at 12/15/2018 1700 Gross per 24 hour  Intake -  Output 959 ml  Net -959 ml   There were no vitals filed for this visit.  Examination:  General exam: Alert, awake, oriented x 3 Respiratory system: bilateral rhonchi. Respiratory effort normal. Cardiovascular system:RRR. No murmurs, rubs, gallops. Gastrointestinal system: Abdomen is nondistended, soft and nontender. No organomegaly or masses felt. Normal bowel sounds heard. Central nervous system: right sided facial droop (chronic) Extremities: No C/C/E, +pedal pulses Skin: No rashes, lesions or ulcers Psychiatry: somnolent.     Data Reviewed: I have personally reviewed following labs and imaging studies  CBC: Recent Labs  Lab 12/11/18 1600 12/11/18 1622 12/12/18 1525 12/12/18 1526 12/13/18 0411 12/14/18 0416 12/15/18 0807  WBC 14.2*  --  12.7*  --  13.0* 10.9* 9.7  NEUTROABS 10.9*  --   --   --   --   --   --   HGB 9.1* 10.2* 9.4*  --  9.0* 9.2* 8.4*  HCT 32.3* 30.0* 32.7* 30.2* 31.7* 32.6* 29.5*  MCV 83.0  --  82.4  --  82.8 83.6 83.6  PLT 264  --  276  --  266 264 274  Basic Metabolic Panel: Recent Labs  Lab 12/11/18 1600 12/11/18 1622 12/12/18 0500 12/13/18 0411 12/14/18 0416 12/15/18 0807  NA 155* 157* 157* 158* 156* 150*  K 4.5 5.1 4.2 4.0 4.2 4.0  CL 125* 127* 123* 127* 126* 120*  CO2 19*  --  18* 22 20* 23  GLUCOSE 199* 198* 227* 229* 233* 263*  BUN 70* 79* 64* 53* 41* 26*  CREATININE 1.83* 1.60* 1.63* 1.40* 1.29* 1.09  CALCIUM 9.5  --  9.6 9.7 9.5 9.1   GFR: Estimated Creatinine Clearance: 76.9 mL/min (by C-G formula based on SCr of 1.09 mg/dL). Liver Function Tests: Recent Labs  Lab 12/11/18 1600 12/12/18 0500 12/14/18 0416  AST 34 31 21  ALT 20 22 18   ALKPHOS 53 62 59  BILITOT 1.0 0.9 0.5  PROT 8.0 8.3*  7.9  ALBUMIN 2.4* 2.4* 2.3*   No results for input(s): LIPASE, AMYLASE in the last 168 hours. Recent Labs  Lab 12/12/18 1525 12/14/18 1420  AMMONIA 43* 37*   Coagulation Profile: Recent Labs  Lab 12/11/18 1600  INR 1.4*   Cardiac Enzymes: No results for input(s): CKTOTAL, CKMB, CKMBINDEX, TROPONINI in the last 168 hours. BNP (last 3 results) No results for input(s): PROBNP in the last 8760 hours. HbA1C: No results for input(s): HGBA1C in the last 72 hours. CBG: Recent Labs  Lab 12/14/18 2335 12/15/18 0342 12/15/18 0944 12/15/18 1133 12/15/18 1759  GLUCAP 215* 253* 261* 228* 206*   Lipid Profile: No results for input(s): CHOL, HDL, LDLCALC, TRIG, CHOLHDL, LDLDIRECT in the last 72 hours. Thyroid Function Tests: No results for input(s): TSH, T4TOTAL, FREET4, T3FREE, THYROIDAB in the last 72 hours. Anemia Panel: No results for input(s): VITAMINB12, FOLATE, FERRITIN, TIBC, IRON, RETICCTPCT in the last 72 hours. Sepsis Labs: Recent Labs  Lab 12/11/18 1700 12/11/18 1802 12/14/18 1420  LATICACIDVEN 2.2* 2.3* 1.5    Recent Results (from the past 240 hour(s))  SARS Coronavirus 2 (CEPHEID - Performed in [place] hospital lab), Hosp Order     Status: None   Collection Time: 12/11/18  5:00 PM   Specimen: Nasopharyngeal Swab  Result Value Ref Range Status   SARS Coronavirus 2 NEGATIVE NEGATIVE Final    Comment: (NOTE) If result is NEGATIVE SARS-CoV-2 target nucleic acids are NOT DETECTED. The SARS-CoV-2 RNA is generally detectable in upper and lower  respiratory specimens during the acute phase of infection. The lowest  concentration of SARS-CoV-2 viral copies this assay can detect is 250  copies / mL. A negative result does not preclude SARS-CoV-2 infection  and should not be used as the sole basis for treatment or other  patient management decisions.  A negative result may occur with  improper specimen collection / handling, submission of specimen other  than  nasopharyngeal swab, presence of viral mutation(s) within the  areas targeted by this assay, and inadequate number of viral copies  (<250 copies / mL). A negative result must be combined with clinical  observations, patient history, and epidemiological information. If result is POSITIVE SARS-CoV-2 target nucleic acids are DETECTED. The SARS-CoV-2 RNA is generally detectable in upper and lower  respiratory specimens dur ing the acute phase of infection.  Positive  results are indicative of active infection with SARS-CoV-2.  Clinical  correlation with patient history and other diagnostic information is  necessary to determine patient infection status.  Positive results do  not rule out bacterial infection or co-infection with other viruses. If result is PRESUMPTIVE POSTIVE SARS-CoV-2 nucleic acids MAY  BE PRESENT.   A presumptive positive result was obtained on the submitted specimen  and confirmed on repeat testing.  While 2019 novel coronavirus  (SARS-CoV-2) nucleic acids may be present in the submitted sample  additional confirmatory testing may be necessary for epidemiological  and / or clinical management purposes  to differentiate between  SARS-CoV-2 and other Sarbecovirus currently known to infect humans.  If clinically indicated additional testing with an alternate test  methodology [phone]) is advised. The SARS-CoV-2 RNA is generally  detectable in upper and lower respiratory sp ecimens during the acute  phase of infection. The expected result is Negative. Fact Sheet for Patients:  StrictlyIdeas.no Fact Sheet for Healthcare Providers: BankingDealers.co.za This test is not yet approved or cleared by the [place] FDA and has been authorized for detection and/or diagnosis of SARS-CoV-2 by FDA under an Emergency Use Authorization (EUA).  This EUA will remain in effect (meaning this test can be used) for the duration of the  COVID-19 declaration under Section 564(b)(1) of the Act, 21 U.S.C. section 360bbb-3(b)(1), unless the authorization is terminated or revoked sooner. Performed at [facility], [place] [address] [place], [place] [address]   Culture, blood (routine x 2)     Status: None (Preliminary result)   Collection Time: 12/11/18  5:00 PM   Specimen: BLOOD  Result Value Ref Range Status   Specimen Description BLOOD RIGHT ANTECUBITAL  Final   Special Requests   Final    BOTTLES DRAWN AEROBIC AND ANAEROBIC Blood Culture adequate volume   Culture   Final    NO GROWTH 4 DAYS Performed at [facility], [place] [address], [place], [place] [address]    Report Status PENDING  Incomplete  Culture, blood (routine x 2)     Status: None (Preliminary result)   Collection Time: 12/11/18  5:58 PM   Specimen: BLOOD RIGHT HAND  Result Value Ref Range Status   Specimen Description BLOOD RIGHT HAND  Final   Special Requests   Final    BOTTLES DRAWN AEROBIC AND ANAEROBIC Blood Culture results may not be optimal due to an inadequate volume of blood received in culture bottles   Culture   Final    NO GROWTH 4 DAYS Performed at [facility], [place] [address] [place], [place] [address]    Report Status PENDING  Incomplete  MRSA PCR Screening     Status: None   Collection Time: 12/12/18 12:28 AM   Specimen: Nasal Mucosa; Nasopharyngeal  Result Value Ref Range Status   MRSA by PCR NEGATIVE NEGATIVE Final    Comment:        The GeneXpert MRSA Assay (FDA approved for NASAL specimens only), is one component of a comprehensive MRSA colonization surveillance program. It is not intended to diagnose MRSA infection nor to guide or monitor treatment for MRSA infections. Performed at [facility], [place] [address] [place], [place] [address]   Urine culture     Status: Abnormal   Collection Time: 12/12/18  5:23 AM   Specimen: Urine, Random  Result Value Ref Range Status   Specimen Description  URINE, RANDOM  Final   Special Requests   Final    NONE Performed at [facility], [place] [address] [place], [place] [address]    Culture MULTIPLE SPECIES PRESENT, SUGGEST RECOLLECTION (A)  Final   Report Status 12/13/2018 FINAL  Final         Radiology Studies: No results found.  Scheduled Meds: . aspirin  300 mg Rectal Daily   Or  . aspirin  325 mg Oral Daily  . chlorhexidine  15 mL Mouth Rinse BID  . dorzolamide  1 drop Both Eyes BID  . enoxaparin (LOVENOX) injection  40 mg Subcutaneous Q24H  . insulin aspart  0-9 Units Subcutaneous Q4H  . mouth rinse  15 mL Mouth Rinse q12n4p  . metoprolol tartrate  5 mg Intravenous Q6H  . nicotine  21 mg Transdermal Daily  . potassium chloride  10 mEq Oral Daily   Continuous Infusions: . cefTRIAXone (ROCEPHIN)  IV 1 g (12/14/18 2006)  . dextrose 125 mL/hr at 12/14/18 1747  . lacosamide (VIMPAT) IV 100 mg (12/15/18 1212)  . levETIRAcetam 1,000 mg (12/15/18 1313)     LOS: 3 days    Time spent: 28mins    Kathie Dike, MD [facility]   If 7PM-7AM, please contact night-coverage [email or web address]  12/15/2018, 7:17 PM

## 2018-12-16 ENCOUNTER — Encounter: Payer: Self-pay | Admitting: Internal Medicine

## 2018-12-16 ENCOUNTER — Inpatient Hospital Stay: Payer: Self-pay

## 2018-12-16 DIAGNOSIS — Z8673 Personal history of transient ischemic attack (TIA), and cerebral infarction without residual deficits: Secondary | ICD-10-CM | POA: Insufficient documentation

## 2018-12-16 LAB — BASIC METABOLIC PANEL
Anion gap: 7 (ref 5–15)
BUN: 17 mg/dL (ref 8–23)
CO2: 22 mmol/L (ref 22–32)
Calcium: 8.9 mg/dL (ref 8.9–10.3)
Chloride: 114 mmol/L — ABNORMAL HIGH (ref 98–111)
Creatinine, Ser: 0.98 mg/dL (ref 0.61–1.24)
GFR calc Af Amer: >60 mL/min (ref >60)
GFR calc non Af Amer: >60 mL/min (ref >60)
Glucose, Bld: 233 mg/dL — ABNORMAL HIGH (ref 70–99)
Potassium: 3.7 mmol/L (ref 3.5–5.1)
Sodium: 143 mmol/L (ref 135–145)

## 2018-12-16 LAB — CULTURE, BLOOD (ROUTINE X 2)
Culture: NO GROWTH
Culture: NO GROWTH
Special Requests: ADEQUATE

## 2018-12-16 LAB — CBC
HCT: 26.8 % — ABNORMAL LOW (ref 39.0–52.0)
Hemoglobin: 7.7 g/dL — ABNORMAL LOW (ref 13.0–17.0)
MCH: 23.8 pg — ABNORMAL LOW (ref 26.0–34.0)
MCHC: 28.7 g/dL — ABNORMAL LOW (ref 30.0–36.0)
MCV: 82.7 fL (ref 80.0–100.0)
Platelets: 242 10*3/uL (ref 150–400)
RBC: 3.24 MIL/uL — ABNORMAL LOW (ref 4.22–5.81)
RDW: 15.7 % — ABNORMAL HIGH (ref 11.5–15.5)
WBC: 10.4 10*3/uL (ref 4.0–10.5)
nRBC: 0.2 % (ref 0.0–0.2)

## 2018-12-16 LAB — FOLATE: Folate: 4.3 ng/mL — ABNORMAL LOW (ref >5.9)

## 2018-12-16 LAB — RETICULOCYTES
Immature Retic Fract: 27.5 % — ABNORMAL HIGH (ref 2.3–15.9)
RBC.: 3.24 MIL/uL — ABNORMAL LOW (ref 4.22–5.81)
Retic Count, Absolute: 47.3 10*3/uL (ref 19.0–186.0)
Retic Ct Pct: 1.5 % (ref 0.4–3.1)

## 2018-12-16 LAB — GLUCOSE, CAPILLARY
Glucose-Capillary: 158 mg/dL — ABNORMAL HIGH (ref 70–99)
Glucose-Capillary: 172 mg/dL — ABNORMAL HIGH (ref 70–99)
Glucose-Capillary: 181 mg/dL — ABNORMAL HIGH (ref 70–99)
Glucose-Capillary: 191 mg/dL — ABNORMAL HIGH (ref 70–99)
Glucose-Capillary: 199 mg/dL — ABNORMAL HIGH (ref 70–99)
Glucose-Capillary: 207 mg/dL — ABNORMAL HIGH (ref 70–99)

## 2018-12-16 LAB — FERRITIN: Ferritin: 1615 ng/mL — ABNORMAL HIGH (ref 24–336)

## 2018-12-16 LAB — IRON AND TIBC
Iron: 16 ug/dL — ABNORMAL LOW (ref 45–182)
Saturation Ratios: 9 % — ABNORMAL LOW (ref 17.9–39.5)
TIBC: 183 ug/dL — ABNORMAL LOW (ref 250–450)
UIBC: 167 ug/dL

## 2018-12-16 LAB — VITAMIN B12: Vitamin B-12: 390 pg/mL (ref 180–914)

## 2018-12-16 MED ORDER — DEXTROSE 5 % IV SOLN
INTRAVENOUS | Status: DC
  Administered 2018-12-16 – 2018-12-19 (×5): via INTRAVENOUS

## 2018-12-16 NOTE — Progress Notes (Signed)
Secure texted Dr. Roderic Palau concerning PICC order. PIV placed by [facility]. Dr. Roderic Palau agreed to hold off for now, as patient has sufficient IV access. PICC to be discontinued. Order to be replaced if need for PICC emerges.

## 2018-12-16 NOTE — Progress Notes (Signed)
PROGRESS NOTE    Julian Tyler  GUY:[id number] DOB: [birth year] DOA: 12/11/2018 PCP: Milana Na., MD    Brief Narrative:  73 year old male with a history of previous stroke, hypertension, diabetes, seizure disorder, dementia who is a resident of a skilled nursing facility, was brought to the hospital with worsening mental status.  Found to be significantly dehydrated, hypernatremic, with acute kidney injury.  Possible urinary tract infection, started on antibiotics.  He was also recently started on Seroquel, Xanax and Depakote for combativeness.  He has a history of seizures and is on Keppra and Vimpat.  Neurology followed him during his hospital stay.   Assessment & Plan:   Principal Problem:   Altered mental status Active Problems:   Diabetes mellitus type 2, uncontrolled (HCC)   Goals of care, counseling/discussion   Severe protein-calorie malnutrition (HCC)   Anemia   Hypernatremia   ARF (acute renal failure) (HCC)   Tachycardia   Lactic acidosis   AMS (altered mental status)   Palliative care by specialist   Metabolic encephalopathy   Vascular dementia without behavioral disturbance ([place])   1. Acute metabolic encephalopathy, superimposed on baseline dementia.  Suspect this is related to dehydration and hypernatremia.  Urinalysis indicates possible infection, so he has been started on antibiotics.  Urine culture showed non specific growth.  Ammonia is unremarkable.  PCO2 on blood gases also unrevealing.  MRI Imaging of the brain did not show any acute findings and EEG does not show any epileptiform discharges.  Neurology following, now signed off. He has not been receiving any sedative medications. Appears to be slowly improving, although not near baseline. 2. Hypernatremia.  Related to decreased p.o. intake.  Continue gentle hydration with hypotonic fluids. Sodium has normalized since admission. 3. History of seizures.  Since patient is very lethargic and not able to  take medications by mouth.  Continue antiepileptics with Keppra, Vimpat via IV route.  4. Acute kidney injury.  Creatinine was normal when discharged from [facility] last month.  Decline in renal function related to dehydration. Overall creatinine has improved with hydration. 5. Diabetes.  Metformin on hold due to renal failure.  Continue on sliding scale insulin.  Blood sugar stable. 6. Hypertension.  Oral metoprolol on hold since he is unable to take by mouth.  Continue on IV Lopressor. 7. Dementia, likely vascular, supportive care 8. Severe protein calorie malnurtition/Failure to thrive. Once it has been determined that patient can swallow, will request nutrition input. If this does not occur in the next day of two, will need to consider Cortrak placement 9. Abnormal Echo. Echo reports possible mobile density on aortic valve. Blood cultures have shown no growth and he has been afebrile. Can consider TEE for further characterization if his condition starts to improve 10. Goals of care.  Patient appears to have had a decline over the past 1 to 2 months since his admission to [facility].  He has had failure to thrive and decreased p.o. intake resulting in severe dehydration/hypernatremia and protein calorie malnutrition.  Currently, his son wishes for patient's CODE STATUS to be full code. Palliative care is assisting in goals of care discussion. Meeting was had on 7/4 with several of patient's children, his wife and other family members over conference phone line. We discussed code status, advance directives including feeding tubes and what his wishes would be around end of life. They are not ready to make any decisions today and need to talk it over amongst themselves. Further  discussions planned in the coming week.   DVT prophylaxis: Lovenox Code Status: Full code Family Communication: Discussed with son [name] 7/5 Disposition Plan: Return to skilled nursing facility  on discharge   Consultants:   Neurology  Palliative care  Procedures:   EEG: This is an abnormal EEG secondary to general background slowing.  This finding may be seen with a diffuse disturbance that is etiologically nonspecific, but may include a metabolic encephalopathy, among other possibilities.  Despite the patient having multiple episodes with upper extremity shaking and moaning, no epileptiform activity is noted.   Echo: 1. The left ventricle has normal systolic function with an ejection fraction of 60-65%. The cavity size was normal. There is mildly increased left ventricular wall thickness. Left ventricular diastolic Doppler parameters are consistent with impaired  relaxation. No evidence of left ventricular regional wall motion abnormalities.  2. The right ventricle has normal systolic function. The cavity was normal. There is no increase in right ventricular wall thickness.  3. Trivial pericardial effusion is present.  4. Mild calcification of the mitral valve leaflet. There is mild mitral annular calcification present. No evidence of mitral valve stenosis. No significant mitral regurgitation.  5. The aortic valve is tricuspid. Moderate calcification of the aortic valve. No stenosis of the aortic valve. In the parasternal long axis view, there appears to be a small mobile mass on the atrial size of the aortic valve, possible vegetation. Poor  images on this study, cannot see aortic valve well in other views. Would suggest TEE to evaluate more closely.  6. The aortic root is normal in size and structure.  7. IVC was normal in size. PA systolic pressure 35 mmHg.   8. Technically difficult study with poor acoustic windows.  Antimicrobials:   Ceftriaxone 7/1 >7/4   Subjective: Opens eyes to voice, speech difficult to comprehend, mostly mumbling. Minimal movement in right hand when he is asked to move it  Objective: Vitals:   12/16/18 0700 12/16/18 1100 12/16/18 1500 12/16/18  1640  BP: (!) 157/68 (!) 162/73 (!) 156/63 (!) 175/69  Pulse: 90 (!) 103 92   Resp: 18 18 (!) 21   Temp: 99.3 F (37.4 C) 98 F (36.7 C) 99.7 F (37.6 C)   TempSrc: Oral Axillary Axillary   SpO2: 100% 99% 100%     Intake/Output Summary (Last 24 hours) at 12/16/2018 1657 Last data filed at 12/16/2018 1500 Gross per 24 hour  Intake -  Output 1000 ml  Net -1000 ml   There were no vitals filed for this visit.  Examination:  General exam: laying in bed, lethargic, opens eyes to voice Respiratory system: Clear to auscultation. Respiratory effort normal. Cardiovascular system:RRR. No murmurs, rubs, gallops. Gastrointestinal system: Abdomen is nondistended, soft and nontender. No organomegaly or masses felt. Normal bowel sounds heard. Central nervous system: right sided facial droop. Minimal participation in exam. Speech is dysarthric Extremities: 1+ Skin: No rashes, lesions or ulcers Psychiatry: somnolent    Data Reviewed: I have personally reviewed following labs and imaging studies  CBC: Recent Labs  Lab 12/11/18 1600  12/12/18 1525 12/12/18 1526 12/13/18 0411 12/14/18 0416 12/15/18 0807 12/16/18 0729  WBC 14.2*  --  12.7*  --  13.0* 10.9* 9.7 10.4  NEUTROABS 10.9*  --   --   --   --   --   --   --   HGB 9.1*   < > 9.4*  --  9.0* 9.2* 8.4* 7.7*  HCT 32.3*   < >  32.7* 30.2* 31.7* 32.6* 29.5* 26.8*  MCV 83.0  --  82.4  --  82.8 83.6 83.6 82.7  PLT 264  --  276  --  266 264 274 242   < > = values in this interval not displayed.   Basic Metabolic Panel: Recent Labs  Lab 12/12/18 0500 12/13/18 0411 12/14/18 0416 12/15/18 0807 12/16/18 0729  NA 157* 158* 156* 150* 143  K 4.2 4.0 4.2 4.0 3.7  CL 123* 127* 126* 120* 114*  CO2 18* 22 20* 23 22  GLUCOSE 227* 229* 233* 263* 233*  BUN 64* 53* 41* 26* 17  CREATININE 1.63* 1.40* 1.29* 1.09 0.98  CALCIUM 9.6 9.7 9.5 9.1 8.9   GFR: Estimated Creatinine Clearance: 85.5 mL/min (by C-G formula based on SCr of 0.98 mg/dL).  Liver Function Tests: Recent Labs  Lab 12/11/18 1600 12/12/18 0500 12/14/18 0416  AST 34 31 21  ALT 20 22 18   ALKPHOS 53 62 59  BILITOT 1.0 0.9 0.5  PROT 8.0 8.3* 7.9  ALBUMIN 2.4* 2.4* 2.3*   No results for input(s): LIPASE, AMYLASE in the last 168 hours. Recent Labs  Lab 12/12/18 1525 12/14/18 1420  AMMONIA 43* 37*   Coagulation Profile: Recent Labs  Lab 12/11/18 1600  INR 1.4*   Cardiac Enzymes: No results for input(s): CKTOTAL, CKMB, CKMBINDEX, TROPONINI in the last 168 hours. BNP (last 3 results) No results for input(s): PROBNP in the last 8760 hours. HbA1C: No results for input(s): HGBA1C in the last 72 hours. CBG: Recent Labs  Lab 12/15/18 2339 12/16/18 0400 12/16/18 0848 12/16/18 1252 12/16/18 1603  GLUCAP 206* 199* 207* 191* 181*   Lipid Profile: No results for input(s): CHOL, HDL, LDLCALC, TRIG, CHOLHDL, LDLDIRECT in the last 72 hours. Thyroid Function Tests: No results for input(s): TSH, T4TOTAL, FREET4, T3FREE, THYROIDAB in the last 72 hours. Anemia Panel: Recent Labs    12/16/18 0729  VITAMINB12 390  FOLATE 4.3*  FERRITIN 1,615*  TIBC 183*  IRON 16*  RETICCTPCT 1.5   Sepsis Labs: Recent Labs  Lab 12/11/18 1700 12/11/18 1802 12/14/18 1420  LATICACIDVEN 2.2* 2.3* 1.5    Recent Results (from the past 240 hour(s))  SARS Coronavirus 2 (CEPHEID - Performed in [facility] hospital lab), Hosp Order     Status: None   Collection Time: 12/11/18  5:00 PM   Specimen: Nasopharyngeal Swab  Result Value Ref Range Status   SARS Coronavirus 2 NEGATIVE NEGATIVE Final    Comment: (NOTE) If result is NEGATIVE SARS-CoV-2 target nucleic acids are NOT DETECTED. The SARS-CoV-2 RNA is generally detectable in upper and lower  respiratory specimens during the acute phase of infection. The lowest  concentration of SARS-CoV-2 viral copies this assay can detect is 250  copies / mL. A negative result does not preclude SARS-CoV-2 infection  and should not  be used as the sole basis for treatment or other  patient management decisions.  A negative result may occur with  improper specimen collection / handling, submission of specimen other  than nasopharyngeal swab, presence of viral mutation(s) within the  areas targeted by this assay, and inadequate number of viral copies  (<250 copies / mL). A negative result must be combined with clinical  observations, patient history, and epidemiological information. If result is POSITIVE SARS-CoV-2 target nucleic acids are DETECTED. The SARS-CoV-2 RNA is generally detectable in upper and lower  respiratory specimens dur ing the acute phase of infection.  Positive  results are indicative of active infection  with SARS-CoV-2.  Clinical  correlation with patient history and other diagnostic information is  necessary to determine patient infection status.  Positive results do  not rule out bacterial infection or co-infection with other viruses. If result is PRESUMPTIVE POSTIVE SARS-CoV-2 nucleic acids MAY BE PRESENT.   A presumptive positive result was obtained on the submitted specimen  and confirmed on repeat testing.  While 2019 novel coronavirus  (SARS-CoV-2) nucleic acids may be present in the submitted sample  additional confirmatory testing may be necessary for epidemiological  and / or clinical management purposes  to differentiate between  SARS-CoV-2 and other Sarbecovirus currently known to infect humans.  If clinically indicated additional testing with an alternate test  methodology [phone]) is advised. The SARS-CoV-2 RNA is generally  detectable in upper and lower respiratory sp ecimens during the acute  phase of infection. The expected result is Negative. Fact Sheet for Patients:  StrictlyIdeas.no Fact Sheet for Healthcare Providers: BankingDealers.co.za This test is not yet approved or cleared by the [place] FDA and has been authorized  for detection and/or diagnosis of SARS-CoV-2 by FDA under an Emergency Use Authorization (EUA).  This EUA will remain in effect (meaning this test can be used) for the duration of the COVID-19 declaration under Section 564(b)(1) of the Act, 21 U.S.C. section 360bbb-3(b)(1), unless the authorization is terminated or revoked sooner. Performed at [facility], [place] [address] [place], [place] [address]   Culture, blood (routine x 2)     Status: None   Collection Time: 12/11/18  5:00 PM   Specimen: BLOOD  Result Value Ref Range Status   Specimen Description BLOOD RIGHT ANTECUBITAL  Final   Special Requests   Final    BOTTLES DRAWN AEROBIC AND ANAEROBIC Blood Culture adequate volume   Culture   Final    NO GROWTH 5 DAYS Performed at [facility], [place] [address] [place], [place] [address]    Report Status 12/16/2018 FINAL  Final  Culture, blood (routine x 2)     Status: None   Collection Time: 12/11/18  5:58 PM   Specimen: BLOOD RIGHT HAND  Result Value Ref Range Status   Specimen Description BLOOD RIGHT HAND  Final   Special Requests   Final    BOTTLES DRAWN AEROBIC AND ANAEROBIC Blood Culture results may not be optimal due to an inadequate volume of blood received in culture bottles   Culture   Final    NO GROWTH 5 DAYS Performed at [facility], [place] [address] [place], [place] [address]    Report Status 12/16/2018 FINAL  Final  MRSA PCR Screening     Status: None   Collection Time: 12/12/18 12:28 AM   Specimen: Nasal Mucosa; Nasopharyngeal  Result Value Ref Range Status   MRSA by PCR NEGATIVE NEGATIVE Final    Comment:        The GeneXpert MRSA Assay (FDA approved for NASAL specimens only), is one component of a comprehensive MRSA colonization surveillance program. It is not intended to diagnose MRSA infection nor to guide or monitor treatment for MRSA infections. Performed at [facility], [place] [address], [place], [place] [address]   Urine  culture     Status: Abnormal   Collection Time: 12/12/18  5:23 AM   Specimen: Urine, Random  Result Value Ref Range Status   Specimen Description URINE, RANDOM  Final   Special Requests   Final    NONE Performed at [facility], [place] [address] [place], [place] [address]    Culture MULTIPLE SPECIES PRESENT, SUGGEST RECOLLECTION (A)  Final   Report Status 12/13/2018 FINAL  Final         Radiology Studies: [place] Ekg Site Rite  Result Date: 12/16/2018 If Site Rite image not attached, placement could not be confirmed due to current cardiac rhythm.       Scheduled Meds: . aspirin  300 mg Rectal Daily   Or  . aspirin  325 mg Oral Daily  . chlorhexidine  15 mL Mouth Rinse BID  . dorzolamide  1 drop Both Eyes BID  . enoxaparin (LOVENOX) injection  40 mg Subcutaneous Q24H  . insulin aspart  0-9 Units Subcutaneous Q4H  . mouth rinse  15 mL Mouth Rinse q12n4p  . metoprolol tartrate  5 mg Intravenous Q6H  . nicotine  21 mg Transdermal Daily  . potassium chloride  10 mEq Oral Daily   Continuous Infusions: . dextrose    . lacosamide (VIMPAT) IV 100 mg (12/16/18 0924)  . levETIRAcetam 1,000 mg (12/16/18 1312)     LOS: 4 days    Time spent: 8mins    Kathie Dike, MD [facility]   If 7PM-7AM, please contact night-coverage [email or web address]  12/16/2018, 4:57 PM

## 2018-12-16 NOTE — Progress Notes (Signed)
Pt lost IV access, MD made aware that fluid and Keppra is paused at this time. IV team consulted.

## 2018-12-17 DIAGNOSIS — Z515 Encounter for palliative care: Secondary | ICD-10-CM

## 2018-12-17 DIAGNOSIS — Z7189 Other specified counseling: Secondary | ICD-10-CM

## 2018-12-17 LAB — UPEP/UIFE/LIGHT CHAINS/TP, 24-HR UR
% BETA, Urine: 18.3 %
ALPHA 1 URINE: 3 %
Albumin, U: 26.6 %
Alpha 2, Urine: 19.7 %
Free Kappa Lt Chains,Ur: 474.91 mg/L — ABNORMAL HIGH (ref 0.63–113.79)
Free Kappa/Lambda Ratio: 4.65 (ref 1.03–31.76)
Free Lambda Lt Chains,Ur: 102.11 mg/L — ABNORMAL HIGH (ref 0.47–11.77)
GAMMA GLOBULIN URINE: 32.4 %
Total Protein, Urine: 26 mg/dL

## 2018-12-17 LAB — FERRITIN: Ferritin: 1506 ng/mL — ABNORMAL HIGH (ref 24–336)

## 2018-12-17 LAB — GLUCOSE, CAPILLARY
Glucose-Capillary: 150 mg/dL — ABNORMAL HIGH (ref 70–99)
Glucose-Capillary: 158 mg/dL — ABNORMAL HIGH (ref 70–99)
Glucose-Capillary: 164 mg/dL — ABNORMAL HIGH (ref 70–99)
Glucose-Capillary: 165 mg/dL — ABNORMAL HIGH (ref 70–99)
Glucose-Capillary: 168 mg/dL — ABNORMAL HIGH (ref 70–99)
Glucose-Capillary: 169 mg/dL — ABNORMAL HIGH (ref 70–99)

## 2018-12-17 LAB — COMPREHENSIVE METABOLIC PANEL
ALT: 24 U/L (ref 0–44)
AST: 41 U/L (ref 15–41)
Albumin: 1.9 g/dL — ABNORMAL LOW (ref 3.5–5.0)
Alkaline Phosphatase: 63 U/L (ref 38–126)
Anion gap: 8 (ref 5–15)
BUN: 13 mg/dL (ref 8–23)
CO2: 24 mmol/L (ref 22–32)
Calcium: 9 mg/dL (ref 8.9–10.3)
Chloride: 114 mmol/L — ABNORMAL HIGH (ref 98–111)
Creatinine, Ser: 0.92 mg/dL (ref 0.61–1.24)
GFR calc Af Amer: >60 mL/min (ref >60)
GFR calc non Af Amer: >60 mL/min (ref >60)
Glucose, Bld: 172 mg/dL — ABNORMAL HIGH (ref 70–99)
Potassium: 3.6 mmol/L (ref 3.5–5.1)
Sodium: 146 mmol/L — ABNORMAL HIGH (ref 135–145)
Total Bilirubin: 0.9 mg/dL (ref 0.3–1.2)
Total Protein: 6.7 g/dL (ref 6.5–8.1)

## 2018-12-17 LAB — RETICULOCYTES
Immature Retic Fract: 29.9 % — ABNORMAL HIGH (ref 2.3–15.9)
RBC.: 3.15 MIL/uL — ABNORMAL LOW (ref 4.22–5.81)
Retic Count, Absolute: 57 10*3/uL (ref 19.0–186.0)
Retic Ct Pct: 1.8 % (ref 0.4–3.1)

## 2018-12-17 LAB — CBC
HCT: 25.8 % — ABNORMAL LOW (ref 39.0–52.0)
Hemoglobin: 7.5 g/dL — ABNORMAL LOW (ref 13.0–17.0)
MCH: 23.8 pg — ABNORMAL LOW (ref 26.0–34.0)
MCHC: 29.1 g/dL — ABNORMAL LOW (ref 30.0–36.0)
MCV: 81.9 fL (ref 80.0–100.0)
Platelets: 221 10*3/uL (ref 150–400)
RBC: 3.15 MIL/uL — ABNORMAL LOW (ref 4.22–5.81)
RDW: 15.4 % (ref 11.5–15.5)
WBC: 10.7 10*3/uL — ABNORMAL HIGH (ref 4.0–10.5)
nRBC: 0.2 % (ref 0.0–0.2)

## 2018-12-17 LAB — IRON AND TIBC
Iron: 14 ug/dL — ABNORMAL LOW (ref 45–182)
Saturation Ratios: 8 % — ABNORMAL LOW (ref 17.9–39.5)
TIBC: 182 ug/dL — ABNORMAL LOW (ref 250–450)
UIBC: 168 ug/dL

## 2018-12-17 LAB — RPR, QUANT+TP ABS (REFLEX)
Rapid Plasma Reagin, Quant: 1:2 {titer} — ABNORMAL HIGH
T Pallidum Abs: REACTIVE — AB

## 2018-12-17 LAB — FOLATE: Folate: 4.5 ng/mL — ABNORMAL LOW (ref >5.9)

## 2018-12-17 LAB — VITAMIN B12: Vitamin B-12: 360 pg/mL (ref 180–914)

## 2018-12-17 LAB — RPR: RPR Ser Ql: REACTIVE — AB

## 2018-12-17 NOTE — Care Management Important Message (Signed)
Important Message  Patient Details  Name: Menelik Mcfarren MRN: [id number] Date of Birth: 1945-08-18   Medicare Important Message Given:  Yes  Due to illness patient not able to sign.  Verbal consent given to sign on patient behalf    Dacian [address] 12/17/2018, 1:23 PM

## 2018-12-17 NOTE — Progress Notes (Signed)
  Speech Language Pathology Treatment: Cognitive-Linquistic  Patient Details Name: Julian Tyler MRN: [id number] DOB: [birth year] Today's Date: 12/17/2018 Time: 4128-7867 SLP Time Calculation (min) (ACUTE ONLY): 11 min  Assessment / Plan / Recommendation Clinical Impression  Pt was seen for treatment and his level of alertness was improved compared to that which was noted on 12/14/18. However, he demonstrated difficulty maintaining alertness for prolonged periods and the session was ultimately terminated prematurely for this reason. He was oriented to person but not to place or time. He was re-oriented to place but still maintained that he was at a train station. Articulatory precision was improved but speech intelligibility remains reduced. Pt reported that his speech is currently at baseline; however, his relibility is strongly questioned at this time. He was educated regarding compensatory strategies to improve speech intelligibility and required max cues to recall and utilize strategies. An order for bedside swallow evaluation has been placed today by RN. However, considering his reduced level of alertness, SLP will follow up on a subsequent date for swallowing assessment and will continue to treatment.     HPI HPI: Pt is a 73 y.o. male, w hypertension, hyperlipidemia, dm2, h/o stroke , seizure, dementia, who presented with altered mental status. Per nurse, pt had right side deficit, not able to walk by self, ? Slight slurred speech. Pt recently had seroquel , xanax, and depakote added due to combativeness. MRI of the brain was negative. Acute metabolic encephalopathy suspect to be secondary to to dehydration and hypernatremia. Urinalysis indicated possible infection; he was started on antibiotics.       SLP Plan  Continue with current plan of care       Recommendations                   Follow up Recommendations: Skilled Nursing facility;24 hour supervision/assistance SLP Visit  Diagnosis: Dysarthria and anarthria (R47.1) Plan: Continue with current plan of care       Julian Tyler, [place], [place] Office number [phone] Pager [place] 12/17/2018, 5:27 PM

## 2018-12-17 NOTE — Progress Notes (Signed)
PROGRESS NOTE    Julian Tyler  QMG:[id number] DOB: Sep 01, 1945 DOA: 12/11/2018 PCP: Milana Na., MD    Brief Narrative:  73 year old male with a history of previous stroke, hypertension, diabetes, seizure disorder, dementia who is a resident of a skilled nursing facility, was brought to the hospital with worsening mental status.  Found to be significantly dehydrated, hypernatremic, with acute kidney injury.  Possible urinary tract infection, started on antibiotics.  He was also recently started on Seroquel, Xanax and Depakote for combativeness.  He has a history of seizures and is on Keppra and Vimpat.  Neurology followed him during his hospital stay.   Assessment & Plan:   Principal Problem:   Altered mental status Active Problems:   Diabetes mellitus type 2, uncontrolled (HCC)   Goals of care, counseling/discussion   Severe protein-calorie malnutrition (HCC)   Anemia   Hypernatremia   ARF (acute renal failure) (HCC)   Tachycardia   Lactic acidosis   AMS (altered mental status)   Palliative care by specialist   Metabolic encephalopathy   Vascular dementia without behavioral disturbance ([place])   1. Acute metabolic encephalopathy, superimposed on baseline dementia.  Suspect this is related to dehydration and hypernatremia.  Urinalysis indicates possible infection, so he has been started on antibiotics.  Urine culture showed non specific growth.  Ammonia is unremarkable.  PCO2 on blood gases also unrevealing.  MRI Imaging of the brain did not show any acute findings and EEG does not show any epileptiform discharges.  Neurology following, now signed off. He has not been receiving any sedative medications.  He is slowly improving. 2. Hypernatremia.  Related to decreased p.o. intake.  Continue gentle hydration with hypotonic fluids. Sodium has normalized since admission. 3. History of seizures.  Since patient is very lethargic and not able to take medications by mouth.  Continue  antiepileptics with Keppra, Vimpat via IV route.  4. Acute kidney injury.  Creatinine was normal when discharged from [facility] last month.  Decline in renal function related to dehydration. Overall creatinine has improved with hydration. 5. Diabetes.  Metformin on hold due to renal failure.  Continue on sliding scale insulin.  Blood sugar stable. 6. Hypertension.  Oral metoprolol on hold since he is unable to take by mouth.  Continue on IV Lopressor. 7. Dementia, likely vascular, supportive care 8. Severe protein calorie malnurtition/Failure to thrive. Once it has been determined that patient can swallow, will request nutrition input. If this does not occur in the next day of two, will need to consider Cortrak placement 9. Abnormal Echo. Echo reports possible mobile density on aortic valve. Blood cultures have shown no growth and he has been afebrile. Can consider TEE for further characterization if his condition starts to improve 10. Anemia.  Likely related to chronic disease.  No signs of bleeding at this time.  Monitor hemoglobin and if continues to climb, consider transfusion. 11. Goals of care.  Patient appears to have had a decline over the past 1 to 2 months since his admission to [facility].  He has had failure to thrive and decreased p.o. intake resulting in severe dehydration/hypernatremia and protein calorie malnutrition.  Currently, his son wishes for patient's CODE STATUS to be full code. Palliative care is assisting in goals of care discussion. Meeting was had on 7/4 with several of patient's children, his wife and other family members over conference phone line. We discussed code status, advance directives including feeding tubes and what his wishes would be  around end of life. They are not ready to make any decisions today and need to talk it over amongst themselves. Further discussions planned in the coming week.   DVT prophylaxis: Lovenox Code Status: Full  code Family Communication: Discussed with son [name] 7/5 Disposition Plan: Return to skilled nursing facility on discharge   Consultants:   Neurology  Palliative care  Procedures:   EEG: This is an abnormal EEG secondary to general background slowing.  This finding may be seen with a diffuse disturbance that is etiologically nonspecific, but may include a metabolic encephalopathy, among other possibilities.  Despite the patient having multiple episodes with upper extremity shaking and moaning, no epileptiform activity is noted.   Echo: 1. The left ventricle has normal systolic function with an ejection fraction of 60-65%. The cavity size was normal. There is mildly increased left ventricular wall thickness. Left ventricular diastolic Doppler parameters are consistent with impaired  relaxation. No evidence of left ventricular regional wall motion abnormalities.  2. The right ventricle has normal systolic function. The cavity was normal. There is no increase in right ventricular wall thickness.  3. Trivial pericardial effusion is present.  4. Mild calcification of the mitral valve leaflet. There is mild mitral annular calcification present. No evidence of mitral valve stenosis. No significant mitral regurgitation.  5. The aortic valve is tricuspid. Moderate calcification of the aortic valve. No stenosis of the aortic valve. In the parasternal long axis view, there appears to be a small mobile mass on the atrial size of the aortic valve, possible vegetation. Poor  images on this study, cannot see aortic valve well in other views. Would suggest TEE to evaluate more closely.  6. The aortic root is normal in size and structure.  7. IVC was normal in size. PA systolic pressure 35 mmHg.   8. Technically difficult study with poor acoustic windows.  Antimicrobials:   Ceftriaxone 7/1 >7/4   Subjective: Opens eyes to voice, more awake than yesterday, speech is difficult to  comprehend.  Objective: Vitals:   12/17/18 0304 12/17/18 0701 12/17/18 1100 12/17/18 1500  BP: (!) 162/68 (!) 159/70 (!) 161/61 (!) 181/68  Pulse: 97 88 (!) 105   Resp:    (!) 23  Temp: 98.9 F (37.2 C) 99.4 F (37.4 C) 100.3 F (37.9 C) 99.5 F (37.5 C)  TempSrc: Oral Axillary Axillary Axillary  SpO2: 100% 100% 100% 100%    Intake/Output Summary (Last 24 hours) at 12/17/2018 1921 Last data filed at 12/17/2018 1800 Gross per 24 hour  Intake 2313.76 ml  Output 1000 ml  Net 1313.76 ml   There were no vitals filed for this visit.  Examination:  General exam: Awake, no distress Respiratory system: Clear to auscultation. Respiratory effort normal. Cardiovascular system:RRR. No murmurs, rubs, gallops. Gastrointestinal system: Abdomen is nondistended, soft and nontender. No organomegaly or masses felt. Normal bowel sounds heard. Central nervous system: Does not follow commands, falls back asleep, speech is difficult to comprehend Extremities: 1+ edema bilaterally Skin: No rashes, lesions or ulcers Psychiatry: Awake, confused.     Data Reviewed: I have personally reviewed following labs and imaging studies  CBC: Recent Labs  Lab 12/11/18 1600  12/13/18 0411 12/14/18 0416 12/15/18 0807 12/16/18 0729 12/17/18 0459  WBC 14.2*   < > 13.0* 10.9* 9.7 10.4 10.7*  NEUTROABS 10.9*  --   --   --   --   --   --   HGB 9.1*   < > 9.0* 9.2* 8.4*  7.7* 7.5*  HCT 32.3*   < > 31.7* 32.6* 29.5* 26.8* 25.8*  MCV 83.0   < > 82.8 83.6 83.6 82.7 81.9  PLT 264   < > 266 264 274 242 221   < > = values in this interval not displayed.   Basic Metabolic Panel: Recent Labs  Lab 12/13/18 0411 12/14/18 0416 12/15/18 0807 12/16/18 0729 12/17/18 0459  NA 158* 156* 150* 143 146*  K 4.0 4.2 4.0 3.7 3.6  CL 127* 126* 120* 114* 114*  CO2 22 20* 23 22 24   GLUCOSE 229* 233* 263* 233* 172*  BUN 53* 41* 26* 17 13  CREATININE 1.40* 1.29* 1.09 0.98 0.92  CALCIUM 9.7 9.5 9.1 8.9 9.0    GFR: Estimated Creatinine Clearance: 91.1 mL/min (by C-G formula based on SCr of 0.92 mg/dL). Liver Function Tests: Recent Labs  Lab 12/11/18 1600 12/12/18 0500 12/14/18 0416 12/17/18 0459  AST 34 31 21 41  ALT 20 22 18 24   ALKPHOS 53 62 59 63  BILITOT 1.0 0.9 0.5 0.9  PROT 8.0 8.3* 7.9 6.7  ALBUMIN 2.4* 2.4* 2.3* 1.9*   No results for input(s): LIPASE, AMYLASE in the last 168 hours. Recent Labs  Lab 12/12/18 1525 12/14/18 1420  AMMONIA 43* 37*   Coagulation Profile: Recent Labs  Lab 12/11/18 1600  INR 1.4*   Cardiac Enzymes: No results for input(s): CKTOTAL, CKMB, CKMBINDEX, TROPONINI in the last 168 hours. BNP (last 3 results) No results for input(s): PROBNP in the last 8760 hours. HbA1C: No results for input(s): HGBA1C in the last 72 hours. CBG: Recent Labs  Lab 12/16/18 2334 12/17/18 0408 12/17/18 0841 12/17/18 1225 12/17/18 1604  GLUCAP 158* 150* 158* 164* 169*   Lipid Profile: No results for input(s): CHOL, HDL, LDLCALC, TRIG, CHOLHDL, LDLDIRECT in the last 72 hours. Thyroid Function Tests: No results for input(s): TSH, T4TOTAL, FREET4, T3FREE, THYROIDAB in the last 72 hours. Anemia Panel: Recent Labs    12/16/18 0729 12/17/18 0459  VITAMINB12 390 360  FOLATE 4.3* 4.5*  FERRITIN 1,615* 1,506*  TIBC 183* 182*  IRON 16* 14*  RETICCTPCT 1.5 1.8   Sepsis Labs: Recent Labs  Lab 12/11/18 1700 12/11/18 1802 12/14/18 1420  LATICACIDVEN 2.2* 2.3* 1.5    Recent Results (from the past 240 hour(s))  SARS Coronavirus 2 (CEPHEID - Performed in [place] hospital lab), Hosp Order     Status: None   Collection Time: 12/11/18  5:00 PM   Specimen: Nasopharyngeal Swab  Result Value Ref Range Status   SARS Coronavirus 2 NEGATIVE NEGATIVE Final    Comment: (NOTE) If result is NEGATIVE SARS-CoV-2 target nucleic acids are NOT DETECTED. The SARS-CoV-2 RNA is generally detectable in upper and lower  respiratory specimens during the acute phase of  infection. The lowest  concentration of SARS-CoV-2 viral copies this assay can detect is 250  copies / mL. A negative result does not preclude SARS-CoV-2 infection  and should not be used as the sole basis for treatment or other  patient management decisions.  A negative result may occur with  improper specimen collection / handling, submission of specimen other  than nasopharyngeal swab, presence of viral mutation(s) within the  areas targeted by this assay, and inadequate number of viral copies  (<250 copies / mL). A negative result must be combined with clinical  observations, patient history, and epidemiological information. If result is POSITIVE SARS-CoV-2 target nucleic acids are DETECTED. The SARS-CoV-2 RNA is generally detectable in upper and  lower  respiratory specimens dur ing the acute phase of infection.  Positive  results are indicative of active infection with SARS-CoV-2.  Clinical  correlation with patient history and other diagnostic information is  necessary to determine patient infection status.  Positive results do  not rule out bacterial infection or co-infection with other viruses. If result is PRESUMPTIVE POSTIVE SARS-CoV-2 nucleic acids MAY BE PRESENT.   A presumptive positive result was obtained on the submitted specimen  and confirmed on repeat testing.  While 2019 novel coronavirus  (SARS-CoV-2) nucleic acids may be present in the submitted sample  additional confirmatory testing may be necessary for epidemiological  and / or clinical management purposes  to differentiate between  SARS-CoV-2 and other Sarbecovirus currently known to infect humans.  If clinically indicated additional testing with an alternate test  methodology [phone]) is advised. The SARS-CoV-2 RNA is generally  detectable in upper and lower respiratory sp ecimens during the acute  phase of infection. The expected result is Negative. Fact Sheet for Patients:   StrictlyIdeas.no Fact Sheet for Healthcare Providers: BankingDealers.co.za This test is not yet approved or cleared by the [place] FDA and has been authorized for detection and/or diagnosis of SARS-CoV-2 by FDA under an Emergency Use Authorization (EUA).  This EUA will remain in effect (meaning this test can be used) for the duration of the COVID-19 declaration under Section 564(b)(1) of the Act, 21 U.S.C. section 360bbb-3(b)(1), unless the authorization is terminated or revoked sooner. Performed at [facility], [place] [address], [place], [place] [address]   Culture, blood (routine x 2)     Status: None   Collection Time: 12/11/18  5:00 PM   Specimen: BLOOD  Result Value Ref Range Status   Specimen Description BLOOD RIGHT ANTECUBITAL  Final   Special Requests   Final    BOTTLES DRAWN AEROBIC AND ANAEROBIC Blood Culture adequate volume   Culture   Final    NO GROWTH 5 DAYS Performed at [facility], [place] [address], [place], [place] [address]    Report Status 12/16/2018 FINAL  Final  Culture, blood (routine x 2)     Status: None   Collection Time: 12/11/18  5:58 PM   Specimen: BLOOD RIGHT HAND  Result Value Ref Range Status   Specimen Description BLOOD RIGHT HAND  Final   Special Requests   Final    BOTTLES DRAWN AEROBIC AND ANAEROBIC Blood Culture results may not be optimal due to an inadequate volume of blood received in culture bottles   Culture   Final    NO GROWTH 5 DAYS Performed at [facility], [place] [address] [place], [place] [address]    Report Status 12/16/2018 FINAL  Final  MRSA PCR Screening     Status: None   Collection Time: 12/12/18 12:28 AM   Specimen: Nasal Mucosa; Nasopharyngeal  Result Value Ref Range Status   MRSA by PCR NEGATIVE NEGATIVE Final    Comment:        The GeneXpert MRSA Assay (FDA approved for NASAL specimens only), is one component of a comprehensive MRSA  colonization surveillance program. It is not intended to diagnose MRSA infection nor to guide or monitor treatment for MRSA infections. Performed at [facility], [place] [address], [place], [place]  [address]   Urine culture     Status: Abnormal   Collection Time: 12/12/18  5:23 AM   Specimen: Urine, Random  Result Value Ref Range Status  Specimen Description URINE, RANDOM  Final   Special Requests   Final    NONE Performed at [facility], [place] [address] [place], [place] [address]    Culture MULTIPLE SPECIES PRESENT, SUGGEST RECOLLECTION (A)  Final   Report Status 12/13/2018 FINAL  Final         Radiology Studies: [place] Ekg Site Rite  Result Date: 12/16/2018 If Site Rite image not attached, placement could not be confirmed due to current cardiac rhythm.       Scheduled Meds:  aspirin  300 mg Rectal Daily   Or   aspirin  325 mg Oral Daily   chlorhexidine  15 mL Mouth Rinse BID   dorzolamide  1 drop Both Eyes BID   enoxaparin (LOVENOX) injection  40 mg Subcutaneous Q24H   insulin aspart  0-9 Units Subcutaneous Q4H   mouth rinse  15 mL Mouth Rinse q12n4p   metoprolol tartrate  5 mg Intravenous Q6H   nicotine  21 mg Transdermal Daily   potassium chloride  10 mEq Oral Daily   Continuous Infusions:  dextrose 50 mL/hr at 12/17/18 1800   lacosamide (VIMPAT) IV 100 mg (12/17/18 1300)   levETIRAcetam Stopped (12/17/18 1318)     LOS: 5 days    Time spent: 27mins    Kathie Dike, MD [facility]   If 7PM-7AM, please contact night-coverage [email or web address]  12/17/2018, 7:21 PM

## 2018-12-18 ENCOUNTER — Inpatient Hospital Stay (HOSPITAL_COMMUNITY): Payer: Medicare Other

## 2018-12-18 LAB — CBC
HCT: 26.7 % — ABNORMAL LOW (ref 39.0–52.0)
Hemoglobin: 7.8 g/dL — ABNORMAL LOW (ref 13.0–17.0)
MCH: 23.6 pg — ABNORMAL LOW (ref 26.0–34.0)
MCHC: 29.2 g/dL — ABNORMAL LOW (ref 30.0–36.0)
MCV: 80.7 fL (ref 80.0–100.0)
Platelets: ADEQUATE 10*3/uL (ref 150–400)
RBC: 3.31 MIL/uL — ABNORMAL LOW (ref 4.22–5.81)
RDW: 16.3 % — ABNORMAL HIGH (ref 11.5–15.5)
WBC: 13.6 10*3/uL — ABNORMAL HIGH (ref 4.0–10.5)
nRBC: 0 % (ref 0.0–0.2)

## 2018-12-18 LAB — BASIC METABOLIC PANEL
Anion gap: 9 (ref 5–15)
BUN: 12 mg/dL (ref 8–23)
CO2: 21 mmol/L — ABNORMAL LOW (ref 22–32)
Calcium: 8.9 mg/dL (ref 8.9–10.3)
Chloride: 110 mmol/L (ref 98–111)
Creatinine, Ser: 0.93 mg/dL (ref 0.61–1.24)
GFR calc Af Amer: >60 mL/min (ref >60)
GFR calc non Af Amer: >60 mL/min (ref >60)
Glucose, Bld: 206 mg/dL — ABNORMAL HIGH (ref 70–99)
Potassium: 3.5 mmol/L (ref 3.5–5.1)
Sodium: 140 mmol/L (ref 135–145)

## 2018-12-18 LAB — GLUCOSE, CAPILLARY
Glucose-Capillary: 224 mg/dL — ABNORMAL HIGH (ref 70–99)
Glucose-Capillary: 220 mg/dL — ABNORMAL HIGH (ref 70–99)
Glucose-Capillary: 185 mg/dL — ABNORMAL HIGH (ref 70–99)
Glucose-Capillary: 194 mg/dL — ABNORMAL HIGH (ref 70–99)
Glucose-Capillary: 187 mg/dL — ABNORMAL HIGH (ref 70–99)
Glucose-Capillary: 186 mg/dL — ABNORMAL HIGH (ref 70–99)
Glucose-Capillary: 156 mg/dL — ABNORMAL HIGH (ref 70–99)

## 2018-12-18 NOTE — Evaluation (Signed)
Clinical/Bedside Swallow Evaluation Patient Details  Name: Julian Tyler MRN: [id number] Date of Birth: 1945/10/12  Today's Date: 12/18/2018 Time: SLP Start Time (ACUTE ONLY): 0700 SLP Stop Time (ACUTE ONLY): 0722 SLP Time Calculation (min) (ACUTE ONLY): 22 min  Past Medical History:  Past Medical History:  Diagnosis Date  . Cataract   . Chorioretinal scar of left eye   . Diabetes ([place])   . Dry eyes   . Glaucoma   . HLD (hyperlipidemia)   . HTN (hypertension)   . Stroke [facility])    Past Surgical History: No past surgical history on file. HPI:  Pt is a 73 y.o. male, w hypertension, hyperlipidemia, dm2, h/o stroke , seizure, dementia, who presented with altered mental status. Per nurse, pt had right side deficit, not able to walk by self, ? Slight slurred speech. Pt recently had seroquel , xanax, and depakote added due to combativeness. MRI of the brain was negative. Acute metabolic encephalopathy suspect to be secondary to to dehydration and hypernatremia. Urinalysis indicated possible infection; he was started on antibiotics.    Assessment / Plan / Recommendation Clinical Impression  Pt was alert with cueing this date for clinical swallow evaluation. Pt able to follow some simple commands inconsistently including volitional swallow per palpation. Oral care provided, pt noted to be edentulous, no dentures observed at bedside. Pt with prolonged oral holding, rotary mastication, without successfull initiation of swallow during PO trials. Pt expectorated ice chip from oral cavity following prolonged oral holding. SLP provided suction for removal of water and puree bolus from oral cavity following unsuccessful AP transit. A safe PO diet cannot be recommended. SLP to closley monitor for PO readiness.   SLP Visit Diagnosis: Dysphagia, oral phase (R13.11)    Aspiration Risk  Moderate aspiration risk    Diet Recommendation   NPO  Medication Administration: Via alternative means    Other   Recommendations Oral Care Recommendations: Oral care QID   Follow up Recommendations Skilled Nursing facility;24 hour supervision/assistance      Frequency and Duration min 2x/week  2 weeks       Prognosis Prognosis for Safe Diet Advancement: Fair Barriers to Reach Goals: Cognitive deficits      Swallow Study   General Date of Onset: 12/11/18 HPI: Pt is a 73 y.o. male, w hypertension, hyperlipidemia, dm2, h/o stroke , seizure, dementia, who presented with altered mental status. Per nurse, pt had right side deficit, not able to walk by self, ? Slight slurred speech. Pt recently had seroquel , xanax, and depakote added due to combativeness. MRI of the brain was negative. Acute metabolic encephalopathy suspect to be secondary to to dehydration and hypernatremia. Urinalysis indicated possible infection; he was started on antibiotics.  Type of Study: Bedside Swallow Evaluation Diet Prior to this Study: NPO Temperature Spikes Noted: Yes Respiratory Status: Nasal cannula History of Recent Intubation: No Behavior/Cognition: Requires cueing Oral Cavity Assessment: Within Functional Limits Oral Care Completed by SLP: Yes Oral Cavity - Dentition: Edentulous Vision: Impaired for self-feeding Self-Feeding Abilities: Needs assist Patient Positioning: Upright in bed Baseline Vocal Quality: Low vocal intensity Volitional Cough: Cognitively unable to elicit Volitional Swallow: Able to elicit    Oral/Motor/Sensory Function Overall Oral Motor/Sensory Function: Generalized oral weakness   Ice Chips Ice chips: Impaired Presentation: Spoon Oral Phase Impairments: Reduced lingual movement/coordination Oral Phase Functional Implications: Prolonged oral transit;Oral holding;Oral residue;Other (comment)(eventually expectoration of bolus from oral cavity) Pharyngeal Phase Impairments: Unable to trigger swallow   Thin Liquid Thin Liquid: Impaired Presentation:  Cup Oral Phase Functional Implications:  Oral holding;Prolonged oral transit;Other (comment)(following prolonged oral holding SLP utilized oral suction ) Pharyngeal  Phase Impairments: Unable to trigger swallow    Nectar Thick Nectar Thick Liquid: Not tested   Honey Thick Honey Thick Liquid: Not tested   Puree Puree: Impaired Presentation: Spoon Oral Phase Functional Implications: Oral holding;Prolonged oral transit(SLP suctioned from oral cavity following oral holding)   Solid     Solid: Not tested      Ronnica Dreese E Koy Lamp MA, CCC-SLP Acute Rehabilitation Services 12/18/2018,7:28 AM

## 2018-12-18 NOTE — Progress Notes (Signed)
PROGRESS NOTE    Julian Tyler  PXT:[id number] DOB: Aug 05, 1945 DOA: 12/11/2018 PCP: Milana Na., MD    Brief Narrative:  73 year old male with a history of previous stroke, hypertension, diabetes, seizure disorder, dementia who is a resident of a skilled nursing facility, was brought to the hospital with worsening mental status.  Found to be significantly dehydrated, hypernatremic, with acute kidney injury.  Possible urinary tract infection, started on antibiotics.  He was also recently started on Seroquel, Xanax and Depakote during his last admission for combativeness.  He has a history of seizures and is on Keppra and Vimpat.  Neurology followed him during his hospital stay. Mental status how improved, but patient is still failing swallow eval due to cognitive deficits. Palliative care following to help address goals of care.   Assessment & Plan:   Principal Problem:   Altered mental status Active Problems:   Diabetes mellitus type 2, uncontrolled (HCC)   Goals of care, counseling/discussion   Severe protein-calorie malnutrition (HCC)   Anemia   Hypernatremia   ARF (acute renal failure) (HCC)   Tachycardia   Lactic acidosis   AMS (altered mental status)   Palliative care by specialist   Metabolic encephalopathy   Vascular dementia without behavioral disturbance ([place])   1. Acute metabolic encephalopathy, superimposed on baseline dementia.  Suspect this is related to dehydration and hypernatremia. Ammonia is unremarkable.  PCO2 on blood gases also unrevealing.  MRI Imaging of the brain did not show any acute findings and initial EEG does not show any epileptiform discharges.  Neurology was following, now signed off. He is slowly improving, but still has periods of intermittent lethargy through the day. Will check repeat EEG today to evaluate for ongoing seizures. 2. Hypernatremia.  Related to decreased p.o. intake.  Continue gentle hydration with hypotonic fluids. Sodium  has normalized since admission. 3. History of seizures.  Since patient is very lethargic and not able to take medications by mouth.  Continue antiepileptics with Keppra, Vimpat via IV route.  4. Acute kidney injury.  Creatinine was normal when discharged from [facility] last month.  Decline in renal function related to dehydration. Overall creatinine has improved with hydration. 5. Diabetes.  Metformin on hold due to renal failure.  Continue on sliding scale insulin.  Blood sugar stable. 6. Hypertension.  Oral metoprolol on hold since he is unable to take by mouth.  Continue on IV Lopressor. 7. Dementia, likely vascular, supportive care 8. Severe protein calorie malnurtition/Failure to thrive. Patient failed swallow evaluation. Palliative care following to address feeding tube placement 9. Abnormal Echo. Echo reports possible mobile density on aortic valve. Blood cultures have shown no growth and he has been afebrile. Can consider TEE for further characterization if his condition starts to improve 10. Anemia.  Likely related to chronic disease.  No signs of bleeding at this time.  Monitor hemoglobin and if continues to decline, consider transfusion. 11. Goals of care.  Patient appears to have had a decline over the past 1 to 2 months since his admission to [facility].  He has had failure to thrive and decreased p.o. intake resulting in severe dehydration/hypernatremia and protein calorie malnutrition.  Currently, his son wishes for patient's CODE STATUS to be full code. Palliative care is assisting in goals of care discussion. Meeting was had on 7/4 with several of patient's children, his wife and other family members over conference phone line. We discussed code status, advance directives including feeding tubes and what his  wishes would be around end of life. Since patient is failing swallow test, will need to discuss Cortrak/PEG placement. Broadus John wishes to discuss with family.  Will ask palliative to further these discussions.   DVT prophylaxis: Lovenox Code Status: Full code Family Communication: Discussed with son [name] 7/7 Disposition Plan: Return to skilled nursing facility on discharge   Consultants:   Neurology  Palliative care  Procedures:   EEG: This is an abnormal EEG secondary to general background slowing.  This finding may be seen with a diffuse disturbance that is etiologically nonspecific, but may include a metabolic encephalopathy, among other possibilities.  Despite the patient having multiple episodes with upper extremity shaking and moaning, no epileptiform activity is noted.   Echo: 1. The left ventricle has normal systolic function with an ejection fraction of 60-65%. The cavity size was normal. There is mildly increased left ventricular wall thickness. Left ventricular diastolic Doppler parameters are consistent with impaired  relaxation. No evidence of left ventricular regional wall motion abnormalities.  2. The right ventricle has normal systolic function. The cavity was normal. There is no increase in right ventricular wall thickness.  3. Trivial pericardial effusion is present.  4. Mild calcification of the mitral valve leaflet. There is mild mitral annular calcification present. No evidence of mitral valve stenosis. No significant mitral regurgitation.  5. The aortic valve is tricuspid. Moderate calcification of the aortic valve. No stenosis of the aortic valve. In the parasternal long axis view, there appears to be a small mobile mass on the atrial size of the aortic valve, possible vegetation. Poor  images on this study, cannot see aortic valve well in other views. Would suggest TEE to evaluate more closely.  6. The aortic root is normal in size and structure.  7. IVC was normal in size. PA systolic pressure 35 mmHg.   8. Technically difficult study with poor acoustic windows.  Antimicrobials:   Ceftriaxone 7/1  >7/4   Subjective: Opens eyes to voice, more awake than yesterday, speech is difficult to comprehend.  Objective: Vitals:   12/18/18 1107 12/18/18 1204 12/18/18 1650 12/18/18 1924  BP: (!) 151/73 (!) 160/70 (!) 152/69 (!) 165/78  Pulse: (!) 104 (!) 105 (!) 101 96  Resp: 17 20 19 18   Temp:  99.4 F (37.4 C)  98.5 F (36.9 C)  TempSrc:  Axillary  Oral  SpO2: 100% 100% 98% 100%    Intake/Output Summary (Last 24 hours) at 12/18/2018 2210 Last data filed at 12/18/2018 2000 Gross per 24 hour  Intake 1677.4 ml  Output 750 ml  Net 927.4 ml   There were no vitals filed for this visit.  Examination:  General exam: awake, no distress Respiratory system: crackles at bases. Respiratory effort normal. Cardiovascular system:RRR. No murmurs, rubs, gallops. Gastrointestinal system: Abdomen is nondistended, soft and nontender. No organomegaly or masses felt. Normal bowel sounds heard. Central nervous system: speech dysarthric, does not follow commands Extremities: 1+ edema bilaterally Skin: No rashes, lesions or ulcers Psychiatry: awake, more interactive, still confused     Data Reviewed: I have personally reviewed following labs and imaging studies  CBC: Recent Labs  Lab 12/14/18 0416 12/15/18 0807 12/16/18 0729 12/17/18 0459 12/18/18 0747  WBC 10.9* 9.7 10.4 10.7* 13.6*  HGB 9.2* 8.4* 7.7* 7.5* 7.8*  HCT 32.6* 29.5* 26.8* 25.8* 26.7*  MCV 83.6 83.6 82.7 81.9 80.7  PLT 264 274 242 221 PLATELET CLUMPS NOTED ON SMEAR, COUNT APPEARS ADEQUATE   Basic Metabolic Panel: Recent Labs  Lab 12/14/18 0416 12/15/18 0807 12/16/18 0729 12/17/18 0459 12/18/18 0936  NA 156* 150* 143 146* 140  K 4.2 4.0 3.7 3.6 3.5  CL 126* 120* 114* 114* 110  CO2 20* 23 22 24  21*  GLUCOSE 233* 263* 233* 172* 206*  BUN 41* 26* 17 13 12   CREATININE 1.29* 1.09 0.98 0.92 0.93  CALCIUM 9.5 9.1 8.9 9.0 8.9   GFR: Estimated Creatinine Clearance: 90.1 mL/min (by C-G formula based on SCr of 0.93  mg/dL). Liver Function Tests: Recent Labs  Lab 12/12/18 0500 12/14/18 0416 12/17/18 0459  AST 31 21 41  ALT 22 18 24   ALKPHOS 62 59 63  BILITOT 0.9 0.5 0.9  PROT 8.3* 7.9 6.7  ALBUMIN 2.4* 2.3* 1.9*   No results for input(s): LIPASE, AMYLASE in the last 168 hours. Recent Labs  Lab 12/12/18 1525 12/14/18 1420  AMMONIA 43* 37*   Coagulation Profile: No results for input(s): INR, PROTIME in the last 168 hours. Cardiac Enzymes: No results for input(s): CKTOTAL, CKMB, CKMBINDEX, TROPONINI in the last 168 hours. BNP (last 3 results) No results for input(s): PROBNP in the last 8760 hours. HbA1C: No results for input(s): HGBA1C in the last 72 hours. CBG: Recent Labs  Lab 12/18/18 0804 12/18/18 1102 12/18/18 1206 12/18/18 1644 12/18/18 1922  GLUCAP 220* 185* 194* 187* 186*   Lipid Profile: No results for input(s): CHOL, HDL, LDLCALC, TRIG, CHOLHDL, LDLDIRECT in the last 72 hours. Thyroid Function Tests: No results for input(s): TSH, T4TOTAL, FREET4, T3FREE, THYROIDAB in the last 72 hours. Anemia Panel: Recent Labs    12/16/18 0729 12/17/18 0459  VITAMINB12 390 360  FOLATE 4.3* 4.5*  FERRITIN 1,615* 1,506*  TIBC 183* 182*  IRON 16* 14*  RETICCTPCT 1.5 1.8   Sepsis Labs: Recent Labs  Lab 12/14/18 1420  LATICACIDVEN 1.5    Recent Results (from the past 240 hour(s))  SARS Coronavirus 2 (CEPHEID - Performed in [place] hospital lab), Hosp Order     Status: None   Collection Time: 12/11/18  5:00 PM   Specimen: Nasopharyngeal Swab  Result Value Ref Range Status   SARS Coronavirus 2 NEGATIVE NEGATIVE Final    Comment: (NOTE) If result is NEGATIVE SARS-CoV-2 target nucleic acids are NOT DETECTED. The SARS-CoV-2 RNA is generally detectable in upper and lower  respiratory specimens during the acute phase of infection. The lowest  concentration of SARS-CoV-2 viral copies this assay can detect is 250  copies / mL. A negative result does not preclude SARS-CoV-2  infection  and should not be used as the sole basis for treatment or other  patient management decisions.  A negative result may occur with  improper specimen collection / handling, submission of specimen other  than nasopharyngeal swab, presence of viral mutation(s) within the  areas targeted by this assay, and inadequate number of viral copies  (<250 copies / mL). A negative result must be combined with clinical  observations, patient history, and epidemiological information. If result is POSITIVE SARS-CoV-2 target nucleic acids are DETECTED. The SARS-CoV-2 RNA is generally detectable in upper and lower  respiratory specimens dur ing the acute phase of infection.  Positive  results are indicative of active infection with SARS-CoV-2.  Clinical  correlation with patient history and other diagnostic information is  necessary to determine patient infection status.  Positive results do  not rule out bacterial infection or co-infection with other viruses. If result is PRESUMPTIVE POSTIVE SARS-CoV-2 nucleic acids MAY BE PRESENT.   A presumptive  positive result was obtained on the submitted specimen  and confirmed on repeat testing.  While 2019 novel coronavirus  (SARS-CoV-2) nucleic acids may be present in the submitted sample  additional confirmatory testing may be necessary for epidemiological  and / or clinical management purposes  to differentiate between  SARS-CoV-2 and other Sarbecovirus currently known to infect humans.  If clinically indicated additional testing with an alternate test  methodology [phone]) is advised. The SARS-CoV-2 RNA is generally  detectable in upper and lower respiratory sp ecimens during the acute  phase of infection. The expected result is Negative. Fact Sheet for Patients:  StrictlyIdeas.no Fact Sheet for Healthcare Providers: BankingDealers.co.za This test is not yet approved or cleared by the [place]  FDA and has been authorized for detection and/or diagnosis of SARS-CoV-2 by FDA under an Emergency Use Authorization (EUA).  This EUA will remain in effect (meaning this test can be used) for the duration of the COVID-19 declaration under Section 564(b)(1) of the Act, 21 U.S.C. section 360bbb-3(b)(1), unless the authorization is terminated or revoked sooner. Performed at [facility], [place] [address] [place], [place] [address]   Culture, blood (routine x 2)     Status: None   Collection Time: 12/11/18  5:00 PM   Specimen: BLOOD  Result Value Ref Range Status   Specimen Description BLOOD RIGHT ANTECUBITAL  Final   Special Requests   Final    BOTTLES DRAWN AEROBIC AND ANAEROBIC Blood Culture adequate volume   Culture   Final    NO GROWTH 5 DAYS Performed at [facility], [place] [address], [place], [place] [address]    Report Status 12/16/2018 FINAL  Final  Culture, blood (routine x 2)     Status: None   Collection Time: 12/11/18  5:58 PM   Specimen: BLOOD RIGHT HAND  Result Value Ref Range Status   Specimen Description BLOOD RIGHT HAND  Final   Special Requests   Final    BOTTLES DRAWN AEROBIC AND ANAEROBIC Blood Culture results may not be optimal due to an inadequate volume of blood received in culture bottles   Culture   Final    NO GROWTH 5 DAYS Performed at [facility], [place] [address], [place], [place] [address]    Report Status 12/16/2018 FINAL  Final  MRSA PCR Screening     Status: None   Collection Time: 12/12/18 12:28 AM   Specimen: Nasal Mucosa; Nasopharyngeal  Result Value Ref Range Status   MRSA by PCR NEGATIVE NEGATIVE Final    Comment:        The GeneXpert MRSA Assay (FDA approved for NASAL specimens only), is one component of a comprehensive MRSA colonization surveillance program. It is not intended to diagnose MRSA infection nor to guide or monitor treatment for MRSA infections. Performed at [facility], [place] [address]  [place], [place] [address]   Urine culture     Status: Abnormal   Collection Time: 12/12/18  5:23 AM   Specimen: Urine, Random  Result Value Ref Range Status   Specimen Description URINE, RANDOM  Final   Special Requests   Final    NONE Performed at [facility], [place] [address], [place],  [address]    Culture MULTIPLE SPECIES PRESENT, SUGGEST RECOLLECTION (A)  Final   Report Status 12/13/2018 FINAL  Final         Radiology Studies: No results found.      Scheduled Meds:  aspirin  300 mg Rectal Daily   Or   aspirin  325 mg Oral Daily   chlorhexidine  15 mL Mouth Rinse BID   dorzolamide  1 drop Both Eyes BID   enoxaparin (LOVENOX) injection  40 mg Subcutaneous Q24H   insulin aspart  0-9 Units Subcutaneous Q4H   mouth rinse  15 mL Mouth Rinse q12n4p   metoprolol tartrate  5 mg Intravenous Q6H   nicotine  21 mg Transdermal Daily   potassium chloride  10 mEq Oral Daily   Continuous Infusions:  dextrose 50 mL/hr at 12/18/18 2000   lacosamide (VIMPAT) IV 100 mg (12/18/18 0958)   levETIRAcetam 1,000 mg (12/18/18 1228)     LOS: 6 days    Time spent: 39mins    Kathie Dike, MD [facility]   If 7PM-7AM, please contact night-coverage [email or web address]  12/18/2018, 10:10 PM

## 2018-12-18 NOTE — Procedures (Signed)
History: 73 year old male being evaluated for encephalopathy  Sedation: None  Technique: This is a 21 channel routine scalp EEG performed at the bedside with bipolar and monopolar montages arranged in accordance to the international 10/20 system of electrode placement. One channel was dedicated to EKG recording.    Background: There is a posterior dominant rhythm of 7-8Hz , but this is relatively poorly sustained.  In addition there is focal irregular delta activity with phase reversal at T3 that is present throughout the study.  There is also mild intrusion of the background of a regular delta activity.  Photic stimulation: Physiologic driving is not performed  EEG Abnormalities: 1) focal left temporal delta activity 2) generalized irregular slow activity 3) slow posterior dominant rhythm  Clinical Interpretation: This EEG reveals a focal area of abnormality in the left temporal region.  This is relatively nonspecific, can be seen in multiple conditions including mass lesions, stroke, neurodegenerative conditions, postictal states, among other causes.  There is also evidence of a nonspecific generalized cerebral dysfunction (encephalopathy).  There was no seizure or evidence of a seizure predisposition seen on the study.  Roland Rack, MD [facility] [phone]  If 7pm- 7am, please page neurology on call as listed in [place].

## 2018-12-18 NOTE — Progress Notes (Signed)
EEG completed, results pending. 

## 2018-12-18 NOTE — Progress Notes (Signed)
Physical Therapy Treatment Patient Details Name: Julian Tyler MRN: [id number] DOB: 1946/01/18 Today's Date: 12/18/2018    History of Present Illness Pt is a 73 y/o nursing home resident who presents with AMS. MRI negative, and acute metabolic encephalopathy suspected to be 2 dehydration and hypernatremia. Possible UTI as well. PMH: stroke, CKD, Dry eyes, DM, hyperlipidemia    PT Comments    Pt not able to be aroused enough to participate with a mobility assessment this session. Pt opened R eye x2 with max cues. Pt with withdraw from noxious stimuli in R and L foot, however no withdraw from noxious stimuli in bilateral hands. Pt was repositioned in chair position, tilted to the R and with BUE's supported to encourage wakefulness and decrease pressure on sacrum. Per RN, pt was awake all night and has been sleeping today, however would not rouse to sternal rub. Will continue efforts to engage pt in therapeutic activity.    Follow Up Recommendations  SNF     Equipment Recommendations  None recommended by PT    Recommendations for Other Services Rehab consult     Precautions / Restrictions Precautions Precautions: None Restrictions Weight Bearing Restrictions: No    Mobility  Bed Mobility               General bed mobility comments: Pt unable to arouse enough to participate  Transfers                    Ambulation/Gait                 Stairs             Wheelchair Mobility    Modified Rankin (Stroke Patients Only)       Balance Overall balance assessment: Needs assistance Sitting-balance support: No upper extremity supported;Feet supported Sitting balance-Leahy Scale: Poor Sitting balance - Comments: posterior lean                                    Cognition Arousal/Alertness: Lethargic Behavior During Therapy: Flat affect Overall Cognitive Status: Difficult to assess                                  General Comments: Patient is minimally responsive      Exercises      General Comments        Pertinent Vitals/Pain Pain Assessment: Faces Faces Pain Scale: No hurt    Home Living Family/patient expects to be discharged to:: Skilled nursing facility   Available Help at Discharge: Family                Prior Function Level of Independence: Needs assistance      Comments: unknown prior level of function. Patient with wound guards on B/L heels   PT Goals (current goals can now be found in the care plan section) Acute Rehab PT Goals Patient Stated Goal: unable to state goals  PT Goal Formulation: With patient Time For Goal Achievement: 12/21/18 Potential to Achieve Goals: Fair Progress towards PT goals: Not progressing toward goals - comment    Frequency    Min 2X/week      PT Plan Current plan remains appropriate    Co-evaluation              AM-PAC PT "6 Clicks" Mobility   Outcome  Measure  Help needed turning from your back to your side while in a flat bed without using bedrails?: Total Help needed moving from lying on your back to sitting on the side of a flat bed without using bedrails?: Total Help needed moving to and from a bed to a chair (including a wheelchair)?: Total Help needed standing up from a chair using your arms (e.g., wheelchair or bedside chair)?: Total Help needed to walk in hospital room?: Total Help needed climbing 3-5 steps with a railing? : Total 6 Click Score: 6    End of Session Equipment Utilized During Treatment: Gait belt Activity Tolerance: Patient limited by lethargy Patient left: in bed;with call bell/phone within reach;with bed alarm set Nurse Communication: Mobility status PT Visit Diagnosis: Other abnormalities of gait and mobility (R26.89);Muscle weakness (generalized) (M62.81)     Time: 8182-9937 PT Time Calculation (min) (ACUTE ONLY): 18 min  Charges:  $Therapeutic Activity: 8-22 mins                      Rolinda Roan, PT, DPT Acute Rehabilitation Services Pager: [phone] Office: [phone]    Thelma Comp 12/18/2018, 2:01 PM

## 2018-12-18 NOTE — Progress Notes (Signed)
PT at bedside with pt  RN came to room  Pt very lethargic, not responding to painful stimuli or sternal rub Vital signs documented, CBG checked 185 Paged MD  MD Memon aware of pt change in status, stated he will place order for EEG  Will continue to monitor

## 2018-12-18 NOTE — Progress Notes (Signed)
Pt son, Broadus John, called. Update provided and pt son able to talk to pt  Pt son requesting MD update as well, MD aware

## 2018-12-19 DIAGNOSIS — E119 Type 2 diabetes mellitus without complications: Secondary | ICD-10-CM

## 2018-12-19 DIAGNOSIS — R569 Unspecified convulsions: Secondary | ICD-10-CM

## 2018-12-19 DIAGNOSIS — F0150 Vascular dementia without behavioral disturbance: Secondary | ICD-10-CM

## 2018-12-19 LAB — GLUCOSE, CAPILLARY
Glucose-Capillary: 162 mg/dL — ABNORMAL HIGH (ref 70–99)
Glucose-Capillary: 166 mg/dL — ABNORMAL HIGH (ref 70–99)
Glucose-Capillary: 168 mg/dL — ABNORMAL HIGH (ref 70–99)
Glucose-Capillary: 170 mg/dL — ABNORMAL HIGH (ref 70–99)
Glucose-Capillary: 191 mg/dL — ABNORMAL HIGH (ref 70–99)
Glucose-Capillary: 196 mg/dL — ABNORMAL HIGH (ref 70–99)

## 2018-12-19 LAB — CBC
HCT: 24.3 % — ABNORMAL LOW (ref 39.0–52.0)
Hemoglobin: 7.1 g/dL — ABNORMAL LOW (ref 13.0–17.0)
MCH: 23.2 pg — ABNORMAL LOW (ref 26.0–34.0)
MCHC: 29.2 g/dL — ABNORMAL LOW (ref 30.0–36.0)
MCV: 79.4 fL — ABNORMAL LOW (ref 80.0–100.0)
Platelets: 234 10*3/uL (ref 150–400)
RBC: 3.06 MIL/uL — ABNORMAL LOW (ref 4.22–5.81)
RDW: 15.4 % (ref 11.5–15.5)
WBC: 11.9 10*3/uL — ABNORMAL HIGH (ref 4.0–10.5)
nRBC: 0 % (ref 0.0–0.2)

## 2018-12-19 LAB — COMPREHENSIVE METABOLIC PANEL
ALT: 31 U/L (ref 0–44)
AST: 39 U/L (ref 15–41)
Albumin: 1.8 g/dL — ABNORMAL LOW (ref 3.5–5.0)
Alkaline Phosphatase: 57 U/L (ref 38–126)
Anion gap: 8 (ref 5–15)
BUN: 11 mg/dL (ref 8–23)
CO2: 22 mmol/L (ref 22–32)
Calcium: 8.6 mg/dL — ABNORMAL LOW (ref 8.9–10.3)
Chloride: 109 mmol/L (ref 98–111)
Creatinine, Ser: 0.92 mg/dL (ref 0.61–1.24)
GFR calc Af Amer: >60 mL/min (ref >60)
GFR calc non Af Amer: >60 mL/min (ref >60)
Glucose, Bld: 178 mg/dL — ABNORMAL HIGH (ref 70–99)
Potassium: 3.5 mmol/L (ref 3.5–5.1)
Sodium: 139 mmol/L (ref 135–145)
Total Bilirubin: 0.5 mg/dL (ref 0.3–1.2)
Total Protein: 6.3 g/dL — ABNORMAL LOW (ref 6.5–8.1)

## 2018-12-19 NOTE — Progress Notes (Signed)
Multiple attempts over past 24 hours to connect with family for continued discussion regarding Code status and Coretrak/PEG. Attempted to contact son again and wife stated he was still working and she had not spoken with him regarding my previous messages.   I will be off service after 5pm today, but will discuss with team for provider follow up and continued attempts to connect with family over the next few days.  Alda Lea, AGPCNP-BC Palliative Medicine Team  Phone: [phone] Amion: N. [place]   NO CHARGE

## 2018-12-19 NOTE — Progress Notes (Signed)
Palliative Note:  Message left with patient's daughter-in-law for Julian Tyler to give me a return called for continued [place] discussion. She stated he was at work and she would talk with him later today on his lunch break and give him the message. No other available number to reach him at.   Will await sons return call for further [place] regarding Code status and further care (Coretrak/PEG). Contact information given to wife.   Alda Lea, AGPCNP-BC Palliative Medicine Team

## 2018-12-19 NOTE — Progress Notes (Signed)
Initial Nutrition Assessment   RD working remotely.  DOCUMENTATION CODES:   Obesity unspecified  INTERVENTION:  Pending goals of care,  Recommend Jevity 1.5 @ 20 ml/hr and increase by 10 ml every 4 hours to goal rate of 50 ml/hr.   30 ml Prostat once daily.    Tube feeding regimen provides 1900 kcal (100% of needs), 90 grams of protein, and 912 ml of H2O.   NUTRITION DIAGNOSIS:   Inadequate oral intake related to inability to eat as evidenced by NPO status.  GOAL:   Patient will meet greater than or equal to 90% of their needs  MONITOR:   Skin, Weight trends, Labs, I & O's  REASON FOR ASSESSMENT:   NPO/Clear Liquid Diet, Low Braden    ASSESSMENT:   73 year old male with a history of previous stroke, hypertension, diabetes, seizure disorder, dementia who is a resident of a skilled nursing facility, was brought to the hospital with worsening mental status.  Found to be significantly dehydrated, hypernatremic, with acute kidney injury.  Pt with acute metabolic encephalopathy superimposed on baseline dementia likely related to dehydration. Pt with intermittent lethargy. RD unable to obtain pt nutrition history. Pt failed swallow evaluation due to cognitive deficits. Pt NPO over the past 5 days. Palliative care to discuss goals of care with family as well as potential Cortrak/PEG placement. If family agreeable to feeding tube, recommend initiation of enteral nutrition. Tube feeding recommendations stated above.  Unable to complete Nutrition-Focused physical exam at this time.   Labs and medications reviewed.   Diet Order:   Diet Order            Diet NPO time specified  Diet effective now              EDUCATION NEEDS:   Not appropriate for education at this time  Skin:  Skin Assessment: Reviewed RN Assessment  Last BM:  7/8  Height:   Ht Readings from Last 1 Encounters:  12/11/18 5\' 8"  (1.727 m)    Weight:   Wt Readings from Last 1 Encounters:   12/11/18 119.1 kg    Ideal Body Weight:  70 kg  BMI:  39.96 kg/m^2  Estimated Nutritional Needs:   Kcal:  1900-2100  Protein:  90-110 grams  Fluid:  1.9 - 2.1 L/day    Corrin Parker, MS, RD, LDN Pager # [phone] After hours/ weekend pager # [phone]

## 2018-12-19 NOTE — Progress Notes (Signed)
  Speech Language Pathology Treatment: Dysphagia  Patient Details Name: Julian Tyler MRN: [id number] DOB: 1946/02/10 Today's Date: 12/19/2018 Time: 0092-3300 SLP Time Calculation (min) (ACUTE ONLY): 12 min  Assessment / Plan / Recommendation Clinical Impression  Pt pursed his lips today, resistant to all attempts at bolus delivery with thin liquids despite cues and hand-over-hand assist attempted for self-feeding. When given a binary choice between water and applesauce pt selected applesauce, but despite accepting this orally, he had prolonged oral manipulation without swallow initiation until he began to fall asleep with bolus still in his mouth. SLP orally suctioned all of the applesauce back out of his mouth. Recommend that he remain NPO for now.   HPI HPI: Pt is a 73 y.o. male, w hypertension, hyperlipidemia, dm2, h/o stroke , seizure, dementia, who presented with altered mental status. Per nurse, pt had right side deficit, not able to walk by self, ? Slight slurred speech. Pt recently had seroquel , xanax, and depakote added due to combativeness. MRI of the brain was negative. Acute metabolic encephalopathy suspect to be secondary to to dehydration and hypernatremia. Urinalysis indicated possible infection; he was started on antibiotics.       SLP Plan  Continue with current plan of care       Recommendations  Diet recommendations: NPO                Oral Care Recommendations: Oral care QID Follow up Recommendations: Skilled Nursing facility;24 hour supervision/assistance SLP Visit Diagnosis: Dysphagia, oral phase (R13.11) Plan: Continue with current plan of care       GO                Julian Tyler Julian Tyler 12/19/2018, 11:22 AM  Julian Tyler, M.A. [place] Acute Environmental education officer [phone] Office [phone]

## 2018-12-19 NOTE — Progress Notes (Signed)
Pt not responding to stern rob. O2 = 99%, HR == 99, BG = 191, notified Physician on call and rapid response

## 2018-12-19 NOTE — Progress Notes (Signed)
Palliative medicine progress note  Patient assessed and chart reviewed. Patient is more awake and alert today. Opens eyes to voice. When asked to squeeze hand able to make attempts with right hand. Attempting to speak however difficult to understand due to slurring.   He continues to fail all attempts by SLP to assess readiness for po intake. Per notes prolonged oral manipulation without swallowing, require suctioning to clear applesauce.   Patient has now been NPO for 6 days. Attempts made to contact family and continue further discussion regarding [place] with specific focus on Code status and Coretrak/PEG placement for nutritional support. [name] is the designated family contact. Message left with his wife and all contact information left.   Patient Profile: 73 y.o. male  with past medical history of dementia, seizure disorder, hypertension, hyperlipidemia, diabetes type 2, history of CVA admitted on 12/11/2018 with failure to thrive picture, and altered mental status, hyper natremia.  Code stroke was called on patient as he had significant facial droop.  MRI of the brain performed on 12/11/2018 showed no acute processes, no CVA; moderate to severe atrophy as well as microvascular changes.  Other imaging reveals advanced intracranial arterial occlusions without eminent large vessel occlusions been present.  Plan  Full Code  Continue with current plan of care  Multiple attempts to contact family with goal of continued discussions regarding GOC (code status and Coretrak/PEG).   PMT will continue to support.   Prognosis Undetermined.  Patient's encephalopathy is improving slightly but he is not able to take anything by mouth yet.  At this point, patient would qualify for his hospice benefit, with likely a prognosis of 6 months or less if things were to continue to go the way they are now.  If family elects a more comfort, symptom based approach, and he is not able to eat, he would qualify for  residential hospice  Disposition To be determined  Total Time: 20 min.   Greater than 50%  of this time was spent counseling and coordinating care related to the above assessment and plan.  Alda Lea, AGPCNP-BC Palliative Medicine Team  Phone: [phone] Pager: [phone] Amion: Bjorn Pippin

## 2018-12-19 NOTE — Progress Notes (Signed)
PROGRESS NOTE  Julian Tyler ZOX:[id number] DOB: June 23, 1945 DOA: 12/11/2018 PCP: Milana Na., MD  Brief History   73 year old man with complicated past medical history resident of skilled nursing facility presented 6/30 with worsening mental status.  Admitted for acute kidney injury, UTI.  Also noted recently started on Seroquel, Xanax and Depakote for previous admission with competitiveness.  Also known history of seizures on Keppra and Vimpat.  Mental status improved but patient failing swallow evaluation secondary to cognitive deficits.  Palliative care following.  A & P  Acute metabolic encephalopathy superimposed on baseline dementia thought secondary to dehydration and hypernatremia.  MRI of the brain without acute findings and EEG without epileptiform discharges. --Repeat EEG unremarkable.  MRI brain on admission was negative as were CT head, neck and CT cerebral perfusion. --Patient awake and alert, affect is odd, he follows some simple commands, and responds appropriately but does not open his eyes or fully participate in the examination  Seizure disorder --No definite seizures.  EEG unrevealing. --Continue Keppra, Vimpat  Diabetes mellitus type 2 --Stable.  Continue sliding scale insulin.  Essential hypertension --Continue IV Lopressor  Vascular dementia --Continue supportive care  Severe protein calorie malnutrition, failure to thrive, failed swallow evaluation --Palliative care following and assisting with family discussions over feeding tube placement  Borderline microcytic anemia, acute on chronic, somewhat lower today although has been between 7-8 for the last 4 days. --No evidence of acute bleeding.  Check CBC in a.m.  Abnormal echocardiogram with possible mobile density on aortic valve.  Blood cultures no growth --Transesophageal echocardiogram deferred up to this point given the patient's poor clinical status  Obesity unspecified   Difficult  situation, palliative medicine involved, attending physician previously has attempted to discuss with family.  Now family member difficult to contact and not returning calls.  Patient without nutrition for 6 days.  Need to make decision on whether tube feeds will be pursued or more conservative management and hospice care will be pursued.   DVT prophylaxis: enoxaparin Code Status: Full Family Communication: none Disposition Plan: uncertain    Murray Hodgkins, MD  [facility] Direct contact: see [email or web address] (further directions at bottom of note if needed) 7PM-7AM contact night coverage as at bottom of note 12/19/2018, 6:56 PM  LOS: 7 days   Consultants  . Neurology . Palliative medicine  Procedures  .   Antibiotics  .   Interval History/Subjective  Feels okay, denies complaints.  Objective   Vitals:  Vitals:   12/19/18 1141 12/19/18 1529  BP: (!) 185/71 (!) 153/77  Pulse: (!) 118 (!) 107  Resp: 20 20  Temp: (!) 102.2 F (39 C) 99.7 F (37.6 C)  SpO2: 100% 100%    Exam:  Constitutional:  . Appears calm and comfortable lying in bed, keeps eyes closed but does respond to voice. ENMT:  . grossly normal hearing  . Lips appear normal Respiratory:  . CTA bilaterally, no w/r/r.  . Respiratory effort normal. Cardiovascular:  . RRR, no m/r/g . No LE extremity edema   Abdomen:  . Soft, nontender, nondistended . Condom catheter in place Musculoskeletal:  . Moves left arm to command Psychiatric:  . Mental status . Difficult to judge mood and affect.  Able to respond to voice, follows some simple commands, but does not participate fully.  Does not open eyes to command.  Does state his name when asked.  I have personally reviewed the following:   Today's Data  . Episode of fever today . CBG  stable . Complete metabolic panel unremarkable . Hemoglobin 7.1  Lab Data  .   Micro Data  . Blood cultures pending  Imaging  .   Cardiology Data  .   Other  Data  .   Scheduled Meds: . aspirin  300 mg Rectal Daily   Or  . aspirin  325 mg Oral Daily  . chlorhexidine  15 mL Mouth Rinse BID  . dorzolamide  1 drop Both Eyes BID  . enoxaparin (LOVENOX) injection  40 mg Subcutaneous Q24H  . insulin aspart  0-9 Units Subcutaneous Q4H  . mouth rinse  15 mL Mouth Rinse q12n4p  . metoprolol tartrate  5 mg Intravenous Q6H  . nicotine  21 mg Transdermal Daily  . potassium chloride  10 mEq Oral Daily   Continuous Infusions: . dextrose 50 mL/hr at 12/19/18 0939  . lacosamide (VIMPAT) IV 100 mg (12/19/18 1026)  . levETIRAcetam 1,000 mg (12/19/18 1207)    Principal Problem:   Metabolic encephalopathy Active Problems:   Diabetes mellitus type 2, uncontrolled (HCC)   Goals of care, counseling/discussion   Severe protein-calorie malnutrition ([place])   Anemia   Palliative care by specialist   Vascular dementia without behavioral disturbance ([place])   Seizure ([place])   LOS: 7 days   How to contact the [employer] Attending or Consulting provider [place] or covering provider during after hours [place], for this patient?  1. Check the care team in [facility] and look for a) attending/consulting TR[facility] provider listed and b) the [facility] team listed 2. Log into [email or web address] and use [place]'s universal password to access. If you do not have the password, please contact the hospital operator. 3. Locate the [facility] provider you are looking for under [facility] and page to a number that you can be directly reached. 4. If you still have difficulty reaching the provider, please page the [facility] (Director on Call) for the Hospitalists listed on amion for assistance.

## 2018-12-20 DIAGNOSIS — G9341 Metabolic encephalopathy: Secondary | ICD-10-CM

## 2018-12-20 LAB — PROTEIN ELECTROPHORESIS, SERUM
A/G Ratio: 0.5 — ABNORMAL LOW (ref 0.7–1.7)
Albumin ELP: 2.8 g/dL — ABNORMAL LOW (ref 2.9–4.4)
Alpha-1-Globulin: 0.5 g/dL — ABNORMAL HIGH (ref 0.0–0.4)
Alpha-2-Globulin: 1.2 g/dL — ABNORMAL HIGH (ref 0.4–1.0)
Beta Globulin: 1.7 g/dL — ABNORMAL HIGH (ref 0.7–1.3)
Gamma Globulin: 2 g/dL — ABNORMAL HIGH (ref 0.4–1.8)
Globulin, Total: 5.4 g/dL — ABNORMAL HIGH (ref 2.2–3.9)
Total Protein ELP: 8.2 g/dL (ref 6.0–8.5)

## 2018-12-20 LAB — CBC
HCT: 25.7 % — ABNORMAL LOW (ref 39.0–52.0)
Hemoglobin: 7.5 g/dL — ABNORMAL LOW (ref 13.0–17.0)
MCH: 23.1 pg — ABNORMAL LOW (ref 26.0–34.0)
MCHC: 29.2 g/dL — ABNORMAL LOW (ref 30.0–36.0)
MCV: 79.3 fL — ABNORMAL LOW (ref 80.0–100.0)
Platelets: 250 10*3/uL (ref 150–400)
RBC: 3.24 MIL/uL — ABNORMAL LOW (ref 4.22–5.81)
RDW: 15.2 % (ref 11.5–15.5)
WBC: 10.7 10*3/uL — ABNORMAL HIGH (ref 4.0–10.5)
nRBC: 0.2 % (ref 0.0–0.2)

## 2018-12-20 LAB — GLUCOSE, CAPILLARY
Glucose-Capillary: 139 mg/dL — ABNORMAL HIGH (ref 70–99)
Glucose-Capillary: 156 mg/dL — ABNORMAL HIGH (ref 70–99)
Glucose-Capillary: 161 mg/dL — ABNORMAL HIGH (ref 70–99)
Glucose-Capillary: 162 mg/dL — ABNORMAL HIGH (ref 70–99)
Glucose-Capillary: 170 mg/dL — ABNORMAL HIGH (ref 70–99)
Glucose-Capillary: 171 mg/dL — ABNORMAL HIGH (ref 70–99)

## 2018-12-20 MED ORDER — KCL IN DEXTROSE-NACL 20-5-0.45 MEQ/L-%-% IV SOLN
INTRAVENOUS | Status: DC
  Administered 2018-12-20 – 2018-12-22 (×4): via INTRAVENOUS
  Filled 2018-12-20 (×6): qty 1000

## 2018-12-20 NOTE — Progress Notes (Signed)
SLP Cancellation Note  Patient Details Name: Julian Tyler MRN: [id number] DOB: Feb 16, 1946   Cancelled treatment:       Reason Eval/Treat Not Completed: Fatigue/lethargy limiting ability to participate; Despite multimodal cues, pt unable to arouse for PO trials. Will continue to monitor for PO readiness   [place], JAARS 12/20/2018, 8:52 AM

## 2018-12-20 NOTE — Progress Notes (Signed)
Occupational Therapy Treatment Patient Details Name: Julian Tyler MRN: [id number] DOB: July 23, 1945 Today's Date: 12/20/2018    History of present illness Pt is a 73 y/o nursing home resident who presents with AMS. MRI negative, and acute metabolic encephalopathy suspected to be 2 dehydration and hypernatremia. Possible UTI as well. PMH: stroke, CKD, Dry eyes, DM, hyperlipidemia   OT comments  Pt's level of alertness increased once seated on EOB this session but still requiring +2 for safety with mobility. Pt able to tolerate sitting EOB for 15 minutes this session. He was able to utilize R UE to wipe mouth with min A while seated EOB as well.  Pt will continue to benefit from OT intervention to address functional deficits.  Follow Up Recommendations  SNF    Equipment Recommendations  None recommended by OT    Recommendations for Other Services      Precautions / Restrictions Precautions Precautions: None Restrictions Weight Bearing Restrictions: No       Mobility Bed Mobility Overal bed mobility: Needs Assistance Bed Mobility: Supine to Sit;Sit to Supine     Supine to sit: Total assist;+2 for physical assistance Sit to supine: Total assist;+2 for physical assistance;+2 for safety/equipment   General bed mobility comments: Bed pad and +2 assist required for transition to/from EOB. Pt able to tolerate sitting EOB ~15 minutes with min guard to mod assist for sitting balance. Pt was assisted to lean onto R elbow and then L elbow with max assist to return to midline.   Transfers                 General transfer comment: not attempted secondary to safety but can transfer with maxi to chair with nursing    Balance Overall balance assessment: Needs assistance Sitting-balance support: No upper extremity supported;Feet supported Sitting balance-Leahy Scale: Poor Sitting balance - Comments: posterior lean        ADL either performed or assessed with clinical judgement    ADL       Grooming: Sitting;Minimal assistance     General ADL Comments: Paper towel placed in R hand and pt able to wipe mouth with it with min A and min cuing               Cognition Arousal/Alertness: Lethargic Behavior During Therapy: Flat affect Overall Cognitive Status: Difficult to assess Area of Impairment: Attention;Orientation;Memory;Following commands;Awareness;Safety/judgement;Problem solving    Orientation Level: Disoriented to;Time;Situation Current Attention Level: Focused Memory: Decreased recall of precautions;Decreased short-term memory Following Commands: Follows one step commands inconsistently Safety/Judgement: Decreased awareness of safety;Decreased awareness of deficits Awareness: Intellectual Problem Solving: Slow processing;Decreased initiation;Difficulty sequencing;Requires verbal cues;Requires tactile cues General Comments: More alert today. Able to recall he is in the hospital when given choices. Asked to be called Julian Tyler                   Pertinent Vitals/ Pain       Pain Assessment: Faces Pain Score: 0-No pain Faces Pain Scale: No hurt         Frequency  Min 1X/week        Progress Toward Goals  OT Goals(current goals can now be found in the care plan section)  Progress towards OT goals: Progressing toward goals  Acute Rehab OT Goals Patient Stated Goal: unable to state goals  Potential to Achieve Goals: Julian Tyler    Co-evaluation    PT/OT/SLP Co-Evaluation/Treatment: Yes Reason for Co-Treatment: Complexity of the patient's impairments (multi-system  involvement);Necessary to address cognition/behavior during functional activity;For patient/therapist safety;To address functional/ADL transfers PT goals addressed during session: Mobility/safety with mobility;Balance;Proper use of DME;Strengthening/ROM OT goals addressed during session: ADL's and self-care;Strengthening/ROM      AM-PAC  OT "6 Clicks" Daily Activity     Outcome Measure   Help from another person eating meals?: Total Help from another person taking care of personal grooming?: A Lot Help from another person toileting, which includes using toliet, bedpan, or urinal?: Total Help from another person bathing (including washing, rinsing, drying)?: Total Help from another person to put on and taking off regular upper body clothing?: Total Help from another person to put on and taking off regular lower body clothing?: Total 6 Click Score: 7    End of Session    OT Visit Diagnosis: Unsteadiness on feet (R26.81);Muscle weakness (generalized) (M62.81);Other symptoms and signs involving cognitive function;Adult, failure to thrive (R62.7)   Activity Tolerance Patient limited by fatigue;Patient limited by lethargy   Patient Left in bed;with call bell/phone within reach;with bed alarm set           Time: 8875-7972 OT Time Calculation (min): 21 min  Charges: OT General Charges $OT Visit: 1 Visit OT Treatments $Therapeutic Activity: 8-22 mins   Julian Tyler P, MS, OTR/L 12/20/2018, 11:17 AM

## 2018-12-20 NOTE — Progress Notes (Signed)
PROGRESS NOTE  Julian Tyler ZOX:[id number] DOB: 08-Sep-1945 DOA: 12/11/2018 PCP: Milana Na., MD  Brief History   73 year old man complex past medical history including stroke with residual right-sided deficits, diabetes mellitus, dementia, recent complex hospitalization where he was treated for seizures and delirium in the setting of dementia who presented to the emergency department 6/30 as a code stroke, reported left facial droop and left arm weakness.  Stat EEG no seizure.  MRI negative.  Admitted for acute metabolic encephalopathy, hypernatremia, dehydration, acute kidney injury, possible UTI.  Seen by neurology but no specific neurologic diagnoses made.  Symptoms attributed to hyponatremia.  However the patient has not significantly improved despite correction of hypernatremia.  A & P  Acute metabolic encephalopathy superimposed on baseline dementia thought secondary to dehydration and hypernatremia.  MRI of the brain without acute findings and EEG without epileptiform discharges.  Seen by neurology, felt his symptoms were related to metabolic encephalopathy given sodium of 158 on admission.  --Also of note patient recently started on Seroquel, Xanax and Depakote for combativeness prior to admission. --Symptoms persist and one would not expect this kind of encephalopathy from hypernatremia.  Regardless symptoms persist with resolution of hypernatremia. --ABG and ammonia were unremarkable. --Repeat EEG unremarkable.  MRI brain on admission was negative as were CT head, neck and CT cerebral perfusion. --Continue Keppra and Vimpat for seizure control --Patient remains awake to voice, even opened his eyes today briefly to command, he is able to state his name and follow simple command by moving his left side.  However he does not fully participate.  Seizure disorder --No definite seizures noted.  EEG unrevealing. --Continue Keppra, Vimpat  RPR and T palladium antibodies were  positive --Significance of this is unclear, will discuss with infectious disease 7/10  Abnormal serum and protein electrophoresis of unclear significance --Will discuss with oncology 7/10  Acute kidney injury, hypernatremia --Resolved.  Presumably secondary to failure to thrive. --BMP in AM  Diabetes mellitus type 2 --Remains stable.  Continue sliding scale insulin.  Essential hypertension --stable. Continue IV Lopressor  Dementia with behavioral disturbance --Continue supportive care  Severe protein calorie malnutrition, failure to thrive, failed swallow evaluation --None since admission 6/30. --continue IVF --Palliative care following    Borderline microcytic anemia, acute on chronic, somewhat lower today although has been between 7-8 for the last 4 days. --Remained stable.  Follow clinically.  Abnormal echocardiogram with possible mobile density on aortic valve.  Blood cultures no growth --Blood cultures no growth and patient afebrile.  Transesophageal echocardiogram deferred from 7/3 up to this point given the patient's poor clinical status  Obesity unspecified  PMH CVA affecting the right side and slurred speech at baseline  Obesity unspecified --Per dietitian  Goals of care --Seen by palliative care 7/3 and 7/4 with multiple family members involved.  At that time the family was unable to make any decisions.   Discussed with by telephone with son [name], he and his mother have decided they wish to pursue temporary feeding tube placement.  They also plan to discuss further goals with palliative medicine tomorrow.  DVT prophylaxis: enoxaparin Code Status: Full Family Communication: none Disposition Plan: uncertain    Murray Hodgkins, MD  [facility] Direct contact: see [email or web address] (further directions at bottom of note if needed) 7PM-7AM contact night coverage as at bottom of note 12/20/2018, 9:26 AM  LOS: 8 days   Consultants  . Neurology .  Palliative medicine  Procedures  . EEG IMPRESSION: This is an  abnormal EEG secondary to general background slowing.  This finding may be seen with a diffuse disturbance that is etiologically nonspecific, but may include a metabolic encephalopathy, among other possibilities.  Despite the patient having multiple episodes with upper extremity shaking and moaning, no epileptiform activity is noted.   EEG 7/7 Clinical Interpretation: This EEG reveals a focal area of abnormality in the left temporal region.  This is relatively nonspecific, can be seen in multiple conditions including mass lesions, stroke, neurodegenerative conditions, postictal states, among other causes.  There is also evidence of a nonspecific generalized cerebral dysfunction (encephalopathy).  There was no seizure or evidence of a seizure predisposition seen on the study.   Echo IMPRESSIONS    1. The left ventricle has normal systolic function with an ejection fraction of 60-65%. The cavity size was normal. There is mildly increased left ventricular wall thickness. Left ventricular diastolic Doppler parameters are consistent with impaired  relaxation. No evidence of left ventricular regional wall motion abnormalities.  2. The right ventricle has normal systolic function. The cavity was normal. There is no increase in right ventricular wall thickness.  3. Trivial pericardial effusion is present.  4. Mild calcification of the mitral valve leaflet. There is mild mitral annular calcification present. No evidence of mitral valve stenosis. No significant mitral regurgitation.  5. The aortic valve is tricuspid. Moderate calcification of the aortic valve. No stenosis of the aortic valve. In the parasternal long axis view, there appears to be a small mobile mass on the atrial size of the aortic valve, possible vegetation. Poor  images on this study, cannot see aortic valve well in other views. Would suggest TEE to evaluate more closely.   6. The aortic root is normal in size and structure.  7. IVC was normal in size. PA systolic pressure 35 mmHg.  8. Technically difficult study with poor acoustic windows.  Antibiotics  .   Interval History/Subjective  Interval events noted, family has been difficult to contact despite multiple attempts.  Nursing noted that the patient does not respond to sternal rub.  However, the patient easily responds to voice this morning and follows some simple commands.  He denies complaints, no pain.  No needs voiced.  Patient was also able to work with physical therapy today and focus was on sitting balance and improving sitting posture intolerance.  He also worked with occupational therapy and was able to wipe his mouth with his right arm.  Objective   Vitals:  Vitals:   12/20/18 0428 12/20/18 0854  BP: (!) 146/64 (!) 144/67  Pulse: 99 93  Resp: 15 20  Temp: 99.4 F (37.4 C) 99.6 F (37.6 C)  SpO2: 100% 100%    Exam:  Constitutional:   . Appears calm and comfortable, awakens easily to voice, follows some commands and does answer multiple questions. Eyes:  . Briefly opens to command but mostly keeps shot ENMT:  . grossly normal hearing  . Lips appear normal Respiratory:  . CTA bilaterally, no w/r/r.  . Respiratory effort normal.  Cardiovascular:  . RRR, no m/r/g . No significant LE extremity edema   Abdomen:  . Soft, nontender, nondistended Musculoskeletal:  . Digits/nails BUE: no clubbing, cyanosis, petechiae, infection . Moves left and right arm, does not move legs to command Neurologic:  . Difficult to assess as he only follows some commands. Psychiatric:  . Mental status . Mood, affect difficult to judge.  He follows some commands but does not follow all, he appears to be  picking and choosing.  He clearly states his name when asked but does not answer other questions in regard to orientation.   I have personally reviewed the following:   Today's Data  . CBG  stable. . Hemoglobin stable at 7.5.  Lab Data  .   Micro Data  . Blood cultures pending  Imaging  . CT head and CT angiogram head no large vessel occlusion.  Advanced intracranial atherosclerosis . MRI brain no acute stroke, mass-effect.  Moderate to severe chronic microvascular ischemic changes and volume loss. . VQ scan very low probability . Chest x-ray no acute disease  Cardiology Data  .   Other Data  .   Scheduled Meds: . aspirin  300 mg Rectal Daily   Or  . aspirin  325 mg Oral Daily  . chlorhexidine  15 mL Mouth Rinse BID  . dorzolamide  1 drop Both Eyes BID  . enoxaparin (LOVENOX) injection  40 mg Subcutaneous Q24H  . insulin aspart  0-9 Units Subcutaneous Q4H  . mouth rinse  15 mL Mouth Rinse q12n4p  . metoprolol tartrate  5 mg Intravenous Q6H  . nicotine  21 mg Transdermal Daily  . potassium chloride  10 mEq Oral Daily   Continuous Infusions: . dextrose 50 mL/hr at 12/20/18 0639  . lacosamide (VIMPAT) IV 100 mg (12/20/18 0843)  . levETIRAcetam 1,000 mg (12/20/18 0151)    Principal Problem:   Metabolic encephalopathy Active Problems:   Diabetes mellitus type 2, uncontrolled (HCC)   Goals of care, counseling/discussion   Severe protein-calorie malnutrition ([place])   Anemia   Palliative care by specialist   Vascular dementia without behavioral disturbance ([place])   Seizure ([place])   LOS: 8 days   How to contact the [facility] Attending or Consulting provider [place] or covering provider during after hours [place], for this patient?  1. Check the care team in [facility] and look for a) attending/consulting [facility] provider listed and b) the [facility] team listed 2. Log into [email or web address] and use [place]'s universal password to access. If you do not have the password, please contact the hospital operator. 3. Locate the [facility] provider you are looking for under [facility] and page to a number that you can be directly reached. 4. If you still have difficulty reaching the  provider, please page the [facility] (Director on Call) for the Hospitalists listed on amion for assistance.   Time spent reviewing the chart on this complex case, formulating plan, discussing with palliative medicine and son greater than 50 minutes, greater than 50% in counseling and coordination of care  Per Dr. Erlinda Hong: Admitted at [facility] regional 5/22-6/11 He had slurry speech and speaking difficulty admitted on 11/02/18. Stroke work up ruled out acute CVA but found to right arm numbess and right facial twitching along with disorientation. Loaded with Keppra. On 11/06/18 have seizure with facial twitching, right gaze and AMS. vimpat added. EEG showed no seizure on 11/06/18. He developed delirium in the setting of underlying dementia. Psych consulted and he was put on scheduled haldol and later changed to risperdone with ativan and haldol PRN. He was discharged with keppra and vimpat along with ASA and statin on 11/22/18 to [facility].

## 2018-12-20 NOTE — Progress Notes (Signed)
Physical Therapy Treatment Patient Details Name: Julian Tyler MRN: [id number] DOB: Feb 20, 1946 Today's Date: 12/20/2018    History of Present Illness Pt is a 73 y/o nursing home resident who presents with AMS. MRI negative, and acute metabolic encephalopathy suspected to be 2 dehydration and hypernatremia. Possible UTI as well. PMH: stroke, CKD, Dry eyes, DM, hyperlipidemia    PT Comments    Pt able to tolerate EOB activity ~15 minutes this session. Focus of session was sitting balance, improved sitting posture, and increasing tolerance for function activity. Pt tolerated pec stretch x3 with focus on cervical extension to neutral during stretch. Discussed recommendation for air mattress with RN to preserve skin integrity. Will continue to follow.    Follow Up Recommendations  SNF;Supervision/Assistance - 24 hour     Equipment Recommendations  None recommended by PT    Recommendations for Other Services       Precautions / Restrictions Precautions Precautions: None Restrictions Weight Bearing Restrictions: No    Mobility  Bed Mobility Overal bed mobility: Needs Assistance Bed Mobility: Supine to Sit;Sit to Supine     Supine to sit: Total assist;+2 for physical assistance Sit to supine: Total assist;+2 for physical assistance;+2 for safety/equipment   General bed mobility comments: Bed pad and +2 assist required for transition to/from EOB. Pt able to tolerate sitting EOB ~15 minutes with min guard to mod assist for sitting balance. Pt was assisted to lean onto R elbow and then L elbow with max assist to return to midline.   Transfers                 General transfer comment: Pt appropriate for OOB to chair with maxi-move lift with nursing staff at this time.   Ambulation/Gait                 Stairs             Wheelchair Mobility    Modified Rankin (Stroke Patients Only)       Balance Overall balance assessment: Needs  assistance Sitting-balance support: No upper extremity supported;Feet supported Sitting balance-Leahy Scale: Poor Sitting balance - Comments: posterior lean                                    Cognition Arousal/Alertness: Lethargic Behavior During Therapy: Flat affect Overall Cognitive Status: Difficult to assess Area of Impairment: Attention;Orientation;Memory;Following commands;Awareness;Safety/judgement;Problem solving                 Orientation Level: Disoriented to;Time;Situation Current Attention Level: Focused Memory: Decreased recall of precautions;Decreased short-term memory Following Commands: Follows one step commands inconsistently Safety/Judgement: Decreased awareness of safety;Decreased awareness of deficits Awareness: Intellectual Problem Solving: Slow processing;Decreased initiation;Difficulty sequencing;Requires verbal cues;Requires tactile cues General Comments: More alert today. Able to recall he is in the Tyler when given choices. Asked to be called Julian Tyler      Exercises      General Comments        Pertinent Vitals/Pain Pain Assessment: Faces Faces Pain Scale: No hurt    Home Living                      Prior Function            PT Goals (current goals can now be found in the care plan section) Acute Rehab PT Goals Patient Stated Goal: unable to state goals  PT Goal Formulation: Patient  unable to participate in goal setting Time For Goal Achievement: 01/03/19 Potential to Achieve Goals: Fair Progress towards PT goals: Goals downgraded-see care plan    Frequency    Min 2X/week      PT Plan Current plan remains appropriate    Co-evaluation PT/OT/SLP Co-Evaluation/Treatment: Yes Reason for Co-Treatment: Complexity of the patient's impairments (multi-system involvement);Necessary to address cognition/behavior during functional activity;For patient/therapist safety;To address functional/ADL transfers PT goals  addressed during session: Mobility/safety with mobility;Balance;Proper use of DME;Strengthening/ROM        AM-PAC PT "6 Clicks" Mobility   Outcome Measure  Help needed turning from your back to your side while in a flat bed without using bedrails?: Total Help needed moving from lying on your back to sitting on the side of a flat bed without using bedrails?: Total Help needed moving to and from a bed to a chair (including a wheelchair)?: Total Help needed standing up from a chair using your arms (e.g., wheelchair or bedside chair)?: Total Help needed to walk in Tyler room?: Total Help needed climbing 3-5 steps with a railing? : Total 6 Click Score: 6    End of Session Equipment Utilized During Treatment: Gait belt Activity Tolerance: Patient limited by lethargy Patient left: in bed;with call bell/phone within reach;with bed alarm set Nurse Communication: Mobility status PT Visit Diagnosis: Other abnormalities of gait and mobility (R26.89);Muscle weakness (generalized) (M62.81)     Time: 8338-2505 PT Time Calculation (min) (ACUTE ONLY): 21 min  Charges:  $Therapeutic Activity: 8-22 mins                     Rolinda Roan, PT, DPT Acute Rehabilitation Services Pager: [phone] Office: [phone]    Thelma Comp 12/20/2018, 11:12 AM

## 2018-12-20 NOTE — Progress Notes (Signed)
Palliative medicine progress note  Patient assessed and chart reviewed. Patient is currently working with PT/OT. He is alert but with limited verbal responses, one word answers.  He continues to fail  attempts by SLP to assess readiness for po intake. Per notes prolonged oral manipulation without swallowing, require suctioning to clear applesauce.  He remains NPO.  Patient has now been NPO for 6 days. Outstanding decisions related to  Code status and Coretrak/PEG placement for nutritional support.   Placed call to son/Joseph Green/  designated family contact and left message. Also phone message left with his wife. Stressed importance of call back.   Patient Profile: 73 y.o. male  with past medical history of dementia, seizure disorder, hypertension, hyperlipidemia, diabetes type 2, history of CVA admitted on 12/11/2018 with failure to thrive picture, and altered mental status, hyper natremia.  Code stroke was called on patient as he had significant facial droop.  MRI of the brain performed on 12/11/2018 showed no acute processes, no CVA; moderate to severe atrophy as well as microvascular changes.  Other imaging reveals advanced intracranial arterial occlusions without eminent large vessel occlusions been present.  Plan  Full Code  Continue with current plan of care  Multiple attempts to contact family with goal of continued discussions regarding GOC (code status and Coretrak/PEG).   PMT will continue to support.   Prognosis Undetermined. Patient is high risk for aspiration.      At this point, patient would qualify for his hospice benefit, with likely a prognosis of 6 months or less if things were to continue to go the way they are now.  If family elects a more comfort, symptom based approach, and he is not able to eat, he would qualify for residential hospice  Disposition To be determined   Son returned call and a planned telephone conversation is for tomorrow at 4 PM  Total Time: 20  min.   Greater than 50%  of this time was spent counseling and coordinating care related to the above assessment and plan.  Wadie Lessen NP  Palliative Medicine Team Team Phone # [phone] Pager  [phone]

## 2018-12-21 LAB — GLUCOSE, CAPILLARY
Glucose-Capillary: 157 mg/dL — ABNORMAL HIGH (ref 70–99)
Glucose-Capillary: 165 mg/dL — ABNORMAL HIGH (ref 70–99)
Glucose-Capillary: 189 mg/dL — ABNORMAL HIGH (ref 70–99)
Glucose-Capillary: 190 mg/dL — ABNORMAL HIGH (ref 70–99)
Glucose-Capillary: 193 mg/dL — ABNORMAL HIGH (ref 70–99)
Glucose-Capillary: 196 mg/dL — ABNORMAL HIGH (ref 70–99)

## 2018-12-21 LAB — BASIC METABOLIC PANEL
Anion gap: 7 (ref 5–15)
BUN: 10 mg/dL (ref 8–23)
CO2: 24 mmol/L (ref 22–32)
Calcium: 8.7 mg/dL — ABNORMAL LOW (ref 8.9–10.3)
Chloride: 106 mmol/L (ref 98–111)
Creatinine, Ser: 0.75 mg/dL (ref 0.61–1.24)
GFR calc Af Amer: >60 mL/min (ref >60)
GFR calc non Af Amer: >60 mL/min (ref >60)
Glucose, Bld: 184 mg/dL — ABNORMAL HIGH (ref 70–99)
Potassium: 3.5 mmol/L (ref 3.5–5.1)
Sodium: 137 mmol/L (ref 135–145)

## 2018-12-21 MED ORDER — JEVITY 1.2 CAL PO LIQD: 1000.0000 mL | ORAL | Status: DC

## 2018-12-21 MED ORDER — POLYETHYLENE GLYCOL 3350 17 G PO PACK
17.0000 g | PACK | Freq: Two times a day (BID) | ORAL | Status: DC
  Administered 2018-12-21 – 2019-01-24 (×46): 17 g
  Filled 2018-12-21 (×46): qty 1

## 2018-12-21 MED ORDER — JEVITY 1.5 CAL/FIBER PO LIQD
1000.0000 mL | ORAL | Status: DC
  Administered 2018-12-21 – 2019-01-23 (×21): 1000 mL
  Filled 2018-12-21 (×43): qty 1000

## 2018-12-21 MED ORDER — PRO-STAT SUGAR FREE PO LIQD
30.0000 mL | Freq: Every day | ORAL | Status: DC
  Administered 2018-12-21 – 2019-01-24 (×31): 30 mL
  Filled 2018-12-21 (×30): qty 30

## 2018-12-21 NOTE — Progress Notes (Addendum)
PROGRESS NOTE  Masaki Rothbauer GNO:[id number] DOB: 05-11-1946 DOA: 12/11/2018 PCP: Milana Na., MD  Brief History   73 year old man complex past medical history including stroke with residual right-sided deficits, diabetes mellitus, dementia, recent complex hospitalization where he was treated for seizures and delirium in the setting of dementia who presented to the emergency department 6/30 as a code stroke, reported left facial droop and left arm weakness.  Stat EEG no seizure.  MRI negative.  Admitted for acute metabolic encephalopathy, hypernatremia, dehydration, acute kidney injury, possible UTI.  Seen by neurology but no specific neurologic diagnoses made.  Symptoms attributed to hyponatremia.  However the patient has not significantly improved despite correction of hypernatremia.  A & P  Acute metabolic encephalopathy superimposed on baseline dementia thought secondary to dehydration and hypernatremia.  MRI of the brain without acute findings and EEG without epileptiform discharges.  Seen by neurology, felt his symptoms were related to metabolic encephalopathy given sodium of 158 on admission. Also of note patient recently started on Seroquel, Xanax and Depakote for combativeness prior to admission. ABG and ammonia were unremarkable. --Symptoms persist and one would not expect this kind of encephalopathy from hypernatremia.  Regardless symptoms persist with resolution of hypernatremia. --Repeat EEG unremarkable.  MRI brain on admission was negative as were CT head, neck and CT cerebral perfusion. --Continue Keppra and Vimpat for seizure control --More awake today.  Follow some simple commands.  Does participate somewhat in conversation today.  Seizure disorder --No seizures noted.  EEG unrevealing. --Continue Keppra, Vimpat  RPR and T palladium antibodies were positive --Discussed in detail with infectious disease Dr. Prince Rome, reviewed history and chart. This is felt to be most  likely a false positive, could also represent treatment in the past.  Based on the history and clinical presentation this is not thought to be relevant to the patient's current condition and no treatment is recommended.  The titer itself while elevated is nondiagnostic.  Screening HIV is recommended.  Based on the patient's clinical course, consideration could be given to repeating this test in 1 month.  Abnormal serum and protein electrophoresis --Discussed with Dr. Alvy Bimler; kappa and lambda chains were appropriately elevated in the context of renal dysfunction.  There was no M spike.  These tests are unremarkable and no further evaluation is suggested.  Acute kidney injury, hypernatremia --Resolved.  Presumably secondary to failure to thrive. --Renal function appears stable  Diabetes mellitus type 2 --Blood sugars remain stable.  Continue sliding scale insulin.  Essential hypertension --Remains stable. Continue IV Lopressor  Dementia with behavioral disturbance --Continue supportive care.  More interactive today.  Severe protein calorie malnutrition, failure to thrive, failed swallow evaluation --None since admission 6/30. --Family desired Cortrak placement and initiation of temporary tube feeds.Starting tube feeds 7/10.  Borderline microcytic anemia, acute on chronic, somewhat lower today although has been between 7-8 for the last 4 days. --Stable.  Follow clinically.  Abnormal echocardiogram with possible mobile density on aortic valve.  Blood cultures no growth --Blood cultures remain no growth and patient afebrile, low-grade temp only today.  Transesophageal echocardiogram deferred from 7/3 up to this point given the patient's poor clinical status  Obesity unspecified  PMH CVA affecting the right side and slurred speech at baseline  Obesity unspecified --Per dietitian  Goals of care --Seen by palliative care 7/3 and 7/4 with multiple family members involved.  At that time  the family was unable to make any decisions.  Await further follow-up.  Palliative meeting with son  at 415.  I will follow-up thereafter.   Prognosis remains guarded, start tube feeds today.  DVT prophylaxis: enoxaparin Code Status: Full Family Communication: I updated son [name] by telephone, we discussed his father's current condition, the work-up thus far as well as the abnormal echocardiogram and RPR; we discussed that these abnormalities are not felt to be contributing to the current condition.  He is unaware whether his father may have had syphilis in the past.  He does not want to pursue a TEE which is quite reasonable in this context. Disposition Plan: uncertain    Murray Hodgkins, MD  [facility] Direct contact: see [email or web address] (further directions at bottom of note if needed) 7PM-7AM contact night coverage as at bottom of note 12/21/2018, 2:46 PM  LOS: 9 days   Consultants  . Neurology . Palliative medicine  Procedures  . EEG IMPRESSION: This is an abnormal EEG secondary to general background slowing.  This finding may be seen with a diffuse disturbance that is etiologically nonspecific, but may include a metabolic encephalopathy, among other possibilities.  Despite the patient having multiple episodes with upper extremity shaking and moaning, no epileptiform activity is noted.   EEG 7/7 Clinical Interpretation: This EEG reveals a focal area of abnormality in the left temporal region.  This is relatively nonspecific, can be seen in multiple conditions including mass lesions, stroke, neurodegenerative conditions, postictal states, among other causes.  There is also evidence of a nonspecific generalized cerebral dysfunction (encephalopathy).  There was no seizure or evidence of a seizure predisposition seen on the study.   Echo IMPRESSIONS    1. The left ventricle has normal systolic function with an ejection fraction of 60-65%. The cavity size was normal. There is  mildly increased left ventricular wall thickness. Left ventricular diastolic Doppler parameters are consistent with impaired  relaxation. No evidence of left ventricular regional wall motion abnormalities.  2. The right ventricle has normal systolic function. The cavity was normal. There is no increase in right ventricular wall thickness.  3. Trivial pericardial effusion is present.  4. Mild calcification of the mitral valve leaflet. There is mild mitral annular calcification present. No evidence of mitral valve stenosis. No significant mitral regurgitation.  5. The aortic valve is tricuspid. Moderate calcification of the aortic valve. No stenosis of the aortic valve. In the parasternal long axis view, there appears to be a small mobile mass on the atrial size of the aortic valve, possible vegetation. Poor  images on this study, cannot see aortic valve well in other views. Would suggest TEE to evaluate more closely.  6. The aortic root is normal in size and structure.  7. IVC was normal in size. PA systolic pressure 35 mmHg.  8. Technically difficult study with poor acoustic windows.  Antibiotics  .   Interval History/Subjective  Denies complaints.  Quite awake today. Cortrak tube placed.  Objective   Vitals:  Vitals:   12/21/18 0806 12/21/18 1241  BP: (!) 143/67 (!) 143/70  Pulse: 94 94  Resp: 20 20  Temp: 99.6 F (37.6 C) (!) 100.6 F (38.1 C)  SpO2: 100% 100%    Exam: Constitutional:   . Appears calm and comfortable Eyes:  . Opens eyes to voice and keeps them open for most of the conversation ENMT:  . grossly normal hearing  Respiratory:  . CTA bilaterally, no w/r/r.  . Respiratory effort normal. Cardiovascular:  . RRR, no m/r/g . No significant LE extremity edema   Abdomen:  . Soft,  nontender, nondistended Musculoskeletal:  . Moves both left arm and left leg to command.  Some jerking movement of the right arm.  Does not move right leg to command. Skin:  . No  rashes, lesions, ulcers noted Psychiatric:  . Mental status . The most awake I have seen him.  Keeps eyes open for most of examination.  Answer some simple questions such as his name but does not carry on a conversation.   I have personally reviewed the following:   Today's Data  . CBG stable . BMP unremarkable . Blood cultures no growth to date  Lab Data  .   Micro Data  . Blood cultures pending  Imaging  . CT head and CT angiogram head no large vessel occlusion.  Advanced intracranial atherosclerosis . MRI brain no acute stroke, mass-effect.  Moderate to severe chronic microvascular ischemic changes and volume loss. . VQ scan very low probability . Chest x-ray no acute disease  Cardiology Data  .   Other Data  .   Scheduled Meds: . aspirin  300 mg Rectal Daily   Or  . aspirin  325 mg Oral Daily  . chlorhexidine  15 mL Mouth Rinse BID  . dorzolamide  1 drop Both Eyes BID  . enoxaparin (LOVENOX) injection  40 mg Subcutaneous Q24H  . feeding supplement (PRO-STAT SUGAR FREE 64)  30 mL Per Tube Daily  . insulin aspart  0-9 Units Subcutaneous Q4H  . mouth rinse  15 mL Mouth Rinse q12n4p  . metoprolol tartrate  5 mg Intravenous Q6H  . nicotine  21 mg Transdermal Daily   Continuous Infusions: . dextrose 5 % and 0.45 % NaCl with KCl 20 mEq/L 125 mL/hr at 12/21/18 1135  . feeding supplement (JEVITY 1.5 CAL/FIBER)    . lacosamide (VIMPAT) IV 100 mg (12/21/18 1136)  . levETIRAcetam 1,000 mg (12/21/18 1257)    Principal Problem:   Acute metabolic encephalopathy Active Problems:   Diabetes mellitus type 2, uncontrolled (HCC)   Goals of care, counseling/discussion   Severe protein-calorie malnutrition ([place])   Anemia   Palliative care by specialist   Vascular dementia without behavioral disturbance ([place])   Seizure ([place])   LOS: 9 days   How to contact the [facility] Attending or Consulting provider [place] or covering provider during after hours [place], for this patient?   1. Check the care team in [facility] and look for a) attending/consulting [facility] provider listed and b) the [facility] team listed 2. Log into [email or web address] and use [place]'s universal password to access. If you do not have the password, please contact the hospital operator. 3. Locate the [facility] provider you are looking for under [facility] and page to a number that you can be directly reached. 4. If you still have difficulty reaching the provider, please page the [facility] (Director on Call) for the Hospitalists listed on amion for assistance.   Per Dr. Erlinda Hong: Admitted at [facility] 5/22-6/11 He had slurry speech and speaking difficulty admitted on 11/02/18. Stroke work up ruled out acute CVA but found to right arm numbess and right facial twitching along with disorientation. Loaded with Keppra. On 11/06/18 have seizure with facial twitching, right gaze and AMS. vimpat added. EEG showed no seizure on 11/06/18. He developed delirium in the setting of underlying dementia. Psych consulted and he was put on scheduled haldol and later changed to risperdone with ativan and haldol PRN. He was discharged with keppra and vimpat along with ASA and  statin on 11/22/18 to [facility].  Time spent in examination, formulation of plan, coordination of care greater than 35 minutes, discussion with infectious disease and oncology.

## 2018-12-21 NOTE — Progress Notes (Signed)
  Speech Language Pathology Treatment: Dysphagia  Patient Details Name: Julian Tyler MRN: [id number] DOB: [birth year] Today's Date: 12/21/2018 Time: 1340-1400 SLP Time Calculation (min) (ACUTE ONLY): 20 min  Assessment / Plan / Recommendation Clinical Impression  Patient seen to address dysphagia goals with PO trials of ice chips, thin liquids and puree solids. Patient alert and able to follow basic commands with significant amount of cues. He did initiate 2-3 swallows during PO trials, but did not swallow any of the PO's, and kept them in anterior portion of mouth and SLP suctioned out. He did not exhibit adequate oral preparatory phase or awareness to cup even when SLP provided assisted holding of cup in his right hand. No overt s/s of aspiration or penetration observed, but as SLP suctioned significant amount of oral residuals/sectretions, suspect that he is not consistently swallowing his saliva.     HPI HPI: Pt is a 73 y.o. male, w hypertension, hyperlipidemia, dm2, h/o stroke , seizure, dementia, who presented with altered mental status. Per nurse, pt had right side deficit, not able to walk by self, ? Slight slurred speech. Pt recently had seroquel , xanax, and depakote added due to combativeness. MRI of the brain was negative. Acute metabolic encephalopathy suspect to be secondary to to dehydration and hypernatremia. Urinalysis indicated possible infection; he was started on antibiotics.       SLP Plan  Continue with current plan of care       Recommendations  Diet recommendations: NPO Medication Administration: Via alternative means                Oral Care Recommendations: Oral care QID Follow up Recommendations: Skilled Nursing facility;24 hour supervision/assistance SLP Visit Diagnosis: Dysphagia, oral phase (R13.11) Plan: Continue with current plan of care       GO                Julian Tyler 12/21/2018, 5:03 PM   Julian Baller, MA,  CCC-SLP Speech Therapy [facility] Pager: [phone]

## 2018-12-21 NOTE — Procedures (Signed)
Cortrak  Person Inserting Tube:  Mahreen Schewe, RD Tube Type:  Cortrak - 43 inches Tube Location:  Right nare Initial Placement:  Stomach Secured by: Bridle Technique Used to Measure Tube Placement:  Documented cm marking at nare/ corner of mouth Cortrak Secured At:  65 cm   No x-ray is required. RN may begin using tube.   If the tube becomes dislodged please keep the tube and contact the Cortrak team at [email or web address] (password [email or web address]) for replacement.  If after hours and replacement cannot be delayed, place a NG tube and confirm placement with an abdominal x-ray.   Leevi Cullars RD, LDN Clinical Nutrition Pager # - [phone]    

## 2018-12-21 NOTE — Care Management Important Message (Signed)
Important Message  Patient Details  Name: Julian Tyler MRN: [id number] Date of Birth: [birth year]   Medicare Important Message Given:  Yes     Cherryl Babin 12/21/2018, 3:10 PM

## 2018-12-21 NOTE — Plan of Care (Signed)
  Problem: Pain Managment: Goal: General experience of comfort will improve Outcome: Progressing   Problem: Safety: Goal: Ability to remain free from injury will improve Outcome: Progressing   Problem: Education: Goal: Knowledge of General Education information will improve Description: Including pain rating scale, medication(s)/side effects and non-pharmacologic comfort measures Outcome: Not Progressing Note: Pt has been unable to participate with education during my shift due to a lack mentation.

## 2018-12-21 NOTE — Progress Notes (Addendum)
Nutrition Follow-up  DOCUMENTATION CODES:   Obesity unspecified  INTERVENTION:  Initiate Jevity 1.5 @ 20 ml/hr and increase by 10 ml every 6 hours to goal rate of 50 ml/hr.   30 ml Prostat once daily.    Tube feeding regimen provides 1900 kcal (100% of needs), 90 grams of protein, and 912 ml of H2O.   NUTRITION DIAGNOSIS:   Inadequate oral intake related to inability to eat as evidenced by NPO status; ongoing  GOAL:   Patient will meet greater than or equal to 90% of their needs; progressing  MONITOR:   TF tolerance, Skin, Weight trends, Labs, I & O's  REASON FOR ASSESSMENT:   NPO/Clear Liquid Diet, Low Braden    ASSESSMENT:   73 year old male with a history of previous stroke, hypertension, diabetes, seizure disorder, dementia who is a resident of a skilled nursing facility, was brought to the hospital with worsening mental status.  Found to be significantly dehydrated, hypernatremic, with acute kidney injury.  Cortrak NGT placed today. Tube tip in stomach. Family agreeable on enteral nutrition. Goals of care discussion ongoing with family. Noted pt has been NPO for 8 days. RD consulted for enteral/tube feeding initiation and management. RD to initiate tube feeding at low and slow rate to ensure tolerance. RD to continue to monitor.   Labs and medications reviewed.   Diet Order:   Diet Order            Diet NPO time specified  Diet effective now              EDUCATION NEEDS:   Not appropriate for education at this time  Skin:  Skin Assessment: Reviewed RN Assessment  Last BM:  7/8  Height:   Ht Readings from Last 1 Encounters:  12/11/18 5\' 8"  (1.727 m)    Weight:   Wt Readings from Last 1 Encounters:  12/11/18 119.1 kg    Ideal Body Weight:  70 kg  BMI:  There is no height or weight on file to calculate BMI.  Estimated Nutritional Needs:   Kcal:  1900-2100  Protein:  90-110 grams  Fluid:  1.9 - 2.1 L/day    Corrin Parker, MS, RD,  LDN Pager # [phone] After hours/ weekend pager # [phone]

## 2018-12-21 NOTE — Progress Notes (Signed)
Palliative medicine progress note  Patient assessed and chart reviewed.   73 year old man complex past medical history including stroke with residual right-sided deficits, diabetes mellitus, dementia, recent complex hospitalization where he was treated for seizures and delirium in the setting of dementia who presented to the emergency department 6/30 as a code stroke, reported left facial droop and left arm weakness.    Day 10 of hospital stay   Patient remains lethargic, dependent for all ADLs.  He is able to follow simple commands but intermittently.    He continues to fail  attempts by SLP to assess readiness for po intake.  Core-trac was placed today and feeding started.   Placed call to son/Joseph Green/  For scheduled meeting  Continued conversation regarding diagnosis, prognosis, goals of care, end-of-life wishes and options.  We discussed the patient's overall poor prognosis. Discussed concept specific to human mortality and limitations of medical interventions when the body begins to fail to thrive.  Mr. Sharlene Motts a clear understanding of the patient's situation and his main wish for his father is comfort and dignity.  Although he is the main decision maker he has 9 other siblings that he must consider when making decisions related to life prolonging measures.  Some of his other family members struggle with any kind of shift to a full comfort path.  Plan  Full Code- Son will discussed with family  tonight   Continue with current plan of care   PMT will continue to support.   I will call Mr Nyoka Cowden tomorrow at 1200 for definitive answer on code status  Prognosis Undetermined. Patient is high risk for aspiration.      At this point, patient would qualify for his hospice benefit, with likely a prognosis of 6 months or less if things were to continue to go the way they are now.  If family elects a more comfort, symptom based approach, he would qualify for residential  hospice  Disposition To be determined    Total Time:  35 minutes  Greater than 50%  of this time was spent counseling and coordinating care related to the above assessment and plan.  Wadie Lessen NP  Palliative Medicine Team Team Phone # [phone] Pager  [phone]

## 2018-12-22 DIAGNOSIS — E119 Type 2 diabetes mellitus without complications: Secondary | ICD-10-CM

## 2018-12-22 DIAGNOSIS — R1310 Dysphagia, unspecified: Secondary | ICD-10-CM

## 2018-12-22 DIAGNOSIS — R627 Adult failure to thrive: Secondary | ICD-10-CM

## 2018-12-22 LAB — BASIC METABOLIC PANEL
Anion gap: 5 (ref 5–15)
BUN: 7 mg/dL — ABNORMAL LOW (ref 8–23)
CO2: 23 mmol/L (ref 22–32)
Calcium: 8.4 mg/dL — ABNORMAL LOW (ref 8.9–10.3)
Chloride: 107 mmol/L (ref 98–111)
Creatinine, Ser: 0.7 mg/dL (ref 0.61–1.24)
GFR calc Af Amer: >60 mL/min (ref >60)
GFR calc non Af Amer: >60 mL/min (ref >60)
Glucose, Bld: 254 mg/dL — ABNORMAL HIGH (ref 70–99)
Potassium: 3.9 mmol/L (ref 3.5–5.1)
Sodium: 135 mmol/L (ref 135–145)

## 2018-12-22 LAB — GLUCOSE, CAPILLARY
Glucose-Capillary: 187 mg/dL — ABNORMAL HIGH (ref 70–99)
Glucose-Capillary: 230 mg/dL — ABNORMAL HIGH (ref 70–99)
Glucose-Capillary: 233 mg/dL — ABNORMAL HIGH (ref 70–99)
Glucose-Capillary: 247 mg/dL — ABNORMAL HIGH (ref 70–99)
Glucose-Capillary: 257 mg/dL — ABNORMAL HIGH (ref 70–99)
Glucose-Capillary: 262 mg/dL — ABNORMAL HIGH (ref 70–99)

## 2018-12-22 LAB — HIV ANTIBODY (ROUTINE TESTING W REFLEX): HIV Screen 4th Generation wRfx: NONREACTIVE

## 2018-12-22 LAB — MAGNESIUM: Magnesium: 1.6 mg/dL — ABNORMAL LOW (ref 1.7–2.4)

## 2018-12-22 LAB — PHOSPHORUS: Phosphorus: 2 mg/dL — ABNORMAL LOW (ref 2.5–4.6)

## 2018-12-22 MED ORDER — SENNA 8.6 MG PO TABS
1.0000 | ORAL_TABLET | Freq: Every day | ORAL | Status: DC
  Administered 2018-12-22 – 2019-01-23 (×25): 8.6 mg
  Filled 2018-12-22 (×25): qty 1

## 2018-12-22 NOTE — Progress Notes (Signed)
Palliative medicine progress note  73 year old man complex past medical history including stroke with residual right-sided deficits, diabetes mellitus, dementia, recent complex hospitalization where he was treated for seizures and delirium in the setting of dementia who presented to the emergency department 6/30 as a code stroke, reported left facial droop and left arm weakness.   Family report that 6 months ago he was totally independent, taking buses for transportation and living alone with his wife   Day 11 of hospital stay   Patient remains lethargic, dependent for all ADLs.  He is able to follow simple commands but intermittently, not consistantly     Core-trac was placed yesterday and feeding started.   Spoke  to son/Julian Tyler/  By telephone. Continued conversation regarding diagnosis, prognosis, goals of care, end-of-life wishes and options.      [name] was unable to contact his siblings for discussion regarding GOCs, "they are avoiding me", "this makes this so much harder"  Mr. Julian Tyler an understanding of the patient's situation and his main wish for his father is comfort and dignity.  Although he is the main decision maker/ there is no documented HPOA but he tells me patient's wife and he work together,   he has 9 other siblings that he must consider when making decisions related to life prolonging measures.  Some of his other family members struggle with any kind of shift to a full comfort path.  Plan--no change to current treatment plan, continue all active interventions to treat the treatable and prolong life  Full Code-   Continue with current plan of care   PMT will continue to support. --Tentative meeting set for Tuesday at 2:00 with [name] and patient's wife Julian Tyler in person at bedside  Prognosis Undetermined. Patient is high risk for aspiration and decompensation.      At this point, patient would qualify for his hospice benefit, with  likely a prognosis of 6 months or less if things were to continue to decline.  If family elects a more comfort, symptom based approach, he would qualify for residential hospice  Disposition To be determined    Total Time:  35 minutes  Greater than 50%  of this time was spent counseling and coordinating care related to the above assessment and plan.  Wadie Lessen NP  Palliative Medicine Team Team Phone # [phone] Pager  [phone]

## 2018-12-22 NOTE — Progress Notes (Signed)
PROGRESS NOTE  Avanish Cerullo GGY:[id number] DOB: [birth year] DOA: 12/11/2018 PCP: Milana Na., MD  Brief History   73 year old man complex past medical history including stroke with residual right-sided deficits, diabetes mellitus, dementia, recent complex hospitalization where he was treated for seizures and delirium in the setting of dementia who presented to the emergency department 6/30 as a code stroke, reported left facial droop and left arm weakness.  Stat EEG no seizure.  MRI negative.  Admitted for acute metabolic encephalopathy, hypernatremia, dehydration, acute kidney injury, possible UTI.  Seen by neurology but no specific neurologic diagnoses made.  Symptoms attributed to hyponatremia.  However the patient has not significantly improved despite correction of hypernatremia.  A & P  Acute metabolic encephalopathy superimposed on baseline dementia thought secondary to dehydration and hypernatremia.  MRI of the brain without acute findings and EEG without epileptiform discharges.  Seen by neurology, felt his symptoms were related to metabolic encephalopathy given sodium of 158 on admission. Also of note patient recently started on Seroquel, Xanax and Depakote for combativeness prior to admission. ABG and ammonia were unremarkable. Repeat EEG unremarkable.  MRI brain on admission was negative as were CT head, neck and CT cerebral perfusion. --Dramatically improved today.  Keeps eyes open, follows commands and answers simple questions appropriately.  Today when asked how many children he had he correctly responded with 10.   --Continue Keppra and Vimpat for seizure control  Seizure disorder --No seizures noted.  EEG unrevealing. --Continue Keppra, Vimpat  Diabetes mellitus type 2 --Blood sugars stable.  Continue sliding scale insulin.  Essential hypertension --Stable. Continue IV Lopressor  Dementia with behavioral disturbance --Much improved today.  Continue supportive care.     Severe protein calorie malnutrition, failure to thrive, failed swallow evaluation --Family desired Cortrak placement and initiation of temporary tube feeds.Starting tube feeds 7/10. --Continue tube feeds  Borderline microcytic anemia, acute on chronic, somewhat lower today although has been between 7-8 for the last 4 days. --Stable.  Follow clinically.  Abnormal echocardiogram with possible mobile density on aortic valve.  Blood cultures no growth --Blood cultures remain no growth and patient afebrile, low-grade temp only today.  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 Prince Rome, reviewed history and chart. This is felt to be most likely a false positive, could also represent treatment in the past.  Based on the history and clinical presentation this is not thought to be relevant to the patient's current condition and no treatment is recommended.  The titer itself while elevated is nondiagnostic.  Screening HIV is recommended.  Based on the patient's clinical course, consideration could be given to repeating this test in 1 month.  Abnormal serum and protein electrophoresis --Discussed with Dr. Alvy Bimler; kappa and lambda chains were appropriately elevated in the context of renal dysfunction.  There was no M spike.  These tests are unremarkable and no further evaluation is suggested.  Acute kidney injury, hypernatremia --Resolved.  Presumably secondary to failure to thrive. 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  Tentative plan for meeting Tuesday at 2 PM  with [name] and the patient's wife.  Mental status is substantially improved today without specific intervention.  Continue tube feeds.  Continue full care.  DVT prophylaxis: enoxaparin Code Status: Full Family Communication: I updated son [name] by telephone. Disposition Plan: uncertain    Murray Hodgkins, MD  [facility] Direct contact: see [email or web address] (further directions at bottom of note if needed) 7PM-7AM contact night coverage as at bottom of note 12/22/2018, 4:15 PM  LOS: 10 days   Consultants  . Neurology . Palliative medicine  Procedures  . EEG IMPRESSION: This is an abnormal EEG secondary to general background slowing.  This finding may be seen with a diffuse disturbance that is etiologically nonspecific, but may include a metabolic encephalopathy, among other possibilities.  Despite the patient having multiple episodes with upper extremity shaking and moaning, no epileptiform activity is noted.   EEG 7/7 Clinical Interpretation: This EEG reveals a focal area of abnormality in the left temporal region.  This is relatively nonspecific, can be seen in multiple conditions including mass lesions, stroke, neurodegenerative conditions, postictal states, among other causes.  There is also evidence of a nonspecific generalized cerebral dysfunction (encephalopathy).  There was no seizure or evidence of a seizure predisposition seen on the study.   Echo IMPRESSIONS    1. The left ventricle has normal systolic function with an ejection fraction of 60-65%. The cavity size was normal. There is mildly increased left ventricular wall thickness. Left ventricular diastolic Doppler parameters are consistent with impaired  relaxation. No evidence of left ventricular regional wall motion abnormalities.  2. The right ventricle has normal systolic function. The cavity was normal. There is no increase in right ventricular wall thickness.  3. Trivial pericardial effusion is  present.  4. Mild calcification of the mitral valve leaflet. There is mild mitral annular calcification present. No evidence of mitral valve stenosis. No significant mitral regurgitation.  5. The aortic valve is tricuspid. Moderate calcification of the aortic valve. No stenosis of the aortic valve. In the parasternal long axis view, there appears to be a small mobile mass on the atrial size of the aortic valve, possible vegetation. Poor  images on this study, cannot see aortic valve well in other views. Would suggest TEE to evaluate more closely.  6. The aortic root is normal in size and structure.  7. IVC was normal in size. PA systolic pressure 35 mmHg.  8. Technically difficult study with poor acoustic windows.  Antibiotics  .   Interval History/Subjective  Wants to sit up.  He denies other complaints.  Objective   Vitals:  Vitals:   12/22/18 0900 12/22/18 1152  BP: (!) 150/64 (!) 151/73  Pulse: 87 99  Resp: 20 20  Temp: 99.2 F (37.3 C) 99.5 F (37.5 C)  SpO2: 100% 100%    Exam: Constitutional:   . Eyes are open as I entered the room.  He is awake, alert and interactive.  Answers simple questions appropriately and follows commands. Eyes:  . pupils and irises appear normal ENMT:  . grossly normal hearing  Respiratory:  . CTA bilaterally, no w/r/r.  . Respiratory effort normal.  Cardiovascular:  . RRR, no m/r/g . No LE extremity edema   Abdomen:  . Soft, nontender, nondistended Musculoskeletal:  . LUE, RLE, LLE   . Moves all extremities to command Psychiatric:  . Mental status o Mood, affect appropriate o Oriented to self, month, year  I have personally reviewed the following:   Today's Data  . CBG stable . BMP unremarkable . Phosphorus within  normal limits . Magnesium 1.6  Lab Data  .   Micro Data  . Blood cultures pending  Imaging  . CT head and CT angiogram head no large vessel occlusion.  Advanced intracranial atherosclerosis . MRI brain no  acute stroke, mass-effect.  Moderate to severe chronic microvascular ischemic changes and volume loss. . VQ scan very low probability . Chest x-ray no acute disease  Cardiology Data  .   Other Data  .   Scheduled Meds: . aspirin  300 mg Rectal Daily   Or  . aspirin  325 mg Oral Daily  . chlorhexidine  15 mL Mouth Rinse BID  . dorzolamide  1 drop Both Eyes BID  . enoxaparin (LOVENOX) injection  40 mg Subcutaneous Q24H  . feeding supplement (PRO-STAT SUGAR FREE 64)  30 mL Per Tube Daily  . insulin aspart  0-9 Units Subcutaneous Q4H  . mouth rinse  15 mL Mouth Rinse q12n4p  . metoprolol tartrate  5 mg Intravenous Q6H  . nicotine  21 mg Transdermal Daily  . polyethylene glycol  17 g Per Tube BID  . senna  1 tablet Per Tube QHS   Continuous Infusions: . dextrose 5 % and 0.45 % NaCl with KCl 20 mEq/L 125 mL/hr at 12/22/18 0435  . feeding supplement (JEVITY 1.5 CAL/FIBER) 40 mL/hr at 12/22/18 0302  . lacosamide (VIMPAT) IV 100 mg (12/22/18 1102)  . levETIRAcetam 1,000 mg (12/22/18 1557)    Principal Problem:   Acute metabolic encephalopathy Active Problems:   Diabetes mellitus type 2, uncontrolled (HCC)   Goals of care, counseling/discussion   Severe protein-calorie malnutrition ([place])   Anemia   Palliative care by specialist   Vascular dementia without behavioral disturbance ([place])   Seizure ([place])   Dysphagia   Adult failure to thrive   LOS: 10 days   How to contact the [facility] Attending or Consulting provider [place] or covering provider during after hours [place], for this patient?  1. Check the care team in [facility] and look for a) attending/consulting TRH provider listed and b) the [facility] team listed 2. Log into [email or web address] and use [place]'s universal password to access. If you do not have the password, please contact the hospital operator. 3. Locate the [facility] provider you are looking for under [facility] and page to a number that you can be directly reached. 4. If you  still have difficulty reaching the provider, please page the [facility] (Director on Call) for the Hospitalists listed on amion for assistance.   Per Dr. Erlinda Hong: Admitted at [facility] regional 5/22-6/11 He had slurry speech and speaking difficulty admitted on 11/02/18. Stroke work up ruled out acute CVA but found to right arm numbess and right facial twitching along with disorientation. Loaded with Keppra. On 11/06/18 have seizure with facial twitching, right gaze and AMS. vimpat added. EEG showed no seizure on 11/06/18. He developed delirium in the setting of underlying dementia. Psych consulted and he was put on scheduled haldol and later changed to risperdone with ativan and haldol PRN. He was discharged with keppra and vimpat along with ASA and statin on 11/22/18 to [facility].

## 2018-12-23 LAB — BASIC METABOLIC PANEL
Anion gap: 8 (ref 5–15)
BUN: 8 mg/dL (ref 8–23)
CO2: 22 mmol/L (ref 22–32)
Calcium: 8.7 mg/dL — ABNORMAL LOW (ref 8.9–10.3)
Chloride: 103 mmol/L (ref 98–111)
Creatinine, Ser: 0.69 mg/dL (ref 0.61–1.24)
GFR calc Af Amer: >60 mL/min (ref >60)
GFR calc non Af Amer: >60 mL/min (ref >60)
Glucose, Bld: 214 mg/dL — ABNORMAL HIGH (ref 70–99)
Potassium: 4.2 mmol/L (ref 3.5–5.1)
Sodium: 133 mmol/L — ABNORMAL LOW (ref 135–145)

## 2018-12-23 LAB — GLUCOSE, CAPILLARY
Glucose-Capillary: 190 mg/dL — ABNORMAL HIGH (ref 70–99)
Glucose-Capillary: 274 mg/dL — ABNORMAL HIGH (ref 70–99)
Glucose-Capillary: 253 mg/dL — ABNORMAL HIGH (ref 70–99)
Glucose-Capillary: 231 mg/dL — ABNORMAL HIGH (ref 70–99)

## 2018-12-23 LAB — PHOSPHORUS: Phosphorus: 2.3 mg/dL — ABNORMAL LOW (ref 2.5–4.6)

## 2018-12-23 LAB — MAGNESIUM: Magnesium: 1.7 mg/dL (ref 1.7–2.4)

## 2018-12-23 MED ORDER — METOPROLOL SUCCINATE ER 25 MG PO TB24: 25.0000 mg | ORAL_TABLET | Freq: Every day | ORAL | Status: DC

## 2018-12-23 MED ORDER — INSULIN GLARGINE 100 UNIT/ML ~~LOC~~ SOLN
10.0000 [IU] | Freq: Every day | SUBCUTANEOUS | Status: DC
  Administered 2018-12-23 – 2018-12-24 (×2): 10 [IU] via SUBCUTANEOUS
  Filled 2018-12-23 (×2): qty 0.1

## 2018-12-23 MED ORDER — LISINOPRIL 20 MG PO TABS
20.0000 mg | ORAL_TABLET | Freq: Every day | ORAL | Status: DC
  Administered 2018-12-23 – 2019-01-24 (×29): 20 mg
  Filled 2018-12-23 (×30): qty 1

## 2018-12-23 MED ORDER — HYDRALAZINE HCL 50 MG PO TABS
100.0000 mg | ORAL_TABLET | Freq: Two times a day (BID) | ORAL | Status: DC
  Administered 2018-12-23 – 2019-01-24 (×57): 100 mg
  Filled 2018-12-23 (×58): qty 2

## 2018-12-23 MED ORDER — AMLODIPINE BESYLATE 10 MG PO TABS
10.0000 mg | ORAL_TABLET | Freq: Every day | ORAL | Status: DC
  Administered 2018-12-23 – 2019-01-24 (×29): 10 mg
  Filled 2018-12-23 (×31): qty 1

## 2018-12-23 MED ORDER — LEVETIRACETAM 100 MG/ML PO SOLN
1000.0000 mg | Freq: Two times a day (BID) | ORAL | Status: DC
  Administered 2018-12-23 – 2018-12-25 (×4): 1000 mg
  Filled 2018-12-23 (×5): qty 10

## 2018-12-23 MED ORDER — SODIUM CHLORIDE 0.9% FLUSH
10.0000 mL | Freq: Two times a day (BID) | INTRAVENOUS | Status: DC
  Administered 2018-12-23 – 2019-01-10 (×34): 10 mL

## 2018-12-23 MED ORDER — METOPROLOL TARTRATE 25 MG PO TABS
25.0000 mg | ORAL_TABLET | Freq: Two times a day (BID) | ORAL | Status: DC
  Administered 2018-12-23 – 2019-01-24 (×55): 25 mg
  Filled 2018-12-23 (×57): qty 1

## 2018-12-23 MED ORDER — SODIUM CHLORIDE 0.9% FLUSH
10.0000 mL | INTRAVENOUS | Status: DC | PRN
  Administered 2019-01-04: 10 mL
  Filled 2018-12-23: qty 40

## 2018-12-23 MED ORDER — LACOSAMIDE 50 MG PO TABS
100.0000 mg | ORAL_TABLET | Freq: Two times a day (BID) | ORAL | Status: DC
  Administered 2018-12-23 – 2018-12-25 (×4): 100 mg
  Filled 2018-12-23 (×4): qty 2

## 2018-12-23 NOTE — Progress Notes (Signed)
PROGRESS NOTE  Julian Tyler EXB:[id number] DOB: 07-01-1945 DOA: 12/11/2018 PCP: Milana Na., MD  Brief History   73 year old man complex past medical history including stroke with residual right-sided deficits, diabetes mellitus, dementia, recent complex hospitalization where he was treated for seizures and delirium in the setting of dementia who presented to the emergency department 6/30 as a code stroke, reported left facial droop and left arm weakness.  Stat EEG no seizure.  MRI negative.  Admitted for acute metabolic encephalopathy, hypernatremia, dehydration, acute kidney injury, possible UTI.  Seen by neurology but no specific neurologic diagnoses made.  Symptoms attributed to hyponatremia.  However the patient has not significantly improved despite correction of hypernatremia.  A & P  Acute metabolic encephalopathy superimposed on baseline dementia thought secondary to dehydration and hypernatremia.  MRI of the brain without acute findings and EEG without epileptiform discharges.  Seen by neurology, felt his symptoms were related to metabolic encephalopathy given sodium of 158 on admission. Also of note patient recently started on Seroquel, Xanax and Depakote for combativeness prior to admission. ABG and ammonia were unremarkable. Repeat EEG unremarkable.  MRI brain on admission was negative as were CT head, neck and CT cerebral perfusion. --No significant change, less alert today but does open eyes to command and respond to a few simple questions.  Seems to wax and wane. --Continue Keppra and Vimpat  Seizure disorder --No seizures noted.  EEG unrevealing. --Continue Keppra, Vimpat; changed to per tube  Diabetes mellitus type 2 --Blood sugars elevated on tube feeds.  Continue sliding scale insulin.  Add low-dose long-acting insulin and titrate as needed.  Essential hypertension --Stable.  Resume metoprolol per tube, add back amlodipine, lisinopril and hydralazine per tube   Dementia with behavioral disturbance --Waxes and wanes.  Avoid sedatives.  Severe protein calorie malnutrition, failure to thrive, failed swallow evaluation --Family desired Cortrak placement and initiation of temporary tube feeds.Starting tube feeds 7/10. --Tolerating tube feeds, will continue   Borderline microcytic anemia, acute on chronic, somewhat lower today although has been between 7-8 for the last 4 days. --Stable.  Follow clinically.  Abnormal echocardiogram with possible mobile density on aortic valve.  Blood cultures no growth --Blood cultures remain no growth and patient afebrile, low-grade temp only today.  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 Prince Rome, reviewed history and chart. This is felt to be most likely a false positive, could also represent treatment in the past.  Based on the history and clinical presentation this is not thought to be relevant to the patient's current condition and no treatment is recommended.  The titer itself while elevated is nondiagnostic.  Screening HIV is recommended.  Based on the patient's clinical course, consideration could be given to repeating this test in 1 month.  Abnormal serum and protein electrophoresis --Discussed with Dr. Alvy Bimler; kappa and lambda chains were appropriately elevated in the context of renal dysfunction.  There was no M spike.  These tests are unremarkable and no further evaluation is suggested.  Acute kidney injury, hypernatremia --Resolved.  Presumably secondary to failure to thrive. 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  Tentative plan for meeting Tuesday at 2 PM with [name] and the patient's wife.   Mental status waxes and wanes.  Add long-acting insulin.  Convert blood pressure treatment to enteral administration.  Prognosis is guarded.  We will update son by telephone later today.  DVT prophylaxis: enoxaparin Code Status: Full Family Communication:  Disposition Plan: uncertain    Murray Hodgkins, MD  [facility] Direct contact: see [email or web address] (further directions at bottom of note if needed) 7PM-7AM contact night coverage as at bottom of note 12/23/2018, 2:24 PM  LOS: 11 days   Consultants  . Neurology . Palliative medicine  Procedures  . EEG IMPRESSION: This is an abnormal EEG secondary to general background slowing.  This finding may be seen with a diffuse disturbance that is etiologically nonspecific, but may include a metabolic encephalopathy, among other possibilities.  Despite the patient having multiple episodes with upper extremity shaking and moaning, no epileptiform activity is noted.   EEG 7/7 Clinical Interpretation: This EEG reveals a focal area of abnormality in the left temporal region.  This is relatively nonspecific, can be seen in multiple conditions including mass lesions, stroke, neurodegenerative conditions, postictal states, among other causes.  There is also evidence of a nonspecific generalized cerebral dysfunction (encephalopathy).  There was no seizure or evidence of a seizure predisposition seen on the study.   Echo IMPRESSIONS    1. The left ventricle has normal systolic function with an ejection fraction of 60-65%. The cavity size was normal. There is mildly increased left ventricular wall thickness. Left ventricular diastolic Doppler parameters are consistent with impaired  relaxation. No evidence of left ventricular regional wall motion abnormalities.  2. The right ventricle has normal systolic function.  The cavity was normal. There is no increase in right ventricular wall thickness.  3. Trivial pericardial effusion is present.  4. Mild calcification of the mitral valve leaflet. There is mild mitral annular calcification present. No evidence of mitral valve stenosis. No significant mitral regurgitation.  5. The aortic valve is tricuspid. Moderate calcification of the aortic valve. No stenosis of the aortic valve. In the parasternal long axis view, there appears to be a small mobile mass on the atrial size of the aortic valve, possible vegetation. Poor  images on this study, cannot see aortic valve well in other views. Would suggest TEE to evaluate more closely.  6. The aortic root is normal in size and structure.  7. IVC was normal in size. PA systolic pressure 35 mmHg.  8. Technically difficult study with poor acoustic windows.  Antibiotics  .   Interval History/Subjective  Sleeping more today.  Denies complaints.  Objective   Vitals:  Vitals:   12/23/18 0746 12/23/18 1203  BP: (!) 160/74 (!) 152/84  Pulse: 92 98  Resp: 20 20  Temp: 98.9 F (37.2 C) 98.6 F (37 C)  SpO2: 100% 100%    Exam: Constitutional:   . Appears calm and comfortable, opens eyes briefly on command, responds to simple questions briefly Respiratory:  . CTA bilaterally, no w/r/r.  . Respiratory effort normal.  Cardiovascular:  . RRR, no m/r/g . No significant LE extremity edema   Abdomen:  . Soft Psychiatric:  . Mental status . Keeps eyes closed unless asked to open and then only keeps them open briefly.  Mental status seems comparable to last few days, he seems to wax and wane in participation.   I have personally reviewed the following:   Today's Data  . CBG stable .  BMP unremarkable . Magnesium within normal limits, phosphorus borderline low.  Lab Data  .   Micro Data  . Blood cultures pending  Imaging  . CT head and CT angiogram head no large vessel occlusion.  Advanced intracranial  atherosclerosis . MRI brain no acute stroke, mass-effect.  Moderate to severe chronic microvascular ischemic changes and volume loss. . VQ scan very low probability . Chest x-ray no acute disease  Cardiology Data  .   Other Data  .   Scheduled Meds: . aspirin  300 mg Rectal Daily   Or  . aspirin  325 mg Oral Daily  . chlorhexidine  15 mL Mouth Rinse BID  . dorzolamide  1 drop Both Eyes BID  . enoxaparin (LOVENOX) injection  40 mg Subcutaneous Q24H  . feeding supplement (PRO-STAT SUGAR FREE 64)  30 mL Per Tube Daily  . insulin aspart  0-9 Units Subcutaneous Q4H  . mouth rinse  15 mL Mouth Rinse q12n4p  . metoprolol tartrate  5 mg Intravenous Q6H  . nicotine  21 mg Transdermal Daily  . polyethylene glycol  17 g Per Tube BID  . senna  1 tablet Per Tube QHS  . sodium chloride flush  10-40 mL Intracatheter Q12H   Continuous Infusions: . feeding supplement (JEVITY 1.5 CAL/FIBER) 1,000 mL (12/22/18 2242)  . lacosamide (VIMPAT) IV 100 mg (12/23/18 1138)  . levETIRAcetam 1,000 mg (12/22/18 2350)    Principal Problem:   Acute metabolic encephalopathy Active Problems:   Goals of care, counseling/discussion   Severe protein-calorie malnutrition ([place])   Anemia   Palliative care by specialist   Vascular dementia without behavioral disturbance ([place])   Seizure ([place])   Dysphagia   Adult failure to thrive   Type 2 diabetes mellitus without complication ([place])   LOS: 11 days   How to contact the [facility] Attending or Consulting provider 7A - 7P or covering provider during after hours [place], for this patient?  1. Check the care team in [facility] and look for a) attending/consulting TRH provider listed and b) the [facility] team listed 2. Log into [email or web address] and use [place]'s universal password to access. If you do not have the password, please contact the hospital operator. 3. Locate the [facility] provider you are looking for under [facility] and page to a number that you can be directly  reached. 4. If you still have difficulty reaching the provider, please page the [facility] (Director on Call) for the Hospitalists listed on amion for assistance.   Per Dr. Erlinda Hong: Admitted at [facility] regional 5/22-6/11 He had slurry speech and speaking difficulty admitted on 11/02/18. Stroke work up ruled out acute CVA but found to right arm numbess and right facial twitching along with disorientation. Loaded with Keppra. On 11/06/18 have seizure with facial twitching, right gaze and AMS. vimpat added. EEG showed no seizure on 11/06/18. He developed delirium in the setting of underlying dementia. Psych consulted and he was put on scheduled haldol and later changed to risperdone with ativan and haldol PRN. He was discharged with keppra and vimpat along with ASA and statin on 11/22/18 to [facility].

## 2018-12-24 LAB — GLUCOSE, CAPILLARY
Glucose-Capillary: 194 mg/dL — ABNORMAL HIGH (ref 70–99)
Glucose-Capillary: 216 mg/dL — ABNORMAL HIGH (ref 70–99)
Glucose-Capillary: 238 mg/dL — ABNORMAL HIGH (ref 70–99)
Glucose-Capillary: 247 mg/dL — ABNORMAL HIGH (ref 70–99)
Glucose-Capillary: 250 mg/dL — ABNORMAL HIGH (ref 70–99)
Glucose-Capillary: 251 mg/dL — ABNORMAL HIGH (ref 70–99)
Glucose-Capillary: 252 mg/dL — ABNORMAL HIGH (ref 70–99)
Glucose-Capillary: 256 mg/dL — ABNORMAL HIGH (ref 70–99)

## 2018-12-24 LAB — CULTURE, BLOOD (ROUTINE X 2)
Culture: NO GROWTH
Culture: NO GROWTH
Special Requests: ADEQUATE
Special Requests: ADEQUATE

## 2018-12-24 LAB — MAGNESIUM: Magnesium: 1.7 mg/dL (ref 1.7–2.4)

## 2018-12-24 LAB — BASIC METABOLIC PANEL
Anion gap: 8 (ref 5–15)
BUN: 11 mg/dL (ref 8–23)
CO2: 24 mmol/L (ref 22–32)
Calcium: 8.5 mg/dL — ABNORMAL LOW (ref 8.9–10.3)
Chloride: 100 mmol/L (ref 98–111)
Creatinine, Ser: 0.68 mg/dL (ref 0.61–1.24)
GFR calc Af Amer: >60 mL/min (ref >60)
GFR calc non Af Amer: >60 mL/min (ref >60)
Glucose, Bld: 252 mg/dL — ABNORMAL HIGH (ref 70–99)
Potassium: 4.9 mmol/L (ref 3.5–5.1)
Sodium: 132 mmol/L — ABNORMAL LOW (ref 135–145)

## 2018-12-24 LAB — PHOSPHORUS: Phosphorus: 2.7 mg/dL (ref 2.5–4.6)

## 2018-12-24 MED ORDER — ASPIRIN 300 MG RE SUPP
300.0000 mg | Freq: Every day | RECTAL | Status: DC
  Administered 2018-12-30: 300 mg via RECTAL
  Filled 2018-12-24 (×4): qty 1

## 2018-12-24 MED ORDER — ASPIRIN 325 MG PO TABS
325.0000 mg | ORAL_TABLET | Freq: Every day | ORAL | Status: DC
  Administered 2018-12-25 – 2019-01-24 (×27): 325 mg
  Filled 2018-12-24 (×28): qty 1

## 2018-12-24 MED ORDER — INSULIN GLARGINE 100 UNIT/ML ~~LOC~~ SOLN
15.0000 [IU] | Freq: Every day | SUBCUTANEOUS | Status: DC
  Administered 2018-12-25 – 2018-12-28 (×4): 15 [IU] via SUBCUTANEOUS
  Filled 2018-12-24 (×5): qty 0.15

## 2018-12-24 MED ORDER — ZOLPIDEM TARTRATE 5 MG PO TABS
5.0000 mg | ORAL_TABLET | Freq: Every evening | ORAL | Status: DC | PRN
  Administered 2018-12-24 – 2019-01-05 (×4): 5 mg via ORAL
  Filled 2018-12-24 (×4): qty 1

## 2018-12-24 NOTE — Progress Notes (Signed)
SLP Cancellation Note  Patient Details Name: Julian Tyler MRN: [id number] DOB: 01/08/1946   Cancelled treatment:       Reason Eval/Treat Not Completed: Patient's level of consciousness. Patient is sleeping and not arousable and per RN has been this way so far this morning. SLP will attempt this afternoon if able.    Nadara Mode Tarrell 12/24/2018, 10:18 AM    [name], MA, CCC-SLP Speech Therapy [place] [facility] Pager: [phone]

## 2018-12-24 NOTE — Progress Notes (Signed)
Physical Therapy Treatment Patient Details Name: Julian Tyler MRN: [id number] DOB: 12/09/1945 Today's Date: 12/24/2018    History of Present Illness Pt is a 73 y/o nursing home resident who presents with AMS. MRI negative, and acute metabolic encephalopathy suspected to be 2 dehydration and hypernatremia. Possible UTI as well. PMH: stroke, CKD, Dry eyes, DM, hyperlipidemia    PT Comments    Pt lethargic and able to be roused minimally with maximum stimuli and encouragement. Pt opened eyes x3 times throughout session, and grunting answers to questions "Uh-huh". Pt not verbally responding otherwise. Focus of session was attempts at increasing level of consciousness, cervical stretches, and repositioning in bed. Will continue to follow and progress as able per POC.     Follow Up Recommendations  SNF;Supervision/Assistance - 24 hour     Equipment Recommendations  None recommended by PT    Recommendations for Other Services Rehab consult     Precautions / Restrictions Precautions Precautions: None Restrictions Weight Bearing Restrictions: No    Mobility  Bed Mobility               General bed mobility comments: Unable to attempt this session due to decreased level of arousal.   Transfers                 General transfer comment: not attempted secondary to safety but can transfer with maxi to chair with nursing  Ambulation/Gait                 Stairs             Wheelchair Mobility    Modified Rankin (Stroke Patients Only)       Balance       Sitting balance - Comments: Unable to assess                                    Cognition Arousal/Alertness: Lethargic Behavior During Therapy: Flat affect Overall Cognitive Status: Difficult to assess Area of Impairment: Attention;Orientation;Memory;Following commands;Awareness;Safety/judgement;Problem solving                 Orientation Level: Disoriented  to;Time;Situation Current Attention Level: Focused Memory: Decreased recall of precautions;Decreased short-term memory Following Commands: (Does not follow commands) Safety/Judgement: Decreased awareness of safety;Decreased awareness of deficits Awareness: Intellectual Problem Solving: Slow processing;Decreased initiation;Difficulty sequencing;Requires verbal cues;Requires tactile cues General Comments: Pt able to be roused enough to open eyes only 3 times throughout session. Did not follow commands      Exercises Other Exercises Other Exercises: 2x30" L cervical rotation stretch and L upper trap stretch    General Comments        Pertinent Vitals/Pain Pain Assessment: Faces Faces Pain Scale: No hurt    Home Living                      Prior Function            PT Goals (current goals can now be found in the care plan section) Acute Rehab PT Goals Patient Stated Goal: unable to state goals  PT Goal Formulation: Patient unable to participate in goal setting Time For Goal Achievement: 01/03/19 Potential to Achieve Goals: Fair Progress towards PT goals: Not progressing toward goals - comment    Frequency    Min 2X/week      PT Plan Current plan remains appropriate    Co-evaluation  AM-PAC PT "6 Clicks" Mobility   Outcome Measure  Help needed turning from your back to your side while in a flat bed without using bedrails?: Total Help needed moving from lying on your back to sitting on the side of a flat bed without using bedrails?: Total Help needed moving to and from a bed to a chair (including a wheelchair)?: Total Help needed standing up from a chair using your arms (e.g., wheelchair or bedside chair)?: Total Help needed to walk in hospital room?: Total Help needed climbing 3-5 steps with a railing? : Total 6 Click Score: 6    End of Session Equipment Utilized During Treatment: Gait belt Activity Tolerance: Patient limited by  lethargy Patient left: in bed;with call bell/phone within reach;with bed alarm set Nurse Communication: Mobility status PT Visit Diagnosis: Other abnormalities of gait and mobility (R26.89);Muscle weakness (generalized) (M62.81)     Time: 6073-7106 PT Time Calculation (min) (ACUTE ONLY): 14 min  Charges:  $Therapeutic Activity: 8-22 mins                     Rolinda Roan, PT, DPT Acute Rehabilitation Services Pager: [phone] Office: [phone]    Thelma Comp 12/24/2018, 1:42 PM

## 2018-12-24 NOTE — Progress Notes (Signed)
  Speech Language Pathology Treatment: Dysphagia  Patient Details Name: Julian Tyler MRN: [id number] DOB: [birth year] Today's Date: 12/24/2018 Time: 4462-8638 SLP Time Calculation (min) (ACUTE ONLY): 20 min  Assessment / Plan / Recommendation Clinical Impression  Patient seen to address dysphagia goals and was significantly more alert today and eyes open, patient making appropriate requests, "I need to get more straight in the bed" (he was leaning far to right). Oral care performed prior to PO's but patient did not have significant amount of oral secretions to remove. He accepted PO's of puree solids and teaspoon sips of honey thick liquids. He did exhibit delayed oral transit, laryngeal pumping and delayed swallow initiation, he did not exhibit any overt s/s of aspiration or penetration and oral cavity was completely clear post swallows (prior sessions he held PO's in anterior portion of oral cavity despite initiating some swallows.) As patient continues to wax and wane with alertness, plan to continue PO trials prior to initiating PO diet.     HPI HPI: Pt is a 73 y.o. male, w hypertension, hyperlipidemia, dm2, h/o stroke , seizure, dementia, who presented with altered mental status. Per nurse, pt had right side deficit, not able to walk by self, ? Slight slurred speech. Pt recently had seroquel , xanax, and depakote added due to combativeness. MRI of the brain was negative. Acute metabolic encephalopathy suspect to be secondary to to dehydration and hypernatremia. Urinalysis indicated possible infection; he was started on antibiotics.       SLP Plan  Continue with current plan of care       Recommendations  Diet recommendations: NPO Medication Administration: Via alternative means                Oral Care Recommendations: Oral care QID Follow up Recommendations: Skilled Nursing facility;24 hour supervision/assistance SLP Visit Diagnosis: Dysphagia, oral phase (R13.11) Plan:  Continue with current plan of care       GO                Julian Tyler 12/24/2018, 5:38 PM   Julian Baller, MA, [place] [facility] Pager: [phone]

## 2018-12-24 NOTE — Plan of Care (Signed)
  Problem: Nutrition: Goal: Adequate nutrition will be maintained Outcome: Progressing   Problem: Nutrition: Goal: Adequate nutrition will be maintained Outcome: Progressing

## 2018-12-24 NOTE — Progress Notes (Addendum)
Inpatient Diabetes Program Recommendations  AACE/ADA: New Consensus Statement on Inpatient Glycemic Control (2015)  Target Ranges:  Prepandial:   less than 140 mg/dL      Peak postprandial:   less than 180 mg/dL (1-2 hours)      Critically ill patients:  140 - 180 mg/dL   Results for Julian Tyler, Julian Tyler (MRN [id number]) as of 12/24/2018 09:34  Ref. Range 12/23/2018 20:21 12/24/2018 00:11 12/24/2018 03:17 12/24/2018 04:01 12/24/2018 07:46  Glucose-Capillary Latest Ref Range: 70 - 99 mg/dL 231 (H) 194 (H) 216 (H) 252 (H) 238 (H)   Review of Glycemic Control  Diabetes history: DM 2 Outpatient Diabetes medications:  Lantus 30 units q HS, Humalog 5 units tid, Metformin 500 mg q breakfast Current orders for Inpatient glycemic control:  Lantus 10 units Novolog sensitive q 4 hours  Inpatient Diabetes Program Recommendations:    Glucose elevated in 200's after Lantus 10 added. Patient on Tube Feeds  Consider adding Novolog 3 units q 4 hours Tube Feed Coverage (do not give if tube feeds are stopped or held).   Thanks,  Tama Headings RN, MSN, BC-ADM Inpatient Diabetes Coordinator Team Pager [phone] (8a-5p)

## 2018-12-24 NOTE — Progress Notes (Signed)
PROGRESS NOTE  Julian Tyler TMA:[id number] DOB: 1945-09-23 DOA: 12/11/2018 PCP: Julian Na., MD  Brief History   73 year old man complex past medical history including stroke with residual right-sided deficits, diabetes mellitus, dementia, recent complex hospitalization where he was treated for seizures and delirium in the setting of dementia who presented to the emergency department 6/30 as a code stroke, reported left facial droop and left arm weakness.  Stat EEG no seizure.  MRI negative.  Admitted for acute metabolic encephalopathy, hypernatremia, dehydration, acute kidney injury, possible UTI.  Seen by neurology but no specific neurologic diagnoses made.  Symptoms attributed to hyponatremia.  However the patient has not significantly improved despite correction of hypernatremia.  A & P  Acute metabolic encephalopathy superimposed on baseline dementia thought secondary to dehydration and hypernatremia.  MRI of the brain without acute findings and EEG without epileptiform discharges.  Seen by neurology, felt his symptoms were related to metabolic encephalopathy given sodium of 158 on admission. Also of note patient recently started on Seroquel, Xanax and Depakote for combativeness prior to admission. ABG and ammonia were unremarkable. Repeat EEG unremarkable.  MRI brain on admission was negative as were CT head, neck and CT cerebral perfusion. --Very awake and alert today, follows commands and answer simple questions appropriately. --Mental status seems to wax and wane without discrete etiology. --Continue antiepileptics as below  Seizure disorder --No seizures reported.  EEG was unrevealing.  Continue Keppra, Vimpat  Diabetes mellitus type 2 --Blood sugars elevated on tube feeds.  Continue sliding scale insulin.  Continue Lantus, will titrate up.  Essential hypertension --Remains stable.  Continue metoprolol, amlodipine, lisinopril and hydralazine per tube  Dementia with  behavioral disturbance --Waxes and wanes.  Avoid sedatives.  Severe protein calorie malnutrition, failure to thrive, failed swallow evaluation --Family desired Cortrak placement and initiation of temporary tube feeds.Starting tube feeds 7/10. --Tolerating tube feeds.  Continue.  Borderline microcytic anemia, acute on chronic, somewhat lower today although has been between 7-8 for the last 4 days. --Stable.  Follow clinically.  Abnormal echocardiogram with possible mobile density on aortic valve.  Blood cultures no growth --Blood cultures remain no growth and patient afebrile, low-grade temp only today.  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 Prince Tyler, reviewed history and chart. This is felt to be most likely a false positive, could also represent treatment in the past.  Based on the history and clinical presentation this is not thought to be relevant to the patient's current condition and no treatment is recommended.  The titer itself while elevated is nondiagnostic.  Screening HIV is recommended.  Based on the patient's clinical course, consideration could be given to repeating this test in 1 month. 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  Abnormal serum and protein electrophoresis --Discussed with Dr. Alvy Tyler; kappa and lambda chains were appropriately elevated in the context of renal dysfunction.  There was no M spike.  These tests are unremarkable and no further evaluation is suggested.  Acute kidney injury, hypernatremia --Resolved.  Presumably secondary to failure to thrive. --Renal function appears stable  Obesity unspecified  PMH CVA affecting the right  side and slurred speech at baseline  Obesity unspecified --Per dietitian  Goals of care --Appreciate palliative medicine involvement.  Tentative plan for meeting Tuesday at 2 PM with Mr. Julian Tyler and the patient's wife.   Much more alert today.  I discussed briefly with speech therapist, there may be an opportunity to evaluate him later this afternoon.  Increase Lantus.  Continue tube feeds and antihypertensives as well as antiepileptics.  Not sure how much more improvement can be expected in the short-term, expect will be prolonged recovery.  DVT prophylaxis: enoxaparin Code Status: Full Family Communication: Discussed with patient's son Mr. Julian Tyler yesterday by telephone. Disposition Plan: uncertain    Julian Hodgkins, MD  [facility] Direct contact: see [email or web address] (further directions at bottom of note if needed) 7PM-7AM contact night coverage as at bottom of note 12/24/2018, 1:06 PM  LOS: 12 days   Consultants  . Neurology . Palliative medicine  Procedures  . EEG IMPRESSION: This is an abnormal EEG secondary to general background slowing.  This finding may be seen with a diffuse disturbance that is etiologically nonspecific, but may include a metabolic encephalopathy, among other possibilities.  Despite the patient having multiple episodes with upper extremity shaking and moaning, no epileptiform activity is noted.   EEG 7/7 Clinical Interpretation: This EEG reveals a focal area of abnormality in the left temporal region.  This is relatively nonspecific, can be seen in multiple conditions including mass lesions, stroke, neurodegenerative conditions, postictal states, among other causes.  There is also evidence of a nonspecific generalized cerebral dysfunction (encephalopathy).  There was no seizure or evidence of a seizure predisposition seen on the study.   Echo IMPRESSIONS    1. The left ventricle has normal systolic function with an ejection fraction of 60-65%. The  cavity size was normal. There is mildly increased left ventricular wall thickness. Left ventricular diastolic Doppler parameters are consistent with impaired  relaxation. No evidence of left ventricular regional wall motion abnormalities.  2. The right ventricle has normal systolic function. The cavity was normal. There is no increase in right ventricular wall thickness.  3. Trivial pericardial effusion is present.  4. Mild calcification of the mitral valve leaflet. There is mild mitral annular calcification present. No evidence of mitral valve stenosis. No significant mitral regurgitation.  5. The aortic valve is tricuspid. Moderate calcification of the aortic valve. No stenosis of the aortic valve. In the parasternal long axis view, there appears to be a small mobile mass on the atrial size of the aortic valve, possible vegetation. Poor  images on this study, cannot see aortic valve well in other views. Would suggest TEE to evaluate more closely.  6. The aortic root is normal in size and structure.  7. IVC was normal in size. PA systolic pressure 35 mmHg.  8. Technically difficult study with poor acoustic windows.  Antibiotics  .   Interval History/Subjective  Feels okay today.  No complaints.  Objective   Vitals:  Vitals:   12/24/18 0748 12/24/18 1214  BP: 136/67 (!) 144/63  Pulse: 99 96  Resp: 20 20  Temp: 99 F (37.2 C) 98.4 F (36.9 C)  SpO2: 100% 98%    Exam: Constitutional:   . Appears calm and comfortable.  Very awake and alert, watching TV, eyes open as I enter the room. ENMT:  . grossly normal hearing  Respiratory:  . CTA bilaterally, no w/r/r.  . Respiratory effort normal. Cardiovascular:  . RRR, no m/r/g . No significant LE extremity edema   Abdomen:  . Soft, nontender Musculoskeletal:  . Globally weak  but moves all extremities to command Psychiatric:  . Mental status o Mood, affect appropriate . Oriented to self, hospital   I have personally reviewed  the following:   Today's Data  . CBG remains stable although in the 200s . BMP unremarkable . Phosphorus and magnesium within normal limits  Lab Data  .   Micro Data  . Blood cultures pending  Imaging  . CT head and CT angiogram head no large vessel occlusion.  Advanced intracranial atherosclerosis . MRI brain no acute stroke, mass-effect.  Moderate to severe chronic microvascular ischemic changes and volume loss. . VQ scan very low probability . Chest x-ray no acute disease  Cardiology Data  .   Other Data  .   Scheduled Meds: . amLODipine  10 mg Per Tube Daily  . [START ON 12/25/2018] aspirin  325 mg Per Tube Daily   Or  . [START ON 12/25/2018] aspirin  300 mg Rectal Daily  . chlorhexidine  15 mL Mouth Rinse BID  . dorzolamide  1 drop Both Eyes BID  . enoxaparin (LOVENOX) injection  40 mg Subcutaneous Q24H  . feeding supplement (PRO-STAT SUGAR FREE 64)  30 mL Per Tube Daily  . hydrALAZINE  100 mg Per Tube BID  . insulin aspart  0-9 Units Subcutaneous Q4H  . [START ON 12/25/2018] insulin glargine  15 Units Subcutaneous Daily  . lacosamide  100 mg Per Tube BID  . levETIRAcetam  1,000 mg Per Tube BID  . lisinopril  20 mg Per Tube Daily  . mouth rinse  15 mL Mouth Rinse q12n4p  . metoprolol tartrate  25 mg Per Tube BID  . nicotine  21 mg Transdermal Daily  . polyethylene glycol  17 g Per Tube BID  . senna  1 tablet Per Tube QHS  . sodium chloride flush  10-40 mL Intracatheter Q12H   Continuous Infusions: . feeding supplement (JEVITY 1.5 CAL/FIBER) 1,000 mL (12/24/18 0000)    Principal Problem:   Acute metabolic encephalopathy Active Problems:   Goals of care, counseling/discussion   Severe protein-calorie malnutrition ([place])   Anemia   Palliative care by specialist   Vascular dementia without behavioral disturbance ([place])   Seizure ([place])   Dysphagia   Adult failure to thrive   Type 2 diabetes mellitus without complication ([place])   LOS: 12 days   How to  contact the [facility] Attending or Consulting provider 7A - 7P or covering provider during after hours [place], for this patient?  1. Check the care team in [facility] and look for a) attending/consulting TRH provider listed and b) the [facility] team listed 2. Log into [email or web address] and use [place]'s universal password to access. If you do not have the password, please contact the hospital operator. 3. Locate the [facility] provider you are looking for under [facility] and page to a number that you can be directly reached. 4. If you still have difficulty reaching the provider, please page the [facility] (Director on Call) for the Hospitalists listed on amion for assistance.   Per Dr. Erlinda Hong: Admitted at [facility] 5/22-6/11 He had slurry speech and speaking difficulty admitted on 11/02/18. Stroke work up ruled out acute CVA but found to right arm numbess and right facial twitching along with disorientation. Loaded with Keppra. On 11/06/18 have seizure with facial twitching, right gaze and AMS. vimpat added. EEG showed no seizure on 11/06/18. He developed delirium in the setting of underlying dementia. Psych consulted and he was put on scheduled  haldol and later changed to risperdone with ativan and haldol PRN. He was discharged with keppra and vimpat along with ASA and statin on 11/22/18 to [facility].

## 2018-12-25 ENCOUNTER — Inpatient Hospital Stay (HOSPITAL_COMMUNITY): Payer: Medicare Other

## 2018-12-25 LAB — BASIC METABOLIC PANEL
Anion gap: 8 (ref 5–15)
BUN: 11 mg/dL (ref 8–23)
CO2: 25 mmol/L (ref 22–32)
Calcium: 8.6 mg/dL — ABNORMAL LOW (ref 8.9–10.3)
Chloride: 102 mmol/L (ref 98–111)
Creatinine, Ser: 0.74 mg/dL (ref 0.61–1.24)
GFR calc Af Amer: >60 mL/min (ref >60)
GFR calc non Af Amer: >60 mL/min (ref >60)
Glucose, Bld: 255 mg/dL — ABNORMAL HIGH (ref 70–99)
Potassium: 4.3 mmol/L (ref 3.5–5.1)
Sodium: 135 mmol/L (ref 135–145)

## 2018-12-25 LAB — GLUCOSE, CAPILLARY
Glucose-Capillary: 211 mg/dL — ABNORMAL HIGH (ref 70–99)
Glucose-Capillary: 231 mg/dL — ABNORMAL HIGH (ref 70–99)
Glucose-Capillary: 234 mg/dL — ABNORMAL HIGH (ref 70–99)
Glucose-Capillary: 237 mg/dL — ABNORMAL HIGH (ref 70–99)
Glucose-Capillary: 241 mg/dL — ABNORMAL HIGH (ref 70–99)
Glucose-Capillary: 247 mg/dL — ABNORMAL HIGH (ref 70–99)
Glucose-Capillary: 258 mg/dL — ABNORMAL HIGH (ref 70–99)

## 2018-12-25 LAB — PHOSPHORUS: Phosphorus: 2.7 mg/dL (ref 2.5–4.6)

## 2018-12-25 LAB — MAGNESIUM: Magnesium: 1.8 mg/dL (ref 1.7–2.4)

## 2018-12-25 MED ORDER — INSULIN ASPART 100 UNIT/ML ~~LOC~~ SOLN: 4.0000 [IU] | SUBCUTANEOUS | Status: DC

## 2018-12-25 MED ORDER — INSULIN ASPART 100 UNIT/ML ~~LOC~~ SOLN
3.0000 [IU] | SUBCUTANEOUS | Status: DC
  Administered 2018-12-25 – 2018-12-28 (×18): 3 [IU] via SUBCUTANEOUS

## 2018-12-25 MED ORDER — LACOSAMIDE 50 MG PO TABS
50.0000 mg | ORAL_TABLET | Freq: Two times a day (BID) | ORAL | Status: DC
  Administered 2018-12-25 – 2018-12-29 (×9): 50 mg
  Filled 2018-12-25 (×10): qty 1

## 2018-12-25 MED ORDER — LEVETIRACETAM 100 MG/ML PO SOLN
500.0000 mg | Freq: Two times a day (BID) | ORAL | Status: DC
  Administered 2018-12-25 – 2018-12-29 (×9): 500 mg
  Filled 2018-12-25 (×11): qty 5

## 2018-12-25 MED ORDER — BISACODYL 10 MG RE SUPP
10.0000 mg | Freq: Once | RECTAL | Status: AC
  Administered 2018-12-25: 13:00:00 10 mg via RECTAL
  Filled 2018-12-25: qty 1

## 2018-12-25 NOTE — Plan of Care (Signed)
  Problem: Pain Managment: Goal: General experience of comfort will improve Outcome: Progressing   

## 2018-12-25 NOTE — Progress Notes (Addendum)
PROGRESS NOTE  Kline Bulthuis XTG:[id number] DOB: 02-18-1946 DOA: 12/11/2018 PCP: Milana Na., MD  Brief History   73 year old man complex past medical history including stroke with residual right-sided deficits, diabetes mellitus, dementia, recent complex hospitalization where he was treated for seizures and delirium in the setting of dementia who presented to the emergency department 6/30 as a code stroke, reported left facial droop and left arm weakness.  Stat EEG no seizure.  MRI negative.  Admitted for acute metabolic encephalopathy, hypernatremia, dehydration, acute kidney injury, possible UTI.  Seen by neurology but no specific neurologic diagnoses made.  Symptoms attributed to hypernatremia.  However the patient has not significantly improved despite correction of hypernatremia.  Hospitalization was complicated by prolonged encephalopathy which waxes and wanes.  Patient unable to swallow and has remained n.p.o.  In discussion with family, feeding tube was temporarily placed and tube feeds initiated.  Patient continues to have waxing and waning of his mental status and is at times awake and alert and follows commands at other times hardly participates with examination.  A & P  Acute metabolic encephalopathy superimposed on baseline dementia thought secondary to dehydration and hypernatremia.  MRI of the brain without acute findings and EEG without epileptiform discharges.  Seen by neurology, felt his symptoms were related to metabolic encephalopathy given sodium of 158 on admission. Also of note patient recently started on Seroquel, Xanax and Depakote for combativeness prior to admission. ABG and ammonia were unremarkable. Repeat EEG unremarkable.  MRI brain on admission was negative as were CT head, neck and CT cerebral perfusion. --Mental status continues to wax and wane.  Minimally participative with my exam but did work with physical therapy. --Etiology remains obscure and may be  simply secondary to worsening dementia.  However, seizure medications can both cause confusion and sedation and after discussion with son, will decrease doses of both agents.  I discussed in detail with the son that this could result in the patient having breakthrough seizures which could cause worsening respiratory status or even death but son wishes to proceed with this course.  I think this is reasonable to see whether his mental status improves.  Further no seizures have been identified here.  Seizure disorder --No seizures reported.  EEG was unrevealing.  Continue Keppra, Vimpat but both doses decreased.  If has recurrent seizures would increase Keppra back to 1000 mg twice daily and Vimpat 100 mg twice daily  Diabetes mellitus type 2 --Blood sugar somewhat elevated.  Continue sliding scale insulin.  Continue Lantus.  Will add NovoLog schedule coverage every 4. --If tube feeds are stopped, be sure to stop Lantus and scheduled NovoLog coverage.  Essential hypertension --Stable.  Continue metoprolol, amlodipine, lisinopril and hydralazine per tube  Vascular dementia with behavioral disturbance --Waxes and wanes.  Avoid sedatives.  Severe protein calorie malnutrition, failure to thrive, failed swallow evaluation --Family desired Cortrak placement and initiation of temporary tube feeds.Starting tube feeds 7/10. --Tolerating tube feeds.  Continue.  Borderline microcytic anemia, acute on chronic, somewhat lower today although has been between 7-8 for the last 4 days. --Stable.  Follow clinically.  Abnormal echocardiogram with possible mobile density on aortic valve.  Blood cultures no growth --Blood cultures remain no growth and patient afebrile.  Transesophageal echocardiogram deferred from 7/3 up to this point given the patient's poor clinical status --Discussed this in detail with the patient's son and he does not want to pursue any further testing  RPR and T palladium antibodies were  positive --Discussed  in detail with infectious disease Dr. Prince Rome, reviewed history and chart. This is felt to be most likely a false positive, could also represent treatment in the past.   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  Abnormal serum and protein electrophoresis --Discussed with Dr. Alvy Bimler; kappa and lambda chains were appropriately elevated in the context of renal dysfunction.  There was no M spike.  These tests are unremarkable and no further evaluation is suggested.  Acute kidney injury, hypernatremia --Resolved.  Presumably secondary to failure to thrive. --Renal function appears stable  Obesity unspecified  PMH CVA affecting the right side and slurred speech at baseline  Obesity unspecified --Per dietitian  No bowel movement recorded --Patient has not eaten probably for more than 2 weeks, was failing to thrive at previous facility.  Abdominal x-ray today is reassuring.  No retained stool seen. --Continue bowel regimen.  Suppository.  Goals of care --Discussed with son and palliative medicine Wadie Lessen in conference room in person.  We discussed various options.  Son does not want PEG tube.  This is quite reasonable.  We discussed the tube feeds are a temporary measure and likely need to be discontinued within the next few days.  Comfort feeds could be started if the risk of aspiration, pulmonary compromise and death is excepted.   We will decrease seizure medication doses to see if this has a positive effect on his mental status.  If not, patient would be a good candidate for residential hospice.  Son tells me his father remains full code.  DVT prophylaxis: enoxaparin Code Status: Full Family Communication:  Disposition Plan: uncertain    Murray Hodgkins, MD  [facility] Direct contact: see [email or web address] (further directions at bottom of note if needed) 7PM-7AM contact night coverage as at  bottom of note 12/25/2018, 6:17 PM  LOS: 13 days   Consultants  . Neurology . Palliative medicine  Procedures  . EEG IMPRESSION: This is an abnormal EEG secondary to general background slowing.  This finding may be seen with a diffuse disturbance that is etiologically nonspecific, but may include a metabolic encephalopathy, among other possibilities.  Despite the patient having multiple episodes with upper extremity shaking and moaning, no epileptiform activity is noted.   EEG 7/7 Clinical Interpretation: This EEG reveals a focal area of abnormality in the left temporal region.  This is relatively nonspecific, can be seen in multiple conditions including mass lesions, stroke, neurodegenerative conditions, postictal states, among other causes.  There is also evidence of a nonspecific generalized cerebral dysfunction (encephalopathy).  There was no seizure or evidence of a seizure predisposition seen on the study.   Echo IMPRESSIONS    1. The left ventricle has normal systolic function with an ejection fraction of 60-65%. The cavity size was normal. There is mildly increased left ventricular wall thickness. Left ventricular diastolic Doppler parameters are consistent with impaired  relaxation. No evidence of left ventricular regional wall motion abnormalities.  2. The right ventricle has normal systolic function. The cavity was normal. There is no increase in right ventricular wall thickness.  3. Trivial pericardial effusion is present.  4. Mild calcification of the mitral valve leaflet. There is mild mitral annular calcification present. No evidence of mitral valve stenosis. No significant mitral regurgitation.  5. The aortic valve is tricuspid. Moderate calcification of the aortic valve. No stenosis of the aortic valve. In the parasternal long axis view, there appears to be  a small mobile mass on the atrial size of the aortic valve, possible vegetation. Poor  images on this study, cannot  see aortic valve well in other views. Would suggest TEE to evaluate more closely.  6. The aortic root is normal in size and structure.  7. IVC was normal in size. PA systolic pressure 35 mmHg.  8. Technically difficult study with poor acoustic windows.  Antibiotics  .   Interval History/Subjective  Minimally responsive today.   However, later when son was present, the patient did work with physical therapy  Objective   Vitals:  Vitals:   12/25/18 1201 12/25/18 1601  BP: 133/67 (!) 154/67  Pulse: 91 95  Resp: 18 20  Temp: 98.7 F (37.1 C) 98.5 F (36.9 C)  SpO2: 100% 99%    Exam: Constitutional:   . Appears calm and comfortable Respiratory:  . CTA bilaterally, no w/r/r.  . Respiratory effort normal.  Cardiovascular:  . RRR, no m/r/g . No LE extremity edema   Abdomen:  . Obese, soft, mildly distended.  Nontender. Musculoskeletal:  . Did squeeze with her right hand to command but otherwise did not participate with examination Psychiatric:  . Mental status . Somnolent today.  I have personally reviewed the following:   Today's Data  . Urine output 1400.  +1.2 L since admission. . CBG stable . BMP unremarkable.  Magnesium and phosphorus within normal limits. . Abdominal x-ray shows no acute abnormality.  No definite retained fecal material noted  Lab Data  .   Micro Data  . Blood cultures pending  Imaging  . CT head and CT angiogram head no large vessel occlusion.  Advanced intracranial atherosclerosis . MRI brain no acute stroke, mass-effect.  Moderate to severe chronic microvascular ischemic changes and volume loss. . VQ scan very low probability . Chest x-ray no acute disease  Cardiology Data  .   Other Data  .   Scheduled Meds: . amLODipine  10 mg Per Tube Daily  . aspirin  325 mg Per Tube Daily   Or  . aspirin  300 mg Rectal Daily  . bisacodyl  10 mg Rectal Once  . chlorhexidine  15 mL Mouth Rinse BID  . dorzolamide  1 drop Both Eyes BID  .  enoxaparin (LOVENOX) injection  40 mg Subcutaneous Q24H  . feeding supplement (PRO-STAT SUGAR FREE 64)  30 mL Per Tube Daily  . hydrALAZINE  100 mg Per Tube BID  . insulin aspart  0-9 Units Subcutaneous Q4H  . insulin glargine  15 Units Subcutaneous Daily  . lacosamide  100 mg Per Tube BID  . levETIRAcetam  1,000 mg Per Tube BID  . lisinopril  20 mg Per Tube Daily  . mouth rinse  15 mL Mouth Rinse q12n4p  . metoprolol tartrate  25 mg Per Tube BID  . nicotine  21 mg Transdermal Daily  . polyethylene glycol  17 g Per Tube BID  . senna  1 tablet Per Tube QHS  . sodium chloride flush  10-40 mL Intracatheter Q12H   Continuous Infusions: . feeding supplement (JEVITY 1.5 CAL/FIBER) 1,000 mL (12/24/18 0000)    Principal Problem:   Acute metabolic encephalopathy Active Problems:   Goals of care, counseling/discussion   Severe protein-calorie malnutrition ([place])   Anemia   Palliative care by specialist   Vascular dementia without behavioral disturbance ([place])   Seizure ([place])   Dysphagia   Adult failure to thrive   Type 2 diabetes mellitus without complication ([place])  LOS: 13 days   How to contact the [facility] Attending or Consulting provider [place] or covering provider during after hours [place], for this patient?  1. Check the care team in [facility] and look for a) attending/consulting [facility] provider listed and b) the [facility] team listed 2. Log into [email or web address] and use [place]'s universal password to access. If you do not have the password, please contact the hospital operator. 3. Locate the [facility] provider you are looking for under [facility] and page to a number that you can be directly reached. 4. If you still have difficulty reaching the provider, please page the [facility] (Director on Call) for the Hospitalists listed on amion for assistance.   Per Dr. Erlinda Hong: Admitted at [facility] 5/22-6/11 He had slurry speech and speaking difficulty admitted on 11/02/18. Stroke work up ruled out acute  CVA but found to right arm numbess and right facial twitching along with disorientation. Loaded with Keppra. On 11/06/18 have seizure with facial twitching, right gaze and AMS. vimpat added. EEG showed no seizure on 11/06/18. He developed delirium in the setting of underlying dementia. Psych consulted and he was put on scheduled haldol and later changed to risperdone with ativan and haldol PRN. He was discharged with keppra and vimpat along with ASA and statin on 11/22/18 to [facility].  Time spent in counseling coordination of care greater than 35 minutes.  Discussion with son by telephone and in person.

## 2018-12-25 NOTE — Progress Notes (Signed)
  Speech Language Pathology Treatment: Dysphagia  Patient Details Name: Julian Tyler MRN: [id number] DOB: 28-Apr-1946 Today's Date: 12/25/2018 Time: 4650-3546 SLP Time Calculation (min) (ACUTE ONLY): 35 min  Assessment / Plan / Recommendation Clinical Impression  Patient was seen to address dysphagia goals with son arriving half way through session (had been here for [place] meeting with MD) and SLP was able to educated son on patient's progress and limitations regarding swallow function and safety with PO's secondary to his limited amount of alertness during the day. Son was not able to tell SLP how patient was doing with his swallow function while in SNF prior to this hospital admission. He stated that plan was to pull out the NG tube tomorrow and see how patient does. SLP did not get into an involved discussion at this time regarding what this would mean for patient. Patient was able to initiate swallow and clear oral cavity with spoon sips of honey thick liquids and continues with delayed initiation of swallow. Of note, when SLP was performing oral care prior to PO's, he did not have copious or thick secretions in oral cavity as he has in recent past.     HPI HPI: Pt is a 73 y.o. male, w hypertension, hyperlipidemia, dm2, h/o stroke , seizure, dementia, who presented with altered mental status. Per nurse, pt had right side deficit, not able to walk by self, ? Slight slurred speech. Pt recently had seroquel , xanax, and depakote added due to combativeness. MRI of the brain was negative. Acute metabolic encephalopathy suspect to be secondary to to dehydration and hypernatremia. Urinalysis indicated possible infection; he was started on antibiotics.       SLP Plan  Continue with current plan of care       Recommendations  Diet recommendations: NPO Medication Administration: Via alternative means                Oral Care Recommendations: Oral care QID Follow up Recommendations: Skilled  Nursing facility;24 hour supervision/assistance SLP Visit Diagnosis: Dysphagia, oral phase (R13.11) Plan: Continue with current plan of care       GO                Dannial Monarch 12/25/2018, 4:49 PM   [name], MA, [facility] Pager: [phone]

## 2018-12-25 NOTE — Plan of Care (Signed)
  Problem: Pain Managment: Goal: General experience of comfort will improve Outcome: Adequate for Discharge   

## 2018-12-25 NOTE — Progress Notes (Signed)
Inpatient Diabetes Program Recommendations  AACE/ADA: New Consensus Statement on Inpatient Glycemic Control (2015)  Target Ranges:  Prepandial:   less than 140 mg/dL      Peak postprandial:   less than 180 mg/dL (1-2 hours)      Critically ill patients:  140 - 180 mg/dL   Results for Julian Tyler, Julian Tyler (MRN [id number]) as of 12/25/2018 13:42  Ref. Range 12/24/2018 07:46 12/24/2018 12:10 12/24/2018 16:43 12/24/2018 20:04 12/25/2018 00:30 12/25/2018 04:01 12/25/2018 07:55 12/25/2018 11:56  Glucose-Capillary Latest Ref Range: 70 - 99 mg/dL 238 (H) 256 (H) 250 (H) 251 (H) 234 (H) 247 (H) 241 (H) 237 (H)    Review of Glycemic Control  Diabetes history: DM 2 Outpatient Diabetes medications:  Lantus 30 units q HS, Humalog 5 units tid, Metformin 500 mg q breakfast Current orders for Inpatient glycemic control:  Lantus 10 units Novolog sensitive q 4 hours  Inpatient Diabetes Program Recommendations:    Glucose elevated in 200's after Lantus 10 added. Patient on Tube Feeds  Consider adding Novolog 3 units q 4 hours Tube Feed Coverage (do not give if tube feeds are stopped or held).   Thanks,  Tama Headings RN, MSN, BC-ADM Inpatient Diabetes Coordinator Team Pager [phone] (8a-5p)

## 2018-12-25 NOTE — Progress Notes (Signed)
Palliative medicine progress note  73 year old man complex past medical history including stroke with residual right-sided deficits, diabetes mellitus, dementia, recent complex hospitalization where he was treated for seizures and delirium in the setting of dementia who presented to the emergency department 6/30 as a code stroke, reported left facial droop and left arm weakness.   I met today with patient's son [name] for our scheduled meeting to continue conversation regarding goals of care, treatment options and end-of-life wishes.  Patient's wife Adonis Huguenin was unable to come for the meeting.  Dr Sarajane Jews present.  There is no documented H POA therefore the wife Adonis Huguenin would be the main decision maker.  [name] tells me that Audelia Hives has empowered him to represent the family and make decisions.   Family report that 6 months ago he was totally independent, taking buses for transportation and living alone with his wife.  Today however he speaks to the patient's history of dementia.    There are discrepancies to the history given by the family   Day 14 of hospital stay   Patient remains lethargic, dependent for all ADLs.  He is able to follow simple commands but intermittently, not consistantly.     Core-trac has been  placed  and feedings are being tolerated.  He again failed a swallow evaluation in an attempt to initiate p.o. feeds.  Family has verbalized today their definitive decision for no PEG placement now or into the future  Continued conversation regarding diagnosis, prognosis, goals of care, end-of-life wishes and options.   Mr. Sharlene Motts an understanding of the patient's situation and his main wish for his father is comfort and dignity.  Although he tells me he is the main decision maker/ there is no documented HPOA but he tells me patient's wife and he work together,   he has 9 other siblings that he must consider when making decisions related to life prolonging measures.   According to [name] his other family members struggle with any kind of shift to a full comfort path.   Plan of Care  DNR/DNI-docuemented today   (later reversed when son talked with Dr Sarajane Jews)  Family has made a definitive decision for no PEG placement now or into the future.  However they wish to consider with the core track over the next 2 days in hopes that patient will show some signs of improvement.  Decision has been made to offer him ice chips and sips of plain water for comfort.  Son/Mr. Green plans to call Dr. Sarajane Jews this evening regarding a definitive time to remove the core track either this Friday or Saturday.  PMT will continue to support.   Prognosis Undetermined. Patient is high risk for aspiration and decompensation.      At this point, patient would qualify for his hospice benefit, with likely a prognosis of 6 months or less if things were to continue to decline.  If family elects a more comfort, symptom based approach, he would qualify for residential hospice  Disposition To be determined  Discussed with Dr. Sarajane Jews  Total Time:  45 minutes  Greater than 50%  of this time was spent counseling and coordinating care related to the above assessment and plan.  Wadie Lessen NP  Palliative Medicine Team Team Phone # [phone] Pager  [phone]

## 2018-12-25 NOTE — Progress Notes (Signed)
Physical Therapy Treatment Patient Details Name: Julian Tyler MRN: [id number] DOB: 23-Sep-1945 Today's Date: 12/25/2018    History of Present Illness Pt is a 73 y/o nursing home resident who presents with AMS. MRI negative, and acute metabolic encephalopathy suspected to be 2 dehydration and hypernatremia. Possible UTI as well. PMH: stroke, CKD, Dry eyes, DM, hyperlipidemia    PT Comments    Pt progressing slowly towards physical therapy goals. Family member present this session and appeared to be a motivating factor for pt. He was able to tolerate EOB activity ~15 minutes, where focus was sitting balance/trunk stabilization, therapeutic exercise, and grooming activity. Pt grossly non-verbal throughout session but answered "uh huh" to questions at times. Will continue to follow and progress as able per POC.    Follow Up Recommendations  SNF;Supervision/Assistance - 24 hour     Equipment Recommendations  None recommended by PT    Recommendations for Other Services Rehab consult     Precautions / Restrictions Precautions Precautions: None Restrictions Weight Bearing Restrictions: No    Mobility  Bed Mobility Overal bed mobility: Needs Assistance Bed Mobility: Supine to Sit;Sit to Supine     Supine to sit: Total assist;+2 for physical assistance Sit to supine: Total assist;+2 for physical assistance;+2 for safety/equipment   General bed mobility comments: Bed pad and +2 assist required for transition to/from EOB. Pt able to tolerate sitting EOB ~15 minutes with up to max assist for sitting balance and +2 at times. Pt was assisted to lean onto L elbow but had difficulty holding L lateral lean, drifting back to midline and then to R.   Transfers                 General transfer comment: Pt is appropriate for transition OOB to chair with maxi-move with nursing staff.   Ambulation/Gait                 Stairs             Wheelchair Mobility     Modified Rankin (Stroke Patients Only)       Balance Overall balance assessment: Needs assistance Sitting-balance support: No upper extremity supported;Feet supported Sitting balance-Leahy Scale: Zero Sitting balance - Comments: Max assist to maintain sitting balance.                                     Cognition Arousal/Alertness: Lethargic Behavior During Therapy: Flat affect Overall Cognitive Status: Difficult to assess                     Current Attention Level: Focused   Following Commands: Follows one step commands inconsistently;Follows one step commands with increased time   Awareness: Intellectual Problem Solving: Slow processing;Decreased initiation;Difficulty sequencing;Requires verbal cues;Requires tactile cues General Comments: Family member present and active in treatment session. Appeared to be a good motivating factor for patient.       Exercises Other Exercises Other Exercises: x5 PROM BLE LAQ in sitting Other Exercises: Grooming activity with washcloth - see OT note for more detail.     General Comments        Pertinent Vitals/Pain Pain Assessment: Faces Faces Pain Scale: Hurts little more Pain Location: Generalized moaning with position changes and transfer to/from EOB.  Pain Descriptors / Indicators: Grimacing;Moaning Pain Intervention(s): Limited activity within patient's tolerance;Monitored during session;Repositioned    Home Living  Prior Function            PT Goals (current goals can now be found in the care plan section) Acute Rehab PT Goals Patient Stated Goal: unable to state goals  PT Goal Formulation: Patient unable to participate in goal setting Time For Goal Achievement: 01/03/19 Potential to Achieve Goals: Fair Progress towards PT goals: Progressing toward goals    Frequency    Min 2X/week      PT Plan Current plan remains appropriate    Co-evaluation PT/OT/SLP  Co-Evaluation/Treatment: Yes Reason for Co-Treatment: Complexity of the patient's impairments (multi-system involvement);Necessary to address cognition/behavior during functional activity;For patient/therapist safety;To address functional/ADL transfers PT goals addressed during session: Mobility/safety with mobility;Balance;Strengthening/ROM        AM-PAC PT "6 Clicks" Mobility   Outcome Measure  Help needed turning from your back to your side while in a flat bed without using bedrails?: Total Help needed moving from lying on your back to sitting on the side of a flat bed without using bedrails?: Total Help needed moving to and from a bed to a chair (including a wheelchair)?: Total Help needed standing up from a chair using your arms (e.g., wheelchair or bedside chair)?: Total Help needed to walk in hospital room?: Total Help needed climbing 3-5 steps with a railing? : Total 6 Click Score: 6    End of Session Equipment Utilized During Treatment: Gait belt Activity Tolerance: Patient limited by lethargy Patient left: in bed;with call bell/phone within reach;with bed alarm set Nurse Communication: Mobility status PT Visit Diagnosis: Other abnormalities of gait and mobility (R26.89);Muscle weakness (generalized) (M62.81)     Time: 7711-6579 PT Time Calculation (min) (ACUTE ONLY): 26 min  Charges:  $Therapeutic Activity: 8-22 mins                     Rolinda Roan, PT, DPT Acute Rehabilitation Services Pager: [phone] Office: [phone]    Thelma Comp 12/25/2018, 2:56 PM

## 2018-12-25 NOTE — Progress Notes (Signed)
Occupational Therapy Treatment Patient Details Name: Julian Tyler MRN: [id number] DOB: February 09, 1946 Today's Date: 12/25/2018    History of present illness Pt is a 73 y/o nursing home resident who presents with AMS. MRI negative, and acute metabolic encephalopathy suspected to be 2 dehydration and hypernatremia. Possible UTI as well. PMH: stroke, CKD, Dry eyes, DM, hyperlipidemia   OT comments  Skilled co-tx with PT to address NMR, balance, functional mobility, and self care. Family member present to provide encouragement to pt and increased pt's alertness while seated on EOB. He was able to tolerate EOB activity for 15 mins to address above mention deficits. Pt continues to grunt, "uh huh" when asked questions. He was unable to verbalize name of family member in the room. Pt would continue to benefit from OT intervention to address functional deficits.     Follow Up Recommendations  SNF    Equipment Recommendations  None recommended by OT       Precautions / Restrictions Precautions Precautions: None Restrictions Weight Bearing Restrictions: No       Mobility Bed Mobility Overal bed mobility: Needs Assistance Bed Mobility: Supine to Sit;Sit to Supine     Supine to sit: Total assist;+2 for physical assistance Sit to supine: Total assist;+2 for physical assistance;+2 for safety/equipment   General bed mobility comments: Bed pad and +2 assist required for transition to/from EOB. Pt able to tolerate sitting EOB ~15 minutes with up to max assist for sitting balance and +2 at times. Pt was assisted to lean onto L elbow but had difficulty holding L lateral lean, drifting back to midline and then to R.     General transfer comment: Pt is appropriate for transition OOB to chair with maxi-move with nursing staff.     Balance Overall balance assessment: Needs assistance Sitting-balance support: No upper extremity supported;Feet supported Sitting balance-Leahy Scale: Zero Sitting  balance - Comments: Max assist to maintain sitting balance.      ADL either performed or assessed with clinical judgement   ADL       Grooming: Sitting;Moderate assistance      General ADL Comments: reaching out with L hand but placing cloth in R hand pt needing mod A to get it to face               Cognition Arousal/Alertness: Lethargic Behavior During Therapy: Flat affect Overall Cognitive Status: Difficult to assess      Current Attention Level: Focused   Following Commands: Follows one step commands inconsistently;Follows one step commands with increased time   Awareness: Intellectual Problem Solving: Slow processing;Decreased initiation;Difficulty sequencing;Requires verbal cues;Requires tactile cues General Comments: Family member present and active in treatment session. Appeared to be a good motivating factor for patient.         Exercises Exercises: Other exercises Other Exercises Other Exercises: x5 PROM BLE LAQ in sitting Other Exercises: Grooming activity with washcloth - see OT note for more detail.            Pertinent Vitals/ Pain       Pain Assessment: Faces Faces Pain Scale: Hurts little more Pain Location: Generalized moaning with position changes and transfer to/from EOB.  Pain Descriptors / Indicators: Grimacing;Moaning Pain Intervention(s): Limited activity within patient's tolerance;Monitored during session;Repositioned      Progress Toward Goals  OT Goals(current goals can now be found in the care plan section)  Progress towards OT goals: Progressing toward goals  Acute Rehab OT Goals Patient Stated Goal: unable to state goals  Plan Discharge plan remains appropriate    Co-evaluation    PT/OT/SLP Co-Evaluation/Treatment: Yes Reason for Co-Treatment: Complexity of the patient's impairments (multi-system involvement);Necessary to address cognition/behavior during functional activity;For patient/therapist safety;To address  functional/ADL transfers PT goals addressed during session: Mobility/safety with mobility;Balance;Strengthening/ROM OT goals addressed during session: ADL's and self-care;Strengthening/ROM      AM-PAC OT "6 Clicks" Daily Activity     Outcome Measure   Help from another person eating meals?: Total Help from another person taking care of personal grooming?: Total Help from another person toileting, which includes using toliet, bedpan, or urinal?: Total Help from another person bathing (including washing, rinsing, drying)?: Total Help from another person to put on and taking off regular upper body clothing?: Total Help from another person to put on and taking off regular lower body clothing?: Total      End of Session    OT Visit Diagnosis: Unsteadiness on feet (R26.81);Muscle weakness (generalized) (M62.81);Other symptoms and signs involving cognitive function;Adult, failure to thrive (R62.7)   Activity Tolerance Patient limited by fatigue;Patient limited by lethargy   Patient Left in bed;with call bell/phone within reach;with bed alarm set;with SCD's reapplied   Nurse Communication Mobility status        Time: 1403-1430 OT Time Calculation (min): 27 min  Charges: OT General Charges $OT Visit: 1 Visit OT Treatments $Neuromuscular Re-education: 8-22 mins    Ragan Duhon P, MS, OTR/L 12/25/2018, 3:21 PM

## 2018-12-26 DIAGNOSIS — N183 Chronic kidney disease, stage 3 (moderate): Secondary | ICD-10-CM

## 2018-12-26 LAB — GLUCOSE, CAPILLARY
Glucose-Capillary: 155 mg/dL — ABNORMAL HIGH (ref 70–99)
Glucose-Capillary: 161 mg/dL — ABNORMAL HIGH (ref 70–99)
Glucose-Capillary: 167 mg/dL — ABNORMAL HIGH (ref 70–99)
Glucose-Capillary: 168 mg/dL — ABNORMAL HIGH (ref 70–99)
Glucose-Capillary: 172 mg/dL — ABNORMAL HIGH (ref 70–99)
Glucose-Capillary: 172 mg/dL — ABNORMAL HIGH (ref 70–99)
Glucose-Capillary: 183 mg/dL — ABNORMAL HIGH (ref 70–99)
Glucose-Capillary: 192 mg/dL — ABNORMAL HIGH (ref 70–99)
Glucose-Capillary: 193 mg/dL — ABNORMAL HIGH (ref 70–99)

## 2018-12-26 LAB — BASIC METABOLIC PANEL
Anion gap: 9 (ref 5–15)
BUN: 11 mg/dL (ref 8–23)
CO2: 24 mmol/L (ref 22–32)
Calcium: 9 mg/dL (ref 8.9–10.3)
Chloride: 102 mmol/L (ref 98–111)
Creatinine, Ser: 0.71 mg/dL (ref 0.61–1.24)
GFR calc Af Amer: >60 mL/min (ref >60)
GFR calc non Af Amer: >60 mL/min (ref >60)
Glucose, Bld: 184 mg/dL — ABNORMAL HIGH (ref 70–99)
Potassium: 3.8 mmol/L (ref 3.5–5.1)
Sodium: 135 mmol/L (ref 135–145)

## 2018-12-26 NOTE — Plan of Care (Signed)
  Problem: Pain Managment: Goal: General experience of comfort will improve Outcome: Progressing   

## 2018-12-26 NOTE — Progress Notes (Signed)
Pt c/o pain to RLE; RLE very painful to touch or lift; foot has +2 edema to leg and foot; MD notified; MD said he will come assess. Will continue to closely monitor. Delia Heady RN

## 2018-12-26 NOTE — Progress Notes (Signed)
PROGRESS NOTE    Julian Tyler  OEV:[id number] DOB: 25-Jan-1946 DOA: 12/11/2018 PCP: Milana Na., MD   Brief Narrative:  Patient is a 73 year old man complex past medical history including stroke with residual right-sided deficits, diabetes mellitus, dementia, recent complex hospitalization where he was treated for seizures and delirium in the setting of dementia who presented to the emergency department 6/30 as a code stroke, reported left facial droop and left arm weakness.  Stat EEG did not show seizure.  MRI negative.  Admitted for acute metabolic encephalopathy, hypernatremia, dehydration, acute kidney injury, possible UTI.  Seen by neurology but no specific neurologic diagnoses made.  Symptoms attributed to hypernatremia.  However the patient has not significantly improved despite correction of hypernatremia.  Hospitalization was complicated by prolonged encephalopathy which waxes and wanes.  Patient unable to swallow and has remained n.p.o.  In discussion with family, feeding tube was temporarily placed and tube feeds initiated.  Patient continues to have waxing and waning of his mental status and is at times awake and alert and follows commands at other times hardly participates with examination   Assessment & Plan:   Principal Problem:   Acute metabolic encephalopathy Active Problems:   Goals of care, counseling/discussion   Severe protein-calorie malnutrition ([place])   Anemia   Palliative care by specialist   Vascular dementia without behavioral disturbance ([place])   Seizure ([place])   Dysphagia   Adult failure to thrive   Type 2 diabetes mellitus without complication ([place])   Acute metabolic encephalopathy: Patient has history of baseline dementia.  Acute encephalopathy thought to be secondary to dehydration/hypernatremia..  Sodium was 158 on admission.  Patient was also on multiple psych medications prior to admission.  ABG, ammonia were unremarkable.  EEG unremarkable.  MRI  of brain on admission was negative.  Mental status continues to wax and wane. Etiology remains obscure.  Acute worsening of dementia is possibility. He tries to follows commands.  But oriented only to self.  Continues to remain n.p.o.  Seizure disorder: No seizure reported.  EEG did not show any seizure.  Continue Keppra, Vimpat on reduced doses.  If seizure recurs, will increase Keppra back to 1 g twice a day and Vimpat 100 mg twice a day.  Diabetes type 2: Continue sliding-scale insulin and Lantus.  Continue monitoring CBC  Hypertension: Currently blood pressure stable.  Continue current medicines  History of vascular dementia with behavioral disturbance: Avoid sedatives.  Continue to monitor mental status  Severe protein calorie malnutrition/failure to thrive: Failed his swallowing evaluation.  Family desired cardiac placement and initiation of temporary tube feeds.  Started to feed on 7/10.  Borderline microcytic anemia: Currently stable.  Continue to follow  Abnormal echocardiogram with possible mobile density on aortic valve: Blood cultures did not show any growth.  TEE deferred due to patient's poor clinical status.  Patient's family also do not want to pursue any further testing.  Positive RPR/Treponema pallidum antibodies: Discussed with ID.  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  ID thought that this most likely false positive test. no indication for current treatment.  Abnormal serum/protein electrophoresis: No M spike. No further evaluation.  Discussed with oncology.  Acute kidney injury/hypernatremia: Resolved  History of CVA: Has weakness on the right side, slurred speech at baseline  No bowel movement: Abdomen x-ray nonreassuring.  No retained stool continue bowel regimen.  Goals of care: Multiple discussion held with son, palliative care.  Son does not want PEG tube.  Plan is to continue  tube feeds for a few more days and anticipate improvement in the mental status.  If not,  patient is a candidate for hospice.         Nutrition Problem: Inadequate oral intake Etiology: inability to eat      DVT prophylaxis: Lovenox Code Status: Full Family Communication: None Disposition Plan: Undetermined at this point.  Awaiting improvement in the mental status.  Still on tube feeds.   Consultants: Neurology, palliative medicine  Procedures: Echocardiogram, EEG  Antimicrobials:  Anti-infectives (From admission, onward)   Start     Dose/Rate Route Frequency Ordered Stop   12/12/18 2100  cefTRIAXone (ROCEPHIN) 1 g in sodium chloride 0.9 % 100 mL IVPB  Status:  Discontinued     1 g 200 mL/hr over 30 Minutes Intravenous Every 24 hours 12/12/18 2052 12/15/18 1932      Subjective:  Patient seen and examined the bedside this morning.  Hemodynamically stable.  Looks comfortable.  Tries to follow the commands.  Very weak, debilitated.  Not oriented.  Opens eyes when calling his name.  Tries to say his home address when asked.  Objective: Vitals:   12/25/18 2035 12/26/18 0000 12/26/18 0413 12/26/18 0825  BP: (!) 132/98 (!) 144/66 (!) 148/74 137/67  Pulse: (!) 110 87 99 96  Resp: 20 20 17  (!) 24  Temp: 98.1 F (36.7 C) 98.9 F (37.2 C) (!) 100.5 F (38.1 C) 98.7 F (37.1 C)  TempSrc: Oral Oral Oral Oral  SpO2: 100% 99% 100% 98%  Weight:        Intake/Output Summary (Last 24 hours) at 12/26/2018 0855 Last data filed at 12/25/2018 1814 Gross per 24 hour  Intake -  Output 900 ml  Net -900 ml   Filed Weights   12/22/18 0436 12/24/18 0500 12/25/18 0500  Weight: 128.4 kg 100.7 kg 104.8 kg    Examination:  General exam: Debilitated, obese  HEENT:Oral mucosa moist, Ear/Nose normal on gross exam,feeding tube Respiratory system: Bilateral equal air entry, normal vesicular breath sounds, no wheezes or crackles when auscultated from anterior chest Cardiovascular system: S1 & S2 heard, RRR. No JVD, murmurs, rubs, gallops or clicks. Trace edema on the  extremities Gastrointestinal system: Abdomen is mildly distended, soft and nontender. No organomegaly or masses felt. Normal bowel sounds heard. Central nervous system: Not Alert and oriented.  Follows commands by squeezing the finger and opening the eyes Extremities: Trace edema, no clubbing ,no cyanosis Skin: No rashes, lesions or ulcers,no icterus ,no pallor     Data Reviewed: I have personally reviewed following labs and imaging studies  CBC: Recent Labs  Lab 12/20/18 0355  WBC 10.7*  HGB 7.5*  HCT 25.7*  MCV 79.3*  PLT 740   Basic Metabolic Panel: Recent Labs  Lab 12/22/18 0806 12/23/18 0431 12/24/18 0500 12/25/18 0500 12/26/18 0440  NA 135 133* 132* 135 135  K 3.9 4.2 4.9 4.3 3.8  CL 107 103 100 102 102  CO2 23 22 24 25 24   GLUCOSE 254* 214* 252* 255* 184*  BUN 7* 8 11 11 11   CREATININE 0.70 0.69 0.68 0.74 0.71  CALCIUM 8.4* 8.7* 8.5* 8.6* 9.0  MG 1.6* 1.7 1.7 1.8  --   PHOS 2.0* 2.3* 2.7 2.7  --    GFR: Estimated Creatinine Clearance: 98 mL/min (by C-G formula based on SCr of 0.71 mg/dL). Liver Function Tests: No results for input(s): AST, ALT, ALKPHOS, BILITOT, PROT, ALBUMIN in the last 168 hours. No results for input(s): LIPASE, AMYLASE in  the last 168 hours. No results for input(s): AMMONIA in the last 168 hours. Coagulation Profile: No results for input(s): INR, PROTIME in the last 168 hours. Cardiac Enzymes: No results for input(s): CKTOTAL, CKMB, CKMBINDEX, TROPONINI in the last 168 hours. BNP (last 3 results) No results for input(s): PROBNP in the last 8760 hours. HbA1C: No results for input(s): HGBA1C in the last 72 hours. CBG: Recent Labs  Lab 12/25/18 2359 12/26/18 0225 12/26/18 0409 12/26/18 0625 12/26/18 0821  GLUCAP 193* 172* 167* 172* 161*   Lipid Profile: No results for input(s): CHOL, HDL, LDLCALC, TRIG, CHOLHDL, LDLDIRECT in the last 72 hours. Thyroid Function Tests: No results for input(s): TSH, T4TOTAL, FREET4, T3FREE,  THYROIDAB in the last 72 hours. Anemia Panel: No results for input(s): VITAMINB12, FOLATE, FERRITIN, TIBC, IRON, RETICCTPCT in the last 72 hours. Sepsis Labs: No results for input(s): PROCALCITON, LATICACIDVEN in the last 168 hours.  Recent Results (from the past 240 hour(s))  Culture, blood (Routine X 2) w Reflex to ID Panel     Status: None   Collection Time: 12/19/18  2:32 PM   Specimen: BLOOD RIGHT HAND  Result Value Ref Range Status   Specimen Description BLOOD RIGHT HAND  Final   Special Requests   Final    BOTTLES DRAWN AEROBIC ONLY Blood Culture adequate volume   Culture   Final    NO GROWTH 5 DAYS Performed at [facility], [address] [place], [place] [address]    Report Status 12/24/2018 FINAL  Final  Culture, blood (Routine X 2) w Reflex to ID Panel     Status: None   Collection Time: 12/19/18  2:37 PM   Specimen: BLOOD LEFT HAND  Result Value Ref Range Status   Specimen Description BLOOD LEFT HAND  Final   Special Requests   Final    BOTTLES DRAWN AEROBIC ONLY Blood Culture adequate volume   Culture   Final    NO GROWTH 5 DAYS Performed at [facility], [place] [address], [place], [place] [address]    Report Status 12/24/2018 FINAL  Final         Radiology Studies: Dg Abd Portable 1v  Result Date: 12/25/2018 CLINICAL DATA:  Constipation EXAM: PORTABLE ABDOMEN - 1 VIEW COMPARISON:  11/04/2018 FINDINGS: None scattered large and small bowel gas is noted. Feeding catheter is noted within the distal stomach. No free air is seen. No definitive retained fecal material to suggest constipation is noted IMPRESSION: No acute abnormality noted. Electronically Signed   By: Inez Catalina M.D.   On: 12/25/2018 12:46        Scheduled Meds: . amLODipine  10 mg Per Tube Daily  . aspirin  325 mg Per Tube Daily   Or  . aspirin  300 mg Rectal Daily  . chlorhexidine  15 mL Mouth Rinse BID  . dorzolamide  1 drop Both Eyes BID  . enoxaparin (LOVENOX) injection   40 mg Subcutaneous Q24H  . feeding supplement (PRO-STAT SUGAR FREE 64)  30 mL Per Tube Daily  . hydrALAZINE  100 mg Per Tube BID  . insulin aspart  0-9 Units Subcutaneous Q4H  . insulin aspart  3 Units Subcutaneous Q4H  . insulin glargine  15 Units Subcutaneous Daily  . lacosamide  50 mg Per Tube BID  . levETIRAcetam  500 mg Per Tube BID  . lisinopril  20 mg Per Tube Daily  . mouth rinse  15 mL Mouth Rinse q12n4p  .  metoprolol tartrate  25 mg Per Tube BID  . nicotine  21 mg Transdermal Daily  . polyethylene glycol  17 g Per Tube BID  . senna  1 tablet Per Tube QHS  . sodium chloride flush  10-40 mL Intracatheter Q12H   Continuous Infusions: . feeding supplement (JEVITY 1.5 CAL/FIBER) 1,000 mL (12/25/18 2111)     LOS: 14 days    Time spent: 35 mins.More than 50% of that time was spent in counseling and/or coordination of care.      Shelly Coss, MD [facility] Pager [phone]  If 7PM-7AM, please contact night-coverage [email or web address] Password [email or web address] 12/26/2018, 8:55 AM

## 2018-12-26 NOTE — Progress Notes (Signed)
  Speech Language Pathology Treatment: Dysphagia  Patient Details Name: Julian Tyler MRN: [id number] DOB: [birth year] Today's Date: 12/26/2018 Time: 1035-1050 SLP Time Calculation (min) (ACUTE ONLY): 15 min  Assessment / Plan / Recommendation Clinical Impression  Patient seen to address dysphagia goals with trials of cup and straw sips of water. He was able to manage straw sips better than cup secondary to issues with labial seal on cup. He did not exhibit any overt s/s of aspiration or penetration, but continues with laryngeal pumping, multiple swallows and delay in swallow initiation and silent aspiration cannot be ruled out. Of note, session was in late morning and patient is very alert which has not be his norm in recent days. (typically only alert in PM). Palliative NP called SLP and spoke regarding GOC and change that was made in his NPO status which was to allow ice chips and sips of water and plan of removal of Cortrak feeding tube after weekend.    HPI HPI: Pt is a 73 y.o. male, w hypertension, hyperlipidemia, dm2, h/o stroke , seizure, dementia, who presented with altered mental status. Per nurse, pt had right side deficit, not able to walk by self, ? Slight slurred speech. Pt recently had seroquel , xanax, and depakote added due to combativeness. MRI of the brain was negative. Acute metabolic encephalopathy suspect to be secondary to to dehydration and hypernatremia. Urinalysis indicated possible infection; he was started on antibiotics.       SLP Plan  Continue with current plan of care       Recommendations  Diet recommendations: NPO Medication Administration: Via alternative means                Oral Care Recommendations: Oral care QID Follow up Recommendations: Skilled Nursing facility;24 hour supervision/assistance SLP Visit Diagnosis: Dysphagia, oral phase (R13.11) Plan: Continue with current plan of care       GO                Julian Tyler 12/26/2018, 4:59 PM    [name], MA, CCC-SLP Speech Therapy [facility] Acute Rehab Pager: [phone]

## 2018-12-27 LAB — BASIC METABOLIC PANEL
Anion gap: 10 (ref 5–15)
BUN: 13 mg/dL (ref 8–23)
CO2: 22 mmol/L (ref 22–32)
Calcium: 9.1 mg/dL (ref 8.9–10.3)
Chloride: 103 mmol/L (ref 98–111)
Creatinine, Ser: 0.77 mg/dL (ref 0.61–1.24)
GFR calc Af Amer: >60 mL/min (ref >60)
GFR calc non Af Amer: >60 mL/min (ref >60)
Glucose, Bld: 200 mg/dL — ABNORMAL HIGH (ref 70–99)
Potassium: 4 mmol/L (ref 3.5–5.1)
Sodium: 135 mmol/L (ref 135–145)

## 2018-12-27 LAB — CBC WITH DIFFERENTIAL/PLATELET
Abs Immature Granulocytes: 0.1 10*3/uL — ABNORMAL HIGH (ref 0.00–0.07)
Basophils Absolute: 0.1 10*3/uL (ref 0.0–0.1)
Basophils Relative: 0 %
Eosinophils Absolute: 0.2 10*3/uL (ref 0.0–0.5)
Eosinophils Relative: 2 %
HCT: 27.9 % — ABNORMAL LOW (ref 39.0–52.0)
Hemoglobin: 8.2 g/dL — ABNORMAL LOW (ref 13.0–17.0)
Immature Granulocytes: 1 %
Lymphocytes Relative: 14 %
Lymphs Abs: 1.9 10*3/uL (ref 0.7–4.0)
MCH: 23 pg — ABNORMAL LOW (ref 26.0–34.0)
MCHC: 29.4 g/dL — ABNORMAL LOW (ref 30.0–36.0)
MCV: 78.4 fL — ABNORMAL LOW (ref 80.0–100.0)
Monocytes Absolute: 0.8 10*3/uL (ref 0.1–1.0)
Monocytes Relative: 6 %
Neutro Abs: 10.6 10*3/uL — ABNORMAL HIGH (ref 1.7–7.7)
Neutrophils Relative %: 77 %
Platelets: 388 10*3/uL (ref 150–400)
RBC: 3.56 MIL/uL — ABNORMAL LOW (ref 4.22–5.81)
RDW: 16.6 % — ABNORMAL HIGH (ref 11.5–15.5)
WBC: 13.6 10*3/uL — ABNORMAL HIGH (ref 4.0–10.5)
nRBC: 0.2 % (ref 0.0–0.2)

## 2018-12-27 LAB — GLUCOSE, CAPILLARY
Glucose-Capillary: 146 mg/dL — ABNORMAL HIGH (ref 70–99)
Glucose-Capillary: 165 mg/dL — ABNORMAL HIGH (ref 70–99)
Glucose-Capillary: 170 mg/dL — ABNORMAL HIGH (ref 70–99)
Glucose-Capillary: 183 mg/dL — ABNORMAL HIGH (ref 70–99)
Glucose-Capillary: 189 mg/dL — ABNORMAL HIGH (ref 70–99)
Glucose-Capillary: 195 mg/dL — ABNORMAL HIGH (ref 70–99)
Glucose-Capillary: 196 mg/dL — ABNORMAL HIGH (ref 70–99)
Glucose-Capillary: 202 mg/dL — ABNORMAL HIGH (ref 70–99)

## 2018-12-27 NOTE — Progress Notes (Signed)
PROGRESS NOTE    Julian Tyler  QQI:[id number] DOB: 03-26-1946 DOA: 12/11/2018 PCP: Milana Na., MD   Brief Narrative:  Patient is a 73 year old man complex past medical history including stroke with residual right-sided deficits, diabetes mellitus, dementia, recent complex hospitalization where he was treated for seizures and delirium in the setting of dementia who presented to the emergency department 6/30 as a code stroke, reported left facial droop and left arm weakness.  Stat EEG did not show seizure.  MRI negative.  Admitted for acute metabolic encephalopathy, hypernatremia, dehydration, acute kidney injury, possible UTI.  Seen by neurology but no specific neurologic diagnoses made.  Symptoms attributed to hypernatremia.  However the patient has not significantly improved despite correction of hypernatremia.  Hospitalization was complicated by prolonged encephalopathy which waxes and wanes.  Patient unable to swallow and has remained n.p.o.  In discussion with family, feeding tube was temporarily placed and tube feeds initiated.  Patient continues to have waxing and waning of his mental status and is at times awake and alert and follows commands at other times hardly participates with examination   Assessment & Plan:   Principal Problem:   Acute metabolic encephalopathy Active Problems:   Goals of care, counseling/discussion   Severe protein-calorie malnutrition ([place])   Anemia   Palliative care by specialist   Vascular dementia without behavioral disturbance ([place])   Seizure ([place])   Dysphagia   Adult failure to thrive   Type 2 diabetes mellitus without complication ([place])   Acute metabolic encephalopathy: Patient has history of baseline dementia.  Acute encephalopathy thought to be secondary to dehydration/hypernatremia. Sodium was 158 on admission.  Patient was also on multiple psych medications prior to admission.  ABG, ammonia were unremarkable.  EEG unremarkable.  MRI  of brain on admission was negative.  Mental status continues to wax and wane. Etiology remains obscure.  Acute worsening of dementia is possibility. He tries to follows commands.  But oriented only to self.  Continues to remain n.p.o.  Seizure disorder: No seizure reported.  EEG did not show any seizure.  Continue Keppra, Vimpat on reduced doses.  If seizure recurs, will increase Keppra back to 1 g twice a day and Vimpat 100 mg twice a day.  Diabetes type 2: Continue sliding-scale insulin and Lantus.  Continue monitoring CBC  Hypertension: Currently blood pressure stable.  Continue current medicines  History of vascular dementia with behavioral disturbance: Avoid sedatives.  Continue to monitor mental status  Severe protein calorie malnutrition/failure to thrive: Failed his swallowing evaluation.  Family desired feeding tube placement and initiation of temporary tube feeds.  Started to feed on 7/10. Now the family wants the tube feeds to be out.  Borderline microcytic anemia: Currently stable.  Continue to follow  Abnormal echocardiogram with possible mobile density on aortic valve: Blood cultures did not show any growth.  TEE deferred due to patient's poor clinical status.  Patient's family also do not want to pursue any further testing.  Positive RPR/Treponema pallidum antibodies: Discussed with ID.  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  ID thought that this most likely false positive test. no indication for current treatment.  Abnormal serum/protein electrophoresis: No M spike. No further evaluation.  Discussed with oncology.  Acute kidney injury/hypernatremia: Resolved  History of CVA: Has weakness on the right side, slurred speech at baseline  No bowel movement: Abdomen x-ray nonreassuring.  No retained stool, continue bowel regimen.Had a bowel movement last night.  Goals of care: Multiple discussion held with  son, palliative care.  Son does not want PEG tube.  Plan is to continue tube  feed till tomorrow anticipate improvement in the mental status.Alowing  ice chips and sips of water .  Palliative care had extensive discussion with the wife and son today.  They expressed their desire to remove the tube feeding tomorrow but continue to insist on keeping him full code which does not make sense.       Nutrition Problem: Inadequate oral intake Etiology: inability to eat      DVT prophylaxis: Lovenox Code Status: Full Family Communication: None Disposition Plan: Undetermined at this point.  Ongoing discussion with family on goals of care. Hospice candidate.  Consultants: Neurology, palliative medicine  Procedures: Echocardiogram, EEG  Antimicrobials:  Anti-infectives (From admission, onward)   Start     Dose/Rate Route Frequency Ordered Stop   12/12/18 2100  cefTRIAXone (ROCEPHIN) 1 g in sodium chloride 0.9 % 100 mL IVPB  Status:  Discontinued     1 g 200 mL/hr over 30 Minutes Intravenous Every 24 hours 12/12/18 2052 12/15/18 1932      Subjective:  Patient seen and examined the bedside this morning.  Hemodynamically stable.  Continues to remain lethargic, almost unresponsive on the bed.  Opens his eyes when calling his name loud or rubbing his chest.  Objective: Vitals:   12/27/18 0011 12/27/18 0409 12/27/18 0751 12/27/18 1205  BP: (!) 164/80 (!) 128/53 (!) 142/61 120/67  Pulse: 97 (!) 107 (!) 105 92  Resp: 20 20 19 19   Temp: 99.3 F (37.4 C) 99.7 F (37.6 C) 98.2 F (36.8 C) 99.6 F (37.6 C)  TempSrc: Oral Axillary Oral Oral  SpO2: 100% 99% 98% 100%  Weight:  118.8 kg      Intake/Output Summary (Last 24 hours) at 12/27/2018 1259 Last data filed at 12/27/2018 0411 Gross per 24 hour  Intake 5364 ml  Output 1200 ml  Net 4164 ml   Filed Weights   12/24/18 0500 12/25/18 0500 12/27/18 0409  Weight: 100.7 kg 104.8 kg 118.8 kg    Examination:   General exam: Extremely debilitated, obese  HEENT: Ear/Nose normal on gross exam Respiratory system:   no wheezes or crackles auscultated on anterior chest Cardiovascular system: S1 & S2 heard, RRR. No JVD, murmurs, rubs, gallops or clicks.  Less edema on the extremities Gastrointestinal system: Abdomen is distended, soft and nontender. No organomegaly or masses felt. Normal bowel sounds heard. Central nervous system: Not alert and oriented. No focal neurological deficits.  Follows commands by squeezing the finger after opening the eyes Extremities: Trace edema, no clubbing ,no cyanosis, distal peripheral pulses palpable. Skin: Pressure ulcers on bilateral feet    Data Reviewed: I have personally reviewed following labs and imaging studies  CBC: Recent Labs  Lab 12/27/18 0423  WBC 13.6*  NEUTROABS 10.6*  HGB 8.2*  HCT 27.9*  MCV 78.4*  PLT 179   Basic Metabolic Panel: Recent Labs  Lab 12/22/18 0806 12/23/18 0431 12/24/18 0500 12/25/18 0500 12/26/18 0440 12/27/18 0423  NA 135 133* 132* 135 135 135  K 3.9 4.2 4.9 4.3 3.8 4.0  CL 107 103 100 102 102 103  CO2 23 22 24 25 24 22   GLUCOSE 254* 214* 252* 255* 184* 200*  BUN 7* 8 11 11 11 13   CREATININE 0.70 0.69 0.68 0.74 0.71 0.77  CALCIUM 8.4* 8.7* 8.5* 8.6* 9.0 9.1  MG 1.6* 1.7 1.7 1.8  --   --   PHOS 2.0* 2.3* 2.7  2.7  --   --    GFR: Estimated Creatinine Clearance: 104.6 mL/min (by C-G formula based on SCr of 0.77 mg/dL). Liver Function Tests: No results for input(s): AST, ALT, ALKPHOS, BILITOT, PROT, ALBUMIN in the last 168 hours. No results for input(s): LIPASE, AMYLASE in the last 168 hours. No results for input(s): AMMONIA in the last 168 hours. Coagulation Profile: No results for input(s): INR, PROTIME in the last 168 hours. Cardiac Enzymes: No results for input(s): CKTOTAL, CKMB, CKMBINDEX, TROPONINI in the last 168 hours. BNP (last 3 results) No results for input(s): PROBNP in the last 8760 hours. HbA1C: No results for input(s): HGBA1C in the last 72 hours. CBG: Recent Labs  Lab 12/27/18 0254 12/27/18  0406 12/27/18 0550 12/27/18 0747 12/27/18 1207  GLUCAP 202* 195* 183* 189* 146*   Lipid Profile: No results for input(s): CHOL, HDL, LDLCALC, TRIG, CHOLHDL, LDLDIRECT in the last 72 hours. Thyroid Function Tests: No results for input(s): TSH, T4TOTAL, FREET4, T3FREE, THYROIDAB in the last 72 hours. Anemia Panel: No results for input(s): VITAMINB12, FOLATE, FERRITIN, TIBC, IRON, RETICCTPCT in the last 72 hours. Sepsis Labs: No results for input(s): PROCALCITON, LATICACIDVEN in the last 168 hours.  Recent Results (from the past 240 hour(s))  Culture, blood (Routine X 2) w Reflex to ID Panel     Status: None   Collection Time: 12/19/18  2:32 PM   Specimen: BLOOD RIGHT HAND  Result Value Ref Range Status   Specimen Description BLOOD RIGHT HAND  Final   Special Requests   Final    BOTTLES DRAWN AEROBIC ONLY Blood Culture adequate volume   Culture   Final    NO GROWTH 5 DAYS Performed at [facility], [address], [place], [place] [address]    Report Status 12/24/2018 FINAL  Final  Culture, blood (Routine X 2) w Reflex to ID Panel     Status: None   Collection Time: 12/19/18  2:37 PM   Specimen: BLOOD LEFT HAND  Result Value Ref Range Status   Specimen Description BLOOD LEFT HAND  Final   Special Requests   Final    BOTTLES DRAWN AEROBIC ONLY Blood Culture adequate volume   Culture   Final    NO GROWTH 5 DAYS Performed at [facility], [place] [address], [place],  [address]    Report Status 12/24/2018 FINAL  Final         Radiology Studies: No results found.      Scheduled Meds: . amLODipine  10 mg Per Tube Daily  . aspirin  325 mg Per Tube Daily   Or  . aspirin  300 mg Rectal Daily  . chlorhexidine  15 mL Mouth Rinse BID  . dorzolamide  1 drop Both Eyes BID  . enoxaparin (LOVENOX) injection  40 mg Subcutaneous Q24H  . feeding supplement (PRO-STAT SUGAR FREE 64)  30 mL Per Tube Daily  . hydrALAZINE  100 mg Per Tube BID  . insulin aspart   0-9 Units Subcutaneous Q4H  . insulin aspart  3 Units Subcutaneous Q4H  . insulin glargine  15 Units Subcutaneous Daily  . lacosamide  50 mg Per Tube BID  . levETIRAcetam  500 mg Per Tube BID  . lisinopril  20 mg Per Tube Daily  . mouth rinse  15 mL Mouth Rinse q12n4p  . metoprolol tartrate  25 mg Per Tube BID  . nicotine  21 mg Transdermal Daily  . polyethylene glycol  17 g  Per Tube BID  . senna  1 tablet Per Tube QHS  . sodium chloride flush  10-40 mL Intracatheter Q12H   Continuous Infusions: . feeding supplement (JEVITY 1.5 CAL/FIBER) 1,000 mL (12/25/18 2111)     LOS: 15 days    Time spent: 35 mins.More than 50% of that time was spent in counseling and/or coordination of care.      Shelly Coss, MD [facility] Pager [phone]  If 7PM-7AM, please contact night-coverage [email or web address] Password [facility] 12/27/2018, 12:59 PM

## 2018-12-27 NOTE — Plan of Care (Signed)
  Problem: Activity: Goal: Risk for activity intolerance will decrease Outcome: Progressing   Problem: Coping: Goal: Level of anxiety will decrease Outcome: Progressing   Problem: Pain Managment: Goal: General experience of comfort will improve Outcome: Progressing   Problem: Education: Goal: Knowledge of disease or condition will improve Outcome: Progressing Goal: Knowledge of secondary prevention will improve Outcome: Progressing Goal: Knowledge of patient specific risk factors addressed and post discharge goals established will improve Outcome: Progressing   Problem: Nutrition: Goal: Risk of aspiration will decrease Outcome: Progressing Goal: Dietary intake will improve Outcome: Progressing   Ival Bible, BSN, RN

## 2018-12-27 NOTE — Progress Notes (Signed)
Nutrition Follow-up   RD working remotely.  DOCUMENTATION CODES:   Obesity unspecified  INTERVENTION:  Continue Jevity 1.5 formula via Cortrak NGT at goal rate of 66m/hr with 386mProstat once daily.   Tube feeding regimen provides1900kcal (100% of needs),90grams of protein, and 91213mf H2O.   Plans to discontinue Cortrak NGT/tube feeding tomorrow 7/17 and provide comfort feeds.  NUTRITION DIAGNOSIS:   Inadequate oral intake related to inability to eat as evidenced by NPO status; ongoing  GOAL:   Patient will meet greater than or equal to 90% of their needs; met with TF  MONITOR:   PO intake, Diet advancement, TF tolerance, Weight trends, Labs, I & O's  REASON FOR ASSESSMENT:   NPO/Clear Liquid Diet, Low Braden    ASSESSMENT:   73 61ar old male with a history of previous stroke, hypertension, diabetes, seizure disorder, dementia who is a resident of a skilled nursing facility, was brought to the hospital with worsening mental status.  Found to be significantly dehydrated, hypernatremic, with acute kidney injury.  Pt continues to be NPO and lethargic. Family has decided on no PEG placement with plans to discontinue Cortrak NGT and tube feeding tomorrow. Pt to be transitioned to comfort feeds as tolerated. Family aware of known risk of aspiration. Pt remains full code per family discussion of care.   Labs and medications reviewed.   Diet Order:   Diet Order    None      EDUCATION NEEDS:   Not appropriate for education at this time  Skin:  Skin Assessment: Skin Integrity Issues: Skin Integrity Issues:: DTI DTI: R heel  Last BM:  7/16  Height:   Ht Readings from Last 1 Encounters:  12/11/18 _0  (1.727 m)    Weight:   Wt Readings from Last 1 Encounters:  12/27/18 118.8 kg    Ideal Body Weight:  70 kg  BMI:  Body mass index is 39.84 kg/m.  Estimated Nutritional Needs:   Kcal:  1900-2100  Protein:  90-110 grams  Fluid:  1.9 - 2.1  L/day    SteCorrin ParkerS, RD, LDN Pager # [phone] hours/ weekend pager # [phone]

## 2018-12-27 NOTE — Progress Notes (Signed)
Palliative medicine progress note  73 year old man complex past medical history including stroke with residual right-sided deficits, diabetes mellitus, dementia, recent complex hospitalization where he was treated for seizures and delirium in the setting of dementia who presented to the emergency department 6/30 as a code stroke, reported left facial droop and left arm weakness.   Continued physical, functional and cognitive decline in spite of medical interventions  Day 15 of hospital stay.   Currently patient is lethargic and speech is brief and garbled.  Unable to follow commands.   Core-trac feedings are continuing and  being tolerated. At family request trials of sips and chips was initiated yesterday.  Family was hopeful that he would tolerate some p.o.'s. Patient remains high risk for aspiration and it is unlikely he will be able to support himself nutritionally or from a hydration standpoint.  There is no documented H POA therefore the wife Julian Tyler would be the main decision maker.  Julian Tyler tells me that Julian Tyler has empowered him to represent the family and make decisions.   I was able to speak to both wife/Julian Tyler and son/Julian Tyler on the phone together this morning.  The patient's wife Julian Tyler clearly verbalizes that she gives permission and fully supports any decision made by the patient's son Julian Tyler.    Son/Julian Tyler/ has verbalized again  today the definitive decision for no PEG placement now or into the future. Decision to discontinue core track tomorrow/Friday, July 17 as discussed with Dr. Sarajane Jews a few days ago stillstands.  Continued conversation regarding the patient's multiple comorbidities and the fact that he is failing to thrive and the regardless of medical interventions the patient's prognosis is likely weeks to months.  He is hospice eligible.  Without continued tube feeds prognosis is likely weeks.  Actually he would be residential hospice  eligible.  Son adamantly refuses residential hospice.  Although the decision has been made to discontinue artificial feeding via core track the family is still requesting that other medical interventions continue to treat the treatable; labs IV antibiotics and medications as able.  Attempted to help family understand the contraindication of this request.  Emotional support offered    Plan of Care  Full code  D/C core-trac tomorrow and provide comfort feeds as tolerated with known risk of aspiration  Family has made a definitive decision for no PEG placement now or into the future.   Continue other supportive treatments    PMT will continue to support.   Prognosis Undetermined. Patient is high risk for aspiration and decompensation.      Prognosis is likely weeks   Disposition To be determined  Discussed with Dr. Tawanna Solo  This nurse practitioner informed  the patient/family and the attending that I will be out of the hospital until Monday morning.  If the patient is still hospitalized I will follow-up at that time.  Call palliative medicine team phone # [phone] with questions or concerns.  Total Time:  45 minutes  Greater than 50%  of this time was spent counseling and coordinating care related to the above assessment and plan.  Julian Lessen NP  Palliative Medicine Team Team Phone # [phone] Pager  [phone]

## 2018-12-28 ENCOUNTER — Inpatient Hospital Stay (HOSPITAL_COMMUNITY): Payer: Medicare Other

## 2018-12-28 LAB — GLUCOSE, CAPILLARY
Glucose-Capillary: 122 mg/dL — ABNORMAL HIGH (ref 70–99)
Glucose-Capillary: 128 mg/dL — ABNORMAL HIGH (ref 70–99)
Glucose-Capillary: 129 mg/dL — ABNORMAL HIGH (ref 70–99)
Glucose-Capillary: 133 mg/dL — ABNORMAL HIGH (ref 70–99)
Glucose-Capillary: 158 mg/dL — ABNORMAL HIGH (ref 70–99)
Glucose-Capillary: 167 mg/dL — ABNORMAL HIGH (ref 70–99)
Glucose-Capillary: 182 mg/dL — ABNORMAL HIGH (ref 70–99)

## 2018-12-28 MED ORDER — OXYCODONE HCL 5 MG PO TABS
5.0000 mg | ORAL_TABLET | Freq: Four times a day (QID) | ORAL | Status: DC | PRN
  Administered 2019-01-02 – 2019-01-06 (×5): 5 mg via ORAL
  Filled 2018-12-28 (×5): qty 1

## 2018-12-28 MED ORDER — OXYCODONE-ACETAMINOPHEN 5-325 MG PO TABS
1.0000 | ORAL_TABLET | Freq: Four times a day (QID) | ORAL | Status: DC | PRN
  Administered 2019-01-02 – 2019-01-05 (×5): 1 via ORAL
  Filled 2018-12-28 (×5): qty 1

## 2018-12-28 NOTE — Progress Notes (Signed)
Cortrak tube clogged. Unable to clear after multiple attempts. MD aware and requests NG to be placed at bedside. Cortrak replacement order also added for the team to see patient Monday if NG attempts not successful. Wendee Copp

## 2018-12-28 NOTE — Progress Notes (Signed)
As per the discussion with the patient's son today,he wants to continue the tube feeding for now. We will again follow up on Monday.  Son adamant on keeping his father full code saying that that was father's wish. If patient continues on tube feeding then we will consider about placing the patient on LTACH.

## 2018-12-28 NOTE — Progress Notes (Signed)
Occupational Therapy Treatment Patient Details Name: Julian Tyler MRN: [id number] DOB: 02/04/1946 Today's Date: 12/28/2018    History of present illness Pt is a 73 y/o nursing home resident who presents with AMS. MRI negative, and acute metabolic encephalopathy suspected to be 2 dehydration and hypernatremia. Possible UTI as well. PMH: stroke, CKD, Dry eyes, DM, hyperlipidemia   OT comments  Pt had a great session with PT/OT co treat. Pt still requiring heavy +2 assistance for bed mobility but demonstrated improvement in alertness, static sitting balance, and functional activity tolerance. Observed visual deficits in L lateral visual field. Pt requiring multimodal cueing for upright posture, midline orientation, and maintaining arousal. SNF remains appropriate d/c.    Follow Up Recommendations  SNF    Equipment Recommendations  None recommended by OT       Precautions / Restrictions Precautions Precautions: None Restrictions Weight Bearing Restrictions: No       Mobility Bed Mobility Overal bed mobility: Needs Assistance Bed Mobility: Supine to Sit;Sit to Supine     Supine to sit: Total assist;+2 for physical assistance Sit to supine: Total assist;+2 for physical assistance;+2 for safety/equipment   General bed mobility comments: Bed pad and +2 assist required for transition to/from EOB. Pt able to tolerate sitting EOB ~15 minutes with up to max assist for sitting balance however was able to sit min guard assist initially.  Transfers                 General transfer comment: Pt is appropriate for transition OOB to chair with maxi-move with nursing staff.     Balance Overall balance assessment: Needs assistance Sitting-balance support: No upper extremity supported;Feet supported Sitting balance-Leahy Scale: Poor Sitting balance - Comments: Max assist to maintain sitting balance.                                    ADL either performed or assessed  with clinical judgement   ADL Overall ADL's : Needs assistance/impaired     Grooming: Sitting;Moderate assistance   Upper Body Bathing: Maximal assistance;Cueing for safety;Sitting Upper Body Bathing Details (indicate cue type and reason): HOH required to reach underarms      Upper Body Dressing : Maximal assistance;Sitting                     General ADL Comments: Pt fluctuating sitting balance, min guard at times and with increased fatigue, increasing to max A with heavy L lean     Vision   Vision Assessment?: Vision impaired- to be further tested in functional context Additional Comments: Deficits observed in L upper visual field           Cognition Arousal/Alertness: Lethargic Behavior During Therapy: Flat affect Overall Cognitive Status: Difficult to assess Area of Impairment: Attention;Orientation;Memory;Following commands;Awareness;Safety/judgement;Problem solving                 Orientation Level: Disoriented to;Time;Situation Current Attention Level: Focused Memory: Decreased recall of precautions;Decreased short-term memory Following Commands: Follows one step commands inconsistently;Follows one step commands with increased time Safety/Judgement: Decreased awareness of safety;Decreased awareness of deficits Awareness: Intellectual Problem Solving: Slow processing;Decreased initiation;Difficulty sequencing;Requires verbal cues;Requires tactile cues         Pertinent Vitals/ Pain       Pain Assessment: Faces Faces Pain Scale: Hurts little more Pain Location: right leg Pain Descriptors / Indicators: Aching Pain Intervention(s): Monitored during session  Home  Living Family/patient expects to be discharged to:: Skilled nursing facility   Available Help at Discharge: Family                                Lives With: Spouse    Prior Functioning/Environment Level of Independence: Needs assistance        Comments: unknown prior  level of function. Patient with wound guards on B/L heels   Frequency  Min 2X/week        Progress Toward Goals  OT Goals(current goals can now be found in the care plan section)  Progress towards OT goals: Progressing toward goals  Acute Rehab OT Goals Patient Stated Goal: unable to state goals  OT Goal Formulation: Patient unable to participate in goal setting Time For Goal Achievement: 01/05/19 Potential to Achieve Goals: [place] Discharge plan remains appropriate    Co-evaluation    PT/OT/SLP Co-Evaluation/Treatment: Yes Reason for Co-Treatment: Complexity of the patient's impairments (multi-system involvement) PT goals addressed during session: Mobility/safety with mobility;Balance;Strengthening/ROM OT goals addressed during session: ADL's and self-care      AM-PAC OT "6 Clicks" Daily Activity     Outcome Measure   Help from another person eating meals?: Total Help from another person taking care of personal grooming?: A Lot Help from another person toileting, which includes using toliet, bedpan, or urinal?: Total Help from another person bathing (including washing, rinsing, drying)?: Total Help from another person to put on and taking off regular upper body clothing?: Total Help from another person to put on and taking off regular lower body clothing?: Total 6 Click Score: 7    End of Session Equipment Utilized During Treatment: Gait belt  OT Visit Diagnosis: Unsteadiness on feet (R26.81);Muscle weakness (generalized) (M62.81);Other symptoms and signs involving cognitive function;Adult, failure to thrive (R62.7)   Activity Tolerance Patient limited by fatigue;Patient limited by lethargy   Patient Left in bed;with call bell/phone within reach;with bed alarm set;with SCD's reapplied   Nurse Communication Mobility status        Time: 2500-3704 OT Time Calculation (min): 26 min  Charges: OT General Charges $OT Visit: 1 Visit OT Treatments $Self  Care/Home Management : 8-22 mins   Curtis Sites OTR/L  12/28/2018, 4:11 PM

## 2018-12-28 NOTE — Care Management Important Message (Signed)
Important Message  Patient Details  Name: Julian Tyler MRN: [id number] Date of Birth: 1945-12-20   Medicare Important Message Given:  Yes     Shelda Altes 12/28/2018, 3:19 PM

## 2018-12-28 NOTE — Progress Notes (Signed)
  Speech Language Pathology Treatment: Dysphagia  Patient Details Name: Julian Tyler MRN: [id number] DOB: 02-02-1946 Today's Date: 12/28/2018 Time: 1025-1050 SLP Time Calculation (min) (ACUTE ONLY): 25 min  Assessment / Plan / Recommendation Clinical Impression  Patient seen to address dysphagia goals with trials of nectar thick liquids via straw sips and ice chips. He continues to exhibited prolonged oral transit and swallow initiation delays with liquids even with straw sips, but did not exhibit any overt s/s of aspiration or penetration and oral cavity was clear post-swallows. Patient's main hindrance towards a recommendation of full PO diet is his ability to maintain an adequate level of alertness throughout the day. In addition, his oral and pharyngeal swallow delays result in decreased ability to take in enough PO's as would be expected during a meal. Per MD report, plan continues to be removal of Cortrak feeding tube over the weekend and monitor his progress, with Palliative and possibly Hospice involvement.    HPI HPI: Pt is a 73 y.o. male, w hypertension, hyperlipidemia, dm2, h/o stroke , seizure, dementia, who presented with altered mental status. Per nurse, pt had right side deficit, not able to walk by self, ? Slight slurred speech. Pt recently had seroquel , xanax, and depakote added due to combativeness. MRI of the brain was negative. Acute metabolic encephalopathy suspect to be secondary to to dehydration and hypernatremia. Urinalysis indicated possible infection; he was started on antibiotics.       SLP Plan  Continue with current plan of care       Recommendations  Diet recommendations: NPO Medication Administration: Via alternative means                Oral Care Recommendations: Oral care QID Follow up Recommendations: Skilled Nursing facility;24 hour supervision/assistance SLP Visit Diagnosis: Dysphagia, oropharyngeal phase (R13.12) Plan: Continue with current plan  of care       GO                Dannial Monarch 12/28/2018, 1:47 PM  [name], MA, CCC-SLP Speech Therapy [facility] Acute Rehab Pager: [phone]

## 2018-12-28 NOTE — Progress Notes (Signed)
PROGRESS NOTE    Julian Tyler  TFT:[id number] DOB: 02/01/1946 DOA: 12/11/2018 PCP: Milana Na., MD   Brief Narrative:  Patient is a 73 year old man complex past medical history including stroke with residual right-sided deficits, diabetes mellitus, dementia, recent complex hospitalization where he was treated for seizures and delirium in the setting of dementia who presented to the emergency department 6/30 as a code stroke, reported left facial droop and left arm weakness.  Stat EEG did not show seizure.  MRI negative.  Admitted for acute metabolic encephalopathy, hypernatremia, dehydration, acute kidney injury, possible UTI.  Seen by neurology but no specific neurologic diagnoses made.  Symptoms attributed to hypernatremia.  However the patient has not significantly improved despite correction of hypernatremia.  Hospitalization was complicated by prolonged encephalopathy which waxes and wanes.  Patient unable to swallow and has remained n.p.o.  In discussion with family, feeding tube was temporarily placed and tube feeds initiated.  Patient continues to have waxing and waning of his mental status and is at times awake and alert and follows commands at other times hardly participates with examination   Assessment & Plan:   Principal Problem:   Acute metabolic encephalopathy Active Problems:   DNR (do not resuscitate) discussion   Severe protein-calorie malnutrition ([place])   Anemia   Palliative care by specialist   Vascular dementia without behavioral disturbance ([place])   Seizure ([place])   Dysphagia   Adult failure to thrive   Type 2 diabetes mellitus without complication ([place])   Acute metabolic encephalopathy: Patient has history of baseline dementia.  Acute encephalopathy thought to be secondary to dehydration/hypernatremia. Sodium was 158 on admission.  Patient was also on multiple psych medications prior to admission.  ABG, ammonia were unremarkable.  EEG unremarkable.  MRI of  brain on admission was negative.  Mental status continues to wax and wane. Etiology remains obscure.  Acute worsening of dementia is possibility. He tries to follows commands.  But oriented only to self.  Continues to remain n.p.o.  Seizure disorder: No seizure reported.  EEG did not show any seizure.  Continue Keppra, Vimpat on reduced doses.  If seizure recurs, will increase Keppra back to 1 g twice a day and Vimpat 100 mg twice a day.  Diabetes type 2: Continue sliding-scale insulin and Lantus.  Continue monitoring CBC  Hypertension: Currently blood pressure stable.  Continue current medicines  History of vascular dementia with behavioral disturbance: Avoid sedatives.  Continue to monitor mental status  Severe protein calorie malnutrition/failure to thrive: Failed his swallowing evaluation.  Family desired feeding tube placement and initiation of temporary tube feeds.  Started tube feed on 7/10. Now the family wants the tube feeds to be out.Speech therapy following.  Borderline microcytic anemia: Currently stable.  Continue to follow  Abnormal echocardiogram with possible mobile density on aortic valve: Blood cultures did not show any growth.  TEE deferred due to patient's poor clinical status.  Patient's family also do not want to pursue any further testing.  Positive RPR/Treponema pallidum antibodies: Discussed with ID.  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  ID thought that this most likely false positive test. no indication for current treatment.  Abnormal serum/protein electrophoresis: No M spike. No further evaluation.  Discussed with oncology.  Acute kidney injury/hypernatremia: Resolved  History of CVA: Has weakness on the right side, slurred speech at baseline  Goals of care: Multiple discussion held with son, palliative care.  Son does not want PEG tube.  Family  Anticipating  improvement  in the mental status.Alowing  ice chips and sips of water .  Palliative care had extensive  discussion with the wife and son on 12/27/18.  They expressed their desire to remove the tube feeding on 12/28/18 but continue to insist on keeping him full code which does not make sense. I will call the son tomorrow.       Nutrition Problem: Inadequate oral intake Etiology: inability to eat      DVT prophylaxis: Lovenox Code Status: Full Family Communication: None Disposition Plan: Undetermined at this point.  Ongoing discussion with family on goals of care.Marland Kitchen Hospice candidate.  Consultants: Neurology, palliative medicine  Procedures: Echocardiogram, EEG  Antimicrobials:  Anti-infectives (From admission, onward)   Start     Dose/Rate Route Frequency Ordered Stop   12/12/18 2100  cefTRIAXone (ROCEPHIN) 1 g in sodium chloride 0.9 % 100 mL IVPB  Status:  Discontinued     1 g 200 mL/hr over 30 Minutes Intravenous Every 24 hours 12/12/18 2052 12/15/18 1932      Subjective:  Patient seen and examined the bedside this morning.  Hemodynamically stable.  Continues to remain lethargic, almost unresponsive on the bed.  Opens his eyes when calling his name loud or rubbing his chest.Tries to follow the command but quickly falls back to sleep/closes eyes.  Objective: Vitals:   12/27/18 2032 12/28/18 0002 12/28/18 0416 12/28/18 0832  BP: (!) 155/71 121/67 (!) 148/70 (!) 122/55  Pulse: (!) 103 90 87 94  Resp: 18 18 20  (!) 32  Temp: 98.7 F (37.1 C) 99 F (37.2 C) 98.9 F (37.2 C) 98.6 F (37 C)  TempSrc: Oral Axillary Axillary Oral  SpO2: 100% 100% 98% 100%  Weight:   118.8 kg     Intake/Output Summary (Last 24 hours) at 12/28/2018 1126 Last data filed at 12/28/2018 0003 Gross per 24 hour  Intake -  Output 1500 ml  Net -1500 ml   Filed Weights   12/25/18 0500 12/27/18 0409 12/28/18 0416  Weight: 104.8 kg 118.8 kg 118.8 kg    Examination:   General exam: Lethargic, lying on the bed supine, mostly unresponsive HEENT: Ear/Nose normal on gross exam, feeding tube  Respiratory system: No wheezes or crackles auscultated on the anterior chest Cardiovascular system: S1 & S2 heard, RRR. No JVD, murmurs, rubs, gallops or clicks. Gastrointestinal system: Abdomen is distended, soft and nontender. No organomegaly or masses felt. Normal bowel sounds heard. Central nervous system: Not alert and oriented.  Opens eyes and tries to speak when called his name extremities: No edema, no clubbing ,no cyanosis, distal peripheral pulses palpable. Skin: Pressure ulcers on bilateral feet    Data Reviewed: I have personally reviewed following labs and imaging studies  CBC: Recent Labs  Lab 12/27/18 0423  WBC 13.6*  NEUTROABS 10.6*  HGB 8.2*  HCT 27.9*  MCV 78.4*  PLT 993   Basic Metabolic Panel: Recent Labs  Lab 12/22/18 0806 12/23/18 0431 12/24/18 0500 12/25/18 0500 12/26/18 0440 12/27/18 0423  NA 135 133* 132* 135 135 135  K 3.9 4.2 4.9 4.3 3.8 4.0  CL 107 103 100 102 102 103  CO2 23 22 24 25 24 22   GLUCOSE 254* 214* 252* 255* 184* 200*  BUN 7* 8 11 11 11 13   CREATININE 0.70 0.69 0.68 0.74 0.71 0.77  CALCIUM 8.4* 8.7* 8.5* 8.6* 9.0 9.1  MG 1.6* 1.7 1.7 1.8  --   --   PHOS 2.0* 2.3* 2.7 2.7  --   --  GFR: Estimated Creatinine Clearance: 104.6 mL/min (by C-G formula based on SCr of 0.77 mg/dL). Liver Function Tests: No results for input(s): AST, ALT, ALKPHOS, BILITOT, PROT, ALBUMIN in the last 168 hours. No results for input(s): LIPASE, AMYLASE in the last 168 hours. No results for input(s): AMMONIA in the last 168 hours. Coagulation Profile: No results for input(s): INR, PROTIME in the last 168 hours. Cardiac Enzymes: No results for input(s): CKTOTAL, CKMB, CKMBINDEX, TROPONINI in the last 168 hours. BNP (last 3 results) No results for input(s): PROBNP in the last 8760 hours. HbA1C: No results for input(s): HGBA1C in the last 72 hours. CBG: Recent Labs  Lab 12/27/18 1540 12/27/18 2029 12/28/18 0001 12/28/18 0407 12/28/18 0828   GLUCAP 165* 170* 158* 128* 167*   Lipid Profile: No results for input(s): CHOL, HDL, LDLCALC, TRIG, CHOLHDL, LDLDIRECT in the last 72 hours. Thyroid Function Tests: No results for input(s): TSH, T4TOTAL, FREET4, T3FREE, THYROIDAB in the last 72 hours. Anemia Panel: No results for input(s): VITAMINB12, FOLATE, FERRITIN, TIBC, IRON, RETICCTPCT in the last 72 hours. Sepsis Labs: No results for input(s): PROCALCITON, LATICACIDVEN in the last 168 hours.  Recent Results (from the past 240 hour(s))  Culture, blood (Routine X 2) w Reflex to ID Panel     Status: None   Collection Time: 12/19/18  2:32 PM   Specimen: BLOOD RIGHT HAND  Result Value Ref Range Status   Specimen Description BLOOD RIGHT HAND  Final   Special Requests   Final    BOTTLES DRAWN AEROBIC ONLY Blood Culture adequate volume   Culture   Final    NO GROWTH 5 DAYS Performed at [facility], [address], [place], [place] [address]    Report Status 12/24/2018 FINAL  Final  Culture, blood (Routine X 2) w Reflex to ID Panel     Status: None   Collection Time: 12/19/18  2:37 PM   Specimen: BLOOD LEFT HAND  Result Value Ref Range Status   Specimen Description BLOOD LEFT HAND  Final   Special Requests   Final    BOTTLES DRAWN AEROBIC ONLY Blood Culture adequate volume   Culture   Final    NO GROWTH 5 DAYS Performed at [facility], [place] [address] [place], [place] [address]    Report Status 12/24/2018 FINAL  Final         Radiology Studies: No results found.      Scheduled Meds: . amLODipine  10 mg Per Tube Daily  . aspirin  325 mg Per Tube Daily   Or  . aspirin  300 mg Rectal Daily  . chlorhexidine  15 mL Mouth Rinse BID  . dorzolamide  1 drop Both Eyes BID  . enoxaparin (LOVENOX) injection  40 mg Subcutaneous Q24H  . feeding supplement (PRO-STAT SUGAR FREE 64)  30 mL Per Tube Daily  . hydrALAZINE  100 mg Per Tube BID  . insulin aspart  0-9 Units Subcutaneous Q4H  . insulin aspart  3 Units  Subcutaneous Q4H  . insulin glargine  15 Units Subcutaneous Daily  . lacosamide  50 mg Per Tube BID  . levETIRAcetam  500 mg Per Tube BID  . lisinopril  20 mg Per Tube Daily  . mouth rinse  15 mL Mouth Rinse q12n4p  . metoprolol tartrate  25 mg Per Tube BID  . nicotine  21 mg Transdermal Daily  . polyethylene glycol  17 g Per Tube BID  . senna  1 tablet  Per Tube QHS  . sodium chloride flush  10-40 mL Intracatheter Q12H   Continuous Infusions: . feeding supplement (JEVITY 1.5 CAL/FIBER) 1,000 mL (12/25/18 2111)     LOS: 16 days    Time spent: 35 mins.More than 50% of that time was spent in counseling and/or coordination of care.      Shelly Coss, MD [facility] Pager [phone]  If 7PM-7AM, please contact night-coverage [email or web address] Password [facility] 12/28/2018, 11:26 AM

## 2018-12-28 NOTE — Progress Notes (Signed)
Physical Therapy Treatment Patient Details Name: Julian Tyler MRN: [id number] DOB: [birth year] Today's Date: 12/28/2018    History of Present Illness Pt is a 73 y/o nursing home resident who presents with AMS. MRI negative, and acute metabolic encephalopathy suspected to be 2 dehydration and hypernatremia. Possible UTI as well. PMH: stroke, CKD, Dry eyes, DM, hyperlipidemia    PT Comments    Pt more alert and participatory in therapy session today than he has been in the past.  Pt tolerated sitting EOB for ~15 minutes and participated in trunk stabilization activity, grooming activity, and postural exercise. Will continue to follow and progress as able per POC.    Follow Up Recommendations  SNF;Supervision/Assistance - 24 hour     Equipment Recommendations  None recommended by PT    Recommendations for Other Services Rehab consult     Precautions / Restrictions Precautions Precautions: None Restrictions Weight Bearing Restrictions: No    Mobility  Bed Mobility Overal bed mobility: Needs Assistance Bed Mobility: Supine to Sit;Sit to Supine     Supine to sit: Total assist;+2 for physical assistance Sit to supine: Total assist;+2 for physical assistance;+2 for safety/equipment   General bed mobility comments: Bed pad and +2 assist required for transition to/from EOB. Pt able to tolerate sitting EOB ~15 minutes with up to max assist for sitting balance however was able to sit min guard assist initially.  Transfers                 General transfer comment: Pt is appropriate for transition OOB to chair with maxi-move with nursing staff.   Ambulation/Gait                 Stairs             Wheelchair Mobility    Modified Rankin (Stroke Patients Only)       Balance Overall balance assessment: Needs assistance Sitting-balance support: No upper extremity supported;Feet supported Sitting balance-Leahy Scale: Poor                                      Cognition Arousal/Alertness: Lethargic Behavior During Therapy: Flat affect Overall Cognitive Status: Difficult to assess Area of Impairment: Attention;Orientation;Memory;Following commands;Awareness;Safety/judgement;Problem solving                 Orientation Level: Disoriented to;Time;Situation Current Attention Level: Focused Memory: Decreased recall of precautions;Decreased short-term memory Following Commands: Follows one step commands inconsistently;Follows one step commands with increased time Safety/Judgement: Decreased awareness of safety;Decreased awareness of deficits Awareness: Intellectual Problem Solving: Slow processing;Decreased initiation;Difficulty sequencing;Requires verbal cues;Requires tactile cues        Exercises      General Comments        Pertinent Vitals/Pain Pain Assessment: Faces Faces Pain Scale: Hurts little more Pain Location: right leg Pain Descriptors / Indicators: Aching Pain Intervention(s): Monitored during session    Home Living Family/patient expects to be discharged to:: Skilled nursing facility   Available Help at Discharge: Family                Prior Function Level of Independence: Needs assistance      Comments: unknown prior level of function. Patient with wound guards on B/L heels   PT Goals (current goals can now be found in the care plan section) Acute Rehab PT Goals Patient Stated Goal: unable to state goals  PT Goal Formulation: Patient unable to  participate in goal setting Time For Goal Achievement: 01/03/19 Potential to Achieve Goals: Fair Progress towards PT goals: Progressing toward goals    Frequency    Min 2X/week      PT Plan Current plan remains appropriate    Co-evaluation PT/OT/SLP Co-Evaluation/Treatment: Yes Reason for Co-Treatment: Complexity of the patient's impairments (multi-system involvement);Necessary to address cognition/behavior during functional  activity;For patient/therapist safety;To address functional/ADL transfers PT goals addressed during session: Mobility/safety with mobility;Balance;Strengthening/ROM        AM-PAC PT "6 Clicks" Mobility   Outcome Measure  Help needed turning from your back to your side while in a flat bed without using bedrails?: Total Help needed moving from lying on your back to sitting on the side of a flat bed without using bedrails?: Total Help needed moving to and from a bed to a chair (including a wheelchair)?: Total Help needed standing up from a chair using your arms (e.g., wheelchair or bedside chair)?: Total Help needed to walk in hospital room?: Total Help needed climbing 3-5 steps with a railing? : Total 6 Click Score: 6    End of Session Equipment Utilized During Treatment: Gait belt Activity Tolerance: Patient limited by lethargy Patient left: in bed;with call bell/phone within reach;with bed alarm set Nurse Communication: Mobility status PT Visit Diagnosis: Other abnormalities of gait and mobility (R26.89);Muscle weakness (generalized) (M62.81)     Time: 0601-5615 PT Time Calculation (min) (ACUTE ONLY): 25 min  Charges:  $Therapeutic Activity: 8-22 mins                     Rolinda Roan, PT, DPT Acute Rehabilitation Services Pager: [phone] Office: [place] 12/28/2018, 3:11 PM

## 2018-12-29 ENCOUNTER — Inpatient Hospital Stay (HOSPITAL_COMMUNITY): Payer: Medicare Other

## 2018-12-29 DIAGNOSIS — L8990 Pressure ulcer of unspecified site, unspecified stage: Secondary | ICD-10-CM | POA: Insufficient documentation

## 2018-12-29 LAB — CBC WITH DIFFERENTIAL/PLATELET
Abs Immature Granulocytes: 0.09 10*3/uL — ABNORMAL HIGH (ref 0.00–0.07)
Basophils Absolute: 0.1 10*3/uL (ref 0.0–0.1)
Basophils Relative: 0 %
Eosinophils Absolute: 0.3 10*3/uL (ref 0.0–0.5)
Eosinophils Relative: 2 %
HCT: 26 % — ABNORMAL LOW (ref 39.0–52.0)
Hemoglobin: 7.6 g/dL — ABNORMAL LOW (ref 13.0–17.0)
Immature Granulocytes: 1 %
Lymphocytes Relative: 13 %
Lymphs Abs: 1.7 10*3/uL (ref 0.7–4.0)
MCH: 23.2 pg — ABNORMAL LOW (ref 26.0–34.0)
MCHC: 29.2 g/dL — ABNORMAL LOW (ref 30.0–36.0)
MCV: 79.5 fL — ABNORMAL LOW (ref 80.0–100.0)
Monocytes Absolute: 0.7 10*3/uL (ref 0.1–1.0)
Monocytes Relative: 5 %
Neutro Abs: 10.6 10*3/uL — ABNORMAL HIGH (ref 1.7–7.7)
Neutrophils Relative %: 79 %
Platelets: 429 10*3/uL — ABNORMAL HIGH (ref 150–400)
RBC: 3.27 MIL/uL — ABNORMAL LOW (ref 4.22–5.81)
RDW: 17 % — ABNORMAL HIGH (ref 11.5–15.5)
WBC: 13.5 10*3/uL — ABNORMAL HIGH (ref 4.0–10.5)
nRBC: 0.1 % (ref 0.0–0.2)

## 2018-12-29 LAB — BASIC METABOLIC PANEL
Anion gap: 8 (ref 5–15)
BUN: 13 mg/dL (ref 8–23)
CO2: 23 mmol/L (ref 22–32)
Calcium: 9.1 mg/dL (ref 8.9–10.3)
Chloride: 106 mmol/L (ref 98–111)
Creatinine, Ser: 0.71 mg/dL (ref 0.61–1.24)
GFR calc Af Amer: >60 mL/min (ref >60)
GFR calc non Af Amer: >60 mL/min (ref >60)
Glucose, Bld: 124 mg/dL — ABNORMAL HIGH (ref 70–99)
Potassium: 3.8 mmol/L (ref 3.5–5.1)
Sodium: 137 mmol/L (ref 135–145)

## 2018-12-29 LAB — GLUCOSE, CAPILLARY
Glucose-Capillary: 114 mg/dL — ABNORMAL HIGH (ref 70–99)
Glucose-Capillary: 123 mg/dL — ABNORMAL HIGH (ref 70–99)
Glucose-Capillary: 124 mg/dL — ABNORMAL HIGH (ref 70–99)
Glucose-Capillary: 125 mg/dL — ABNORMAL HIGH (ref 70–99)
Glucose-Capillary: 146 mg/dL — ABNORMAL HIGH (ref 70–99)
Glucose-Capillary: 150 mg/dL — ABNORMAL HIGH (ref 70–99)

## 2018-12-29 MED ORDER — ENALAPRILAT 1.25 MG/ML IV SOLN
0.6250 mg | Freq: Once | INTRAVENOUS | Status: AC
  Administered 2018-12-29: 0.625 mg via INTRAVENOUS
  Filled 2018-12-29: qty 0.5

## 2018-12-29 MED ORDER — DEXTROSE-NACL 5-0.9 % IV SOLN
INTRAVENOUS | Status: DC
  Administered 2018-12-29 – 2018-12-30 (×2): via INTRAVENOUS

## 2018-12-29 NOTE — Plan of Care (Signed)
Progressing towards goals

## 2018-12-29 NOTE — Progress Notes (Signed)
PROGRESS NOTE    Julian Tyler  WHQ:[id number] DOB: 1946/01/26 DOA: 12/11/2018 PCP: Milana Na., MD   Brief Narrative:  Patient is a 73 year old man complex past medical history including stroke with residual right-sided deficits, diabetes mellitus, dementia, recent complex hospitalization where he was treated for seizures and delirium in the setting of dementia who presented to the emergency department 6/30 as a code stroke, reported left facial droop and left arm weakness.  Stat EEG did not show seizure.  MRI negative.  Admitted for acute metabolic encephalopathy, hypernatremia, dehydration, acute kidney injury, possible UTI.  Seen by neurology but no specific neurologic diagnoses made.  Symptoms attributed to hypernatremia.  However the patient has not significantly improved despite correction of hypernatremia.  Hospitalization was complicated by prolonged encephalopathy which waxes and wanes.  Patient unable to swallow and has remained n.p.o.  In discussion with family, feeding tube was temporarily placed and tube feeds initiated.  Patient continues to have waxing and waning of his mental status and is at times awake and alert and follows commands at other times hardly participates with examination.   Assessment & Plan:   Principal Problem:   Acute metabolic encephalopathy Active Problems:   DNR (do not resuscitate) discussion   Severe protein-calorie malnutrition ([place])   Anemia   Palliative care by specialist   Vascular dementia without behavioral disturbance ([place])   Seizure ([place])   Dysphagia   Adult failure to thrive   Type 2 diabetes mellitus without complication ([place])   Acute metabolic encephalopathy: Patient has history of baseline dementia.  Acute encephalopathy thought to be secondary to dehydration/hypernatremia. Sodium was 158 on admission.  Patient was also on multiple psych medications prior to admission.  ABG, ammonia were unremarkable.  EEG unremarkable.  MRI  of brain on admission was negative.  Mental status continues to wax and wane. Etiology remains obscure.  Acute worsening of dementia is possibility. He tries to follows commands.  But oriented only to self.  Continues to remain n.p.o.  Seizure disorder: No seizure reported.  EEG did not show any seizure.  Continue Keppra, Vimpat on reduced doses.  If seizure recurs, will increase Keppra back to 1 g twice a day and Vimpat 100 mg twice a day.  Diabetes type 2: Continue sliding-scale insulin .  Continue monitoring CBC  Hypertension: Currently blood pressure stable.  Continue current medicines  History of vascular dementia with behavioral disturbance: Avoid sedatives.  Continue to monitor mental status  Severe protein calorie malnutrition/failure to thrive: Failed his swallowing evaluation.  Family desired feeding tube placement and initiation of temporary tube feeds.  Started tube feed on 7/10. Family wants to continue tube feeding.Speech therapy following.Her pulled his feeding tube out on 12/28/18. Planning to put a new one.  Borderline microcytic anemia: Currently stable.  Continue to follow  Abnormal echocardiogram with possible mobile density on aortic valve: Blood cultures did not show any growth.  TEE deferred due to patient's poor clinical status.  Patient's family also do not want to pursue any further testing.  Positive RPR/Treponema pallidum antibodies: Discussed with ID.  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  ID thought that this most likely false positive test. no indication for current treatment.  Abnormal serum/protein electrophoresis: No M spike. No further evaluation.  Discussed with oncology.  Acute kidney injury/hypernatremia: Resolved  History of CVA: Has weakness on the right side, slurred speech at baseline  Goals of care: Multiple discussion held with son, palliative care.  Son does not want  PEG tube.  Family  Anticipating  improvement in the mental status.Alowing  ice chips  and sips of water .  Palliative care had extensive discussion with the wife and son on 12/27/18.  Now they have  expressed their desire to continue  the tube feeding , keeping him full code.      Nutrition Problem: Inadequate oral intake Etiology: inability to eat      DVT prophylaxis: Lovenox Code Status: Full Family Communication: None Disposition Plan: Discussed with son on phone on 12/28/2018.  He desired to continue the tube feeding.  Consultants: Neurology, palliative medicine  Procedures: Echocardiogram, EEG  Antimicrobials:  Anti-infectives (From admission, onward)   Start     Dose/Rate Route Frequency Ordered Stop   12/12/18 2100  cefTRIAXone (ROCEPHIN) 1 g in sodium chloride 0.9 % 100 mL IVPB  Status:  Discontinued     1 g 200 mL/hr over 30 Minutes Intravenous Every 24 hours 12/12/18 2052 12/15/18 1932      Subjective:  Patient seen and examined the bedside this morning.  Currently hemodynamically stable.  He pulled his feeding tube out yesterday.  NG was placed yesterday evening but he pulled that tube.  This morning he looked more alert and awake.  He was talking.  He could tell his date of birth, home address, name of his son and he knew that he is in the hospital.  Objective: Vitals:   12/28/18 2358 12/29/18 0157 12/29/18 0405 12/29/18 0856  BP: (!) 174/74 (!) 184/91 (!) 147/78 (!) 151/73  Pulse: (!) 103 96 89 73  Resp: 18 20 (!) 22 20  Temp: 98.7 F (37.1 C) 98.1 F (36.7 C) 98.7 F (37.1 C) 98.6 F (37 C)  TempSrc: Oral Oral Oral Oral  SpO2: 99% 96% 100% 100%  Weight:   108 kg     Intake/Output Summary (Last 24 hours) at 12/29/2018 1105 Last data filed at 12/29/2018 0410 Gross per 24 hour  Intake -  Output 1500 ml  Net -1500 ml   Filed Weights   12/27/18 0409 12/28/18 0416 12/29/18 0405  Weight: 118.8 kg 118.8 kg 108 kg    Examination:   General exam:Weak, lying on the bed supine  HEENT: Ear/Nose normal on gross exam Respiratory system:  No wheezes or crackles auscultated on the anterior chest Cardiovascular system: S1 & S2 heard, RRR. No JVD, murmurs, rubs, gallops or clicks. Gastrointestinal system: Abdomen is distended, soft and nontender. No organomegaly or masses felt. Normal bowel sounds heard. Central nervous system: Awake and alert this morning but fell back to sleep shortly after.   He was talking.  He could tell his date of birth, home address, name of his son and he knew that he is in the hospital. extremities: No edema, no clubbing ,no cyanosis, distal peripheral pulses palpable. Skin: Pressure ulcers on bilateral feet    Data Reviewed: I have personally reviewed following labs and imaging studies  CBC: Recent Labs  Lab 12/27/18 0423 12/29/18 0320  WBC 13.6* 13.5*  NEUTROABS 10.6* 10.6*  HGB 8.2* 7.6*  HCT 27.9* 26.0*  MCV 78.4* 79.5*  PLT 388 517*   Basic Metabolic Panel: Recent Labs  Lab 12/23/18 0431 12/24/18 0500 12/25/18 0500 12/26/18 0440 12/27/18 0423 12/29/18 0320  NA 133* 132* 135 135 135 137  K 4.2 4.9 4.3 3.8 4.0 3.8  CL 103 100 102 102 103 106  CO2 22 24 25 24 22 23   GLUCOSE 214* 252* 255* 184* 200* 124*  BUN  8 11 11 11 13 13   CREATININE 0.69 0.68 0.74 0.71 0.77 0.71  CALCIUM 8.7* 8.5* 8.6* 9.0 9.1 9.1  MG 1.7 1.7 1.8  --   --   --   PHOS 2.3* 2.7 2.7  --   --   --    GFR: Estimated Creatinine Clearance: 99.4 mL/min (by C-G formula based on SCr of 0.71 mg/dL). Liver Function Tests: No results for input(s): AST, ALT, ALKPHOS, BILITOT, PROT, ALBUMIN in the last 168 hours. No results for input(s): LIPASE, AMYLASE in the last 168 hours. No results for input(s): AMMONIA in the last 168 hours. Coagulation Profile: No results for input(s): INR, PROTIME in the last 168 hours. Cardiac Enzymes: No results for input(s): CKTOTAL, CKMB, CKMBINDEX, TROPONINI in the last 168 hours. BNP (last 3 results) No results for input(s): PROBNP in the last 8760 hours. HbA1C: No results for  input(s): HGBA1C in the last 72 hours. CBG: Recent Labs  Lab 12/28/18 1627 12/28/18 2012 12/28/18 2355 12/29/18 0406 12/29/18 0737  GLUCAP 129* 133* 122* 124* 114*   Lipid Profile: No results for input(s): CHOL, HDL, LDLCALC, TRIG, CHOLHDL, LDLDIRECT in the last 72 hours. Thyroid Function Tests: No results for input(s): TSH, T4TOTAL, FREET4, T3FREE, THYROIDAB in the last 72 hours. Anemia Panel: No results for input(s): VITAMINB12, FOLATE, FERRITIN, TIBC, IRON, RETICCTPCT in the last 72 hours. Sepsis Labs: No results for input(s): PROCALCITON, LATICACIDVEN in the last 168 hours.  Recent Results (from the past 240 hour(s))  Culture, blood (Routine X 2) w Reflex to ID Panel     Status: None   Collection Time: 12/19/18  2:32 PM   Specimen: BLOOD RIGHT HAND  Result Value Ref Range Status   Specimen Description BLOOD RIGHT HAND  Final   Special Requests   Final    BOTTLES DRAWN AEROBIC ONLY Blood Culture adequate volume   Culture   Final    NO GROWTH 5 DAYS Performed at [facility], [address] [place], [place] [address]    Report Status 12/24/2018 FINAL  Final  Culture, blood (Routine X 2) w Reflex to ID Panel     Status: None   Collection Time: 12/19/18  2:37 PM   Specimen: BLOOD LEFT HAND  Result Value Ref Range Status   Specimen Description BLOOD LEFT HAND  Final   Special Requests   Final    BOTTLES DRAWN AEROBIC ONLY Blood Culture adequate volume   Culture   Final    NO GROWTH 5 DAYS Performed at [facility], [place] [address], [place], [place] [address]    Report Status 12/24/2018 FINAL  Final         Radiology Studies: Dg Chest Port 1 View  Result Date: 12/29/2018 CLINICAL DATA:  Shortness of breath. Aspiration. EXAM: PORTABLE CHEST 1 VIEW COMPARISON:  Chest radiograph 12/12/2018 FINDINGS: Low lung volumes persist. Mild bibasilar atelectasis, similar to prior. No new airspace disease. Unchanged heart size and mediastinal contours. No pulmonary  edema, large pleural effusion or pneumothorax. Unchanged osseous structures. IMPRESSION: Persistent low lung volumes with bibasilar atelectasis. No significant change from prior exam. Electronically Signed   By: Keith Rake M.D.   On: 12/29/2018 02:52   Dg Abd Portable 1v  Result Date: 12/28/2018 CLINICAL DATA:  73 year old male with enteric tube placement. EXAM: PORTABLE ABDOMEN - 1 VIEW COMPARISON:  Radiograph dated 12/25/2018 FINDINGS: There has been interval removal of previously seen feeding tube and placement of an enteric tube. The tip  of the tube is in the right upper abdomen, likely in the distal stomach. No bowel dilatation. Nonspecific bowel gas pattern. Degenerative changes of the spine. IMPRESSION: Enteric tube with tip in the distal stomach. Electronically Signed   By: Anner Crete M.D.   On: 12/28/2018 23:21        Scheduled Meds: . amLODipine  10 mg Per Tube Daily  . aspirin  325 mg Per Tube Daily   Or  . aspirin  300 mg Rectal Daily  . chlorhexidine  15 mL Mouth Rinse BID  . dorzolamide  1 drop Both Eyes BID  . enoxaparin (LOVENOX) injection  40 mg Subcutaneous Q24H  . feeding supplement (PRO-STAT SUGAR FREE 64)  30 mL Per Tube Daily  . hydrALAZINE  100 mg Per Tube BID  . insulin aspart  0-9 Units Subcutaneous Q4H  . insulin aspart  3 Units Subcutaneous Q4H  . insulin glargine  15 Units Subcutaneous Daily  . lacosamide  50 mg Per Tube BID  . levETIRAcetam  500 mg Per Tube BID  . lisinopril  20 mg Per Tube Daily  . mouth rinse  15 mL Mouth Rinse q12n4p  . metoprolol tartrate  25 mg Per Tube BID  . nicotine  21 mg Transdermal Daily  . polyethylene glycol  17 g Per Tube BID  . senna  1 tablet Per Tube QHS  . sodium chloride flush  10-40 mL Intracatheter Q12H   Continuous Infusions: . dextrose 5 % and 0.9% NaCl    . feeding supplement (JEVITY 1.5 CAL/FIBER) 1,000 mL (12/29/18 0033)     LOS: 17 days    Time spent: 35 mins.More than 50% of that time was  spent in counseling and/or coordination of care.      Shelly Coss, MD [facility] Pager [phone]  If 7PM-7AM, please contact night-coverage [email or web address] Password [facility] 12/29/2018, 11:05 AM

## 2018-12-29 NOTE — Progress Notes (Signed)
Palliative medicine progress note  Patient son, Mr. [name], called the palliative medicine team number and asked that we update him today and allow him to speak to his father.  Patient was more alert than my last visit with him ( which is been a while) and he was able to participate in a phone call with his son and answered most questions relevantly.  He tired very quickly and laid the phone down.   Mr. Manninen pulled his NG tube out last night.  Plans are in place to reinsert either NG or core track.  SLP has been by to see patient but he has been too somnolent to participate in assessment again.  Family has really been struggling with keeping patient a full code but then stating no PEG tube.  Patient is not able to take in enough nutrition at this point secondary to ongoing waxing and waning level of consciousness.  You can come visit him and he will be alert and attempting to talk to you and in 15 minutes to be sound asleep  When speaking with [name], [name] continues to state my dad told me to" try to do everything to save him".  I reiterated if that is the case and since there are 9 other siblings opinions as well as his wife to consider, allowing for more time which would mean a PEG tube may be an option to reconsider.  I also discussed with [name] at some point this is going to become very clear one way or another , that Mr. Dahms will declare himself.  Patient Profile: 73 y.o. male  with past medical history of dementia, seizure disorder, hypertension, hyperlipidemia, diabetes type 2, history of CVA admitted on 12/11/2018 with failure to thrive picture, and altered mental status, hyper natremia.  Code stroke was called on patient as he had significant facial droop.  MRI of the brain performed on 12/11/2018 showed no acute processes, no CVA; moderate to severe atrophy as well as microvascular changes.  Other imaging reveals advanced intracranial arterial occlusions without eminent  large vessel occlusions been present.  Consult ordered for goals of care.   Patient has been having a waxing and waning level of consciousness which is impacting his ability to eat.  He has had temporary measures, such as a cor track as well as an NG tube; which either became clogged and in the case of the NG tube, he pulled it out.  Family is still verbalizing seeing some improvement and wanting to give him more time to see if his swallow can eventually improve  Plan  Replace NG tube for feeding mechanism and administration of medications over the weekend.  Family reconsidering PEG tube placement  Agreed to call patient's son on 12/30/2018 to update him as to his father's condition and allow him to speak to him via phone .  [name] had a coworker to test positive for COVID-19 and is on quarantine at home currently  Disposition Likely SNF  Prognosis Patient's prognosis is difficult to ascertain.  He certainly is at high risk for an acute aspiration event whether he has a PEG tube or is eating for comfort.  Given his comorbidities even with a PEG tube he would likely meet hospice criteria with a prognosis of less than 6 months, who can support him in the skilled nursing facility, and transition to residential hospice when he declines further  Thank you, Romona Curls, NP Total time: 35 minutes Greater than 50%  of time was spent in counseling and coordination of care

## 2018-12-29 NOTE — Progress Notes (Signed)
Noted Pt pulled out NG tube while feeding was infusing at 0200. Lungs sound changes to wheezes from clear in bilateral upper lobe. Denies SOB. BP elevated. Afebrile.  NP notified with order of vasotec and CXR.

## 2018-12-29 NOTE — Progress Notes (Signed)
  Speech Language Pathology Treatment: Dysphagia  Patient Details Name: Julian Tyler MRN: [id number] DOB: Jan 31, 1946 Today's Date: 12/29/2018 Time: 1350-1405 SLP Time Calculation (min) (ACUTE ONLY): 15 min  Assessment / Plan / Recommendation Clinical Impression  Patient seen to address dysphagia goals with trials of nectar thick liquids and puree solids. Patient was able to self-manage cup sips of nectar liquid after SLP placed in hand, but puree solids were administered by SLP. No overt s/s of aspiration or penetration, only minimal anterior spillage of nectar liquids, and full clearance of oral cavity post swallows. He continues with delayed swallow initiation, but more timely than when PO trials were initially started. Patient is safe to trial meds crushed in puree, nectar thick liquids or puree solids for pleasure/comfort as well as ice chips or plain water. Patient must be fully alert when taking PO's.    HPI HPI: Pt is a 73 y.o. male, w hypertension, hyperlipidemia, dm2, h/o stroke , seizure, dementia, who presented with altered mental status. Per nurse, pt had right side deficit, not able to walk by self, ? Slight slurred speech. Pt recently had seroquel , xanax, and depakote added due to combativeness. MRI of the brain was negative. Acute metabolic encephalopathy suspect to be secondary to to dehydration and hypernatremia. Urinalysis indicated possible infection; he was started on antibiotics.       SLP Plan  Continue with current plan of care       Recommendations  Diet recommendations: NPO;Other(comment)(nectar liquids, puree solids for pleasure, ice chips, water for pleasure) Medication Administration: Crushed with puree Supervision: Full supervision/cueing for compensatory strategies;Staff to assist with self feeding Compensations: Minimize environmental distractions;Slow rate;Small sips/bites Postural Changes and/or Swallow Maneuvers: Seated upright 90 degrees                 Oral Care Recommendations: Oral care QID Follow up Recommendations: Skilled Nursing facility;24 hour supervision/assistance SLP Visit Diagnosis: Dysphagia, oropharyngeal phase (R13.12) Plan: Continue with current plan of care       GO                Dannial Monarch 12/29/2018, 3:01 PM   [name], MA, [facility] Pager: [phone]

## 2018-12-30 DIAGNOSIS — T17908D Unspecified foreign body in respiratory tract, part unspecified causing other injury, subsequent encounter: Secondary | ICD-10-CM

## 2018-12-30 DIAGNOSIS — T17908A Unspecified foreign body in respiratory tract, part unspecified causing other injury, initial encounter: Secondary | ICD-10-CM

## 2018-12-30 LAB — BASIC METABOLIC PANEL
Anion gap: 9 (ref 5–15)
BUN: 11 mg/dL (ref 8–23)
CO2: 18 mmol/L — ABNORMAL LOW (ref 22–32)
Calcium: 8.7 mg/dL — ABNORMAL LOW (ref 8.9–10.3)
Chloride: 108 mmol/L (ref 98–111)
Creatinine, Ser: 0.69 mg/dL (ref 0.61–1.24)
GFR calc Af Amer: >60 mL/min (ref >60)
GFR calc non Af Amer: >60 mL/min (ref >60)
Glucose, Bld: 127 mg/dL — ABNORMAL HIGH (ref 70–99)
Potassium: 4.9 mmol/L (ref 3.5–5.1)
Sodium: 135 mmol/L (ref 135–145)

## 2018-12-30 LAB — GLUCOSE, CAPILLARY
Glucose-Capillary: 141 mg/dL — ABNORMAL HIGH (ref 70–99)
Glucose-Capillary: 152 mg/dL — ABNORMAL HIGH (ref 70–99)
Glucose-Capillary: 135 mg/dL — ABNORMAL HIGH (ref 70–99)
Glucose-Capillary: 148 mg/dL — ABNORMAL HIGH (ref 70–99)
Glucose-Capillary: 137 mg/dL — ABNORMAL HIGH (ref 70–99)
Glucose-Capillary: 120 mg/dL — ABNORMAL HIGH (ref 70–99)
Glucose-Capillary: 121 mg/dL — ABNORMAL HIGH (ref 70–99)
Glucose-Capillary: 129 mg/dL — ABNORMAL HIGH (ref 70–99)

## 2018-12-30 MED ORDER — SODIUM CHLORIDE 0.9 % IV SOLN
50.0000 mg | Freq: Two times a day (BID) | INTRAVENOUS | Status: DC
  Administered 2018-12-30 – 2019-01-03 (×10): 50 mg via INTRAVENOUS
  Filled 2018-12-30 (×13): qty 5

## 2018-12-30 MED ORDER — LEVETIRACETAM IN NACL 500 MG/100ML IV SOLN
500.0000 mg | Freq: Two times a day (BID) | INTRAVENOUS | Status: DC
  Administered 2018-12-30 – 2019-01-03 (×10): 500 mg via INTRAVENOUS
  Filled 2018-12-30 (×10): qty 100

## 2018-12-30 NOTE — Progress Notes (Signed)
PROGRESS NOTE    Julian Tyler  DXI:[id number] DOB: 05/24/1946 DOA: 12/11/2018 PCP: Milana Na., MD   Brief Narrative:  Patient is a 73 year old man complex past medical history including stroke with residual right-sided deficits, diabetes mellitus, dementia, recent complex hospitalization where he was treated for seizures and delirium in the setting of dementia who presented to the emergency department 6/30 as a code stroke, reported left facial droop and left arm weakness.  Stat EEG did not show seizure.  MRI negative.  Admitted for acute metabolic encephalopathy, hypernatremia, dehydration, acute kidney injury, possible UTI.  Seen by neurology but no specific neurologic diagnoses made.  Symptoms attributed to hypernatremia.  However the patient has not significantly improved despite correction of hypernatremia.  Hospitalization was complicated by prolonged encephalopathy which waxes and wanes.  Patient unable to swallow and has remained n.p.o.  In discussion with family, feeding tube was temporarily placed and tube feeds initiated.  Patient continues to have waxing and waning of his mental status and is at times awake and alert and follows commands at other times hardly participates with examination. After discussion with the son since last few days, he has expressed his desire to continue tube feeding for now.  Patient remains full code.   Assessment & Plan:   Principal Problem:   Acute metabolic encephalopathy Active Problems:   DNR (do not resuscitate) discussion   Severe protein-calorie malnutrition ([place])   Anemia   Palliative care by specialist   Vascular dementia without behavioral disturbance ([place])   Seizure ([place])   Dysphagia   Adult failure to thrive   Type 2 diabetes mellitus without complication ([facility])   Pressure injury of skin   Acute metabolic encephalopathy: Patient has history of baseline dementia.  Acute encephalopathy thought to be secondary to  dehydration/hypernatremia. Sodium was 158 on admission.  Patient was also on multiple psych medications prior to admission.  ABG, ammonia were unremarkable.  EEG unremarkable.  MRI of brain on admission was negative.  Mental status continues to wax and wane. Etiology remains obscure.  Acute worsening of dementia is possibility. He tries to follows commands.  But oriented only to self.  Continues to remain n.p.o.  Seizure disorder: No seizure reported.  EEG did not show any seizure.  Continue Keppra, Vimpat on reduced doses.  If seizure recurs, will increase Keppra back to 1 g twice a day and Vimpat 100 mg twice a day.  Diabetes type 2: Continue sliding-scale insulin .  Continue monitoring CBC  Hypertension: Currently blood pressure stable.  Continue current medicines  History of vascular dementia with behavioral disturbance: Avoid sedatives.  Continue to monitor mental status  Severe protein calorie malnutrition/failure to thrive: Failed his swallowing evaluation.  Family desired feeding tube placement and initiation of temporary tube feeds.  Started tube feed on 7/10. Family wants to continue tube feeding.Speech therapy following.Her pulled his feeding tube out on 12/28/18. Planning to put a new one.  Borderline microcytic anemia: Currently stable.  Continue to follow  Abnormal echocardiogram with possible mobile density on aortic valve: Blood cultures did not show any growth.  TEE deferred due to patient's poor clinical status.  Patient's family also do not want to pursue any further testing.  Positive RPR/Treponema pallidum antibodies: Discussed with ID.  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  ID thought that this most likely false positive test. no indication for current treatment.  Abnormal serum/protein electrophoresis: No M spike. No further evaluation.  Discussed with oncology.  Acute kidney injury/hypernatremia:  Resolved  History of CVA: Has weakness on the right side, slurred speech at  baseline  Goals of care: Multiple discussion held with son.Palliative care following.  Son does not want PEG tube.  Family  Still Anticipating  improvement in the mental status.Alowing  ice chips and sips of water .  Palliative care had extensive discussion with the wife and son on 12/27/18.   After discussion with me on 12/28/18,son has   expressed  desire to continue  the tube feeding , keeping him full code.      Nutrition Problem: Inadequate oral intake Etiology: inability to eat      DVT prophylaxis: Lovenox Code Status: Full Family Communication: None Disposition Plan: Discussed with son on phone on 12/29/2018.  He desired to continue the tube feeding.  Consultants: Neurology, palliative medicine  Procedures: Echocardiogram, EEG  Antimicrobials:  Anti-infectives (From admission, onward)   Start     Dose/Rate Route Frequency Ordered Stop   12/12/18 2100  cefTRIAXone (ROCEPHIN) 1 g in sodium chloride 0.9 % 100 mL IVPB  Status:  Discontinued     1 g 200 mL/hr over 30 Minutes Intravenous Every 24 hours 12/12/18 2052 12/15/18 1932      Subjective:  Patient seen and examined the bedside this morning.  Hemodynamically stable.  Currently off NG tube.  Planning for [place]  placement tomorrow. Was sleeping during my evaluation but opens his eyes when I call his name. On 12/29/18 ,he was more alert and awake yesterday and talked to me.He was able to tell where he is and he told me his date of birth and home address.  Objective: Vitals:   12/29/18 2000 12/29/18 2346 12/30/18 0338 12/30/18 0755  BP: (!) 149/72 (!) 159/81 (!) 156/73 138/89  Pulse: 92 81 84 89  Resp: 18 18 18 18   Temp: 98.3 F (36.8 C) 98.2 F (36.8 C) 98.2 F (36.8 C) 98.4 F (36.9 C)  TempSrc: Oral Oral Oral Oral  SpO2:  99% 97% 99%  Weight:        Intake/Output Summary (Last 24 hours) at 12/30/2018 1007 Last data filed at 12/29/2018 2000 Gross per 24 hour  Intake 216.47 ml  Output 525 ml  Net -308.53  ml   Filed Weights   12/27/18 0409 12/28/18 0416 12/29/18 0405  Weight: 118.8 kg 118.8 kg 108 kg    Examination:   General exam:Weak, lying on the bed supine  HEENT: Ear/Nose normal on gross exam Respiratory system: No wheezes or crackles auscultated on the anterior chest Cardiovascular system: S1 & S2 heard, RRR. No JVD, murmurs, rubs, gallops or clicks. Gastrointestinal system: Abdomen is distended, soft and nontender. No organomegaly or masses felt. Normal bowel sounds heard. Central nervous system: Awake and alert this morning but fell back to sleep shortly after.   He was talking.  He could tell his date of birth, home address, name of his son and he knew that he is in the hospital. extremities: No edema, no clubbing ,no cyanosis, distal peripheral pulses palpable. Skin: Pressure ulcers on bilateral feet    Data Reviewed: I have personally reviewed following labs and imaging studies  CBC: Recent Labs  Lab 12/27/18 0423 12/29/18 0320  WBC 13.6* 13.5*  NEUTROABS 10.6* 10.6*  HGB 8.2* 7.6*  HCT 27.9* 26.0*  MCV 78.4* 79.5*  PLT 388 102*   Basic Metabolic Panel: Recent Labs  Lab 12/24/18 0500 12/25/18 0500 12/26/18 0440 12/27/18 0423 12/29/18 0320  NA 132* 135 135 135 137  K 4.9 4.3 3.8 4.0 3.8  CL 100 102 102 103 106  CO2 24 25 24 22 23   GLUCOSE 252* 255* 184* 200* 124*  BUN 11 11 11 13 13   CREATININE 0.68 0.74 0.71 0.77 0.71  CALCIUM 8.5* 8.6* 9.0 9.1 9.1  MG 1.7 1.8  --   --   --   PHOS 2.7 2.7  --   --   --    GFR: Estimated Creatinine Clearance: 99.4 mL/min (by C-G formula based on SCr of 0.71 mg/dL). Liver Function Tests: No results for input(s): AST, ALT, ALKPHOS, BILITOT, PROT, ALBUMIN in the last 168 hours. No results for input(s): LIPASE, AMYLASE in the last 168 hours. No results for input(s): AMMONIA in the last 168 hours. Coagulation Profile: No results for input(s): INR, PROTIME in the last 168 hours. Cardiac Enzymes: No results for  input(s): CKTOTAL, CKMB, CKMBINDEX, TROPONINI in the last 168 hours. BNP (last 3 results) No results for input(s): PROBNP in the last 8760 hours. HbA1C: No results for input(s): HGBA1C in the last 72 hours. CBG: Recent Labs  Lab 12/29/18 1520 12/29/18 2041 12/29/18 2341 12/30/18 0328 12/30/18 0745  GLUCAP 146* 125* 150* 141* 152*   Lipid Profile: No results for input(s): CHOL, HDL, LDLCALC, TRIG, CHOLHDL, LDLDIRECT in the last 72 hours. Thyroid Function Tests: No results for input(s): TSH, T4TOTAL, FREET4, T3FREE, THYROIDAB in the last 72 hours. Anemia Panel: No results for input(s): VITAMINB12, FOLATE, FERRITIN, TIBC, IRON, RETICCTPCT in the last 72 hours. Sepsis Labs: No results for input(s): PROCALCITON, LATICACIDVEN in the last 168 hours.  No results found for this or any previous visit (from the past 240 hour(s)).       Radiology Studies: Dg Chest Port 1 View  Result Date: 12/29/2018 CLINICAL DATA:  Shortness of breath. Aspiration. EXAM: PORTABLE CHEST 1 VIEW COMPARISON:  Chest radiograph 12/12/2018 FINDINGS: Low lung volumes persist. Mild bibasilar atelectasis, similar to prior. No new airspace disease. Unchanged heart size and mediastinal contours. No pulmonary edema, large pleural effusion or pneumothorax. Unchanged osseous structures. IMPRESSION: Persistent low lung volumes with bibasilar atelectasis. No significant change from prior exam. Electronically Signed   By: Keith Rake M.D.   On: 12/29/2018 02:52   Dg Abd Portable 1v  Result Date: 12/28/2018 CLINICAL DATA:  73 year old male with enteric tube placement. EXAM: PORTABLE ABDOMEN - 1 VIEW COMPARISON:  Radiograph dated 12/25/2018 FINDINGS: There has been interval removal of previously seen feeding tube and placement of an enteric tube. The tip of the tube is in the right upper abdomen, likely in the distal stomach. No bowel dilatation. Nonspecific bowel gas pattern. Degenerative changes of the spine. IMPRESSION:  Enteric tube with tip in the distal stomach. Electronically Signed   By: Anner Crete M.D.   On: 12/28/2018 23:21        Scheduled Meds: . amLODipine  10 mg Per Tube Daily  . aspirin  325 mg Per Tube Daily   Or  . aspirin  300 mg Rectal Daily  . chlorhexidine  15 mL Mouth Rinse BID  . dorzolamide  1 drop Both Eyes BID  . enoxaparin (LOVENOX) injection  40 mg Subcutaneous Q24H  . feeding supplement (PRO-STAT SUGAR FREE 64)  30 mL Per Tube Daily  . hydrALAZINE  100 mg Per Tube BID  . insulin aspart  0-9 Units Subcutaneous Q4H  . lisinopril  20 mg Per Tube Daily  . mouth rinse  15 mL Mouth Rinse q12n4p  . metoprolol tartrate  25 mg Per Tube BID  . nicotine  21 mg Transdermal Daily  . polyethylene glycol  17 g Per Tube BID  . senna  1 tablet Per Tube QHS  . sodium chloride flush  10-40 mL Intracatheter Q12H   Continuous Infusions: . dextrose 5 % and 0.9% NaCl 50 mL/hr at 12/29/18 1532  . feeding supplement (JEVITY 1.5 CAL/FIBER) 1,000 mL (12/29/18 0033)  . lacosamide (VIMPAT) IV    . levETIRAcetam       LOS: 18 days    Time spent: 35 mins.More than 50% of that time was spent in counseling and/or coordination of care.      Shelly Coss, MD [facility] Pager [phone]  If 7PM-7AM, please contact night-coverage [email or web address] Password [email or web address] 12/30/2018, 10:07 AM

## 2018-12-30 NOTE — Progress Notes (Signed)
Palliative medicine progress note  Patient seen, chart reviewed.  Patient is alert, continues to be more conversant.  I did get his son on the telephone for him to talk to his father.  Overheard his son, [name], ask his father if he would want a permanent feeding tube and he replied "no".  [name] is also focused on  when he asked his father about CODE STATUS, that his father answered "do everything to save me"  I see improvement in Mr. Eckstein although I doubt that he has full capacity to make complex decisions.  Most of his answers are relevant, in context of simple direct questions.   When I spoke to Mr. Green about feeding options he is requesting that the core track be replaced and is maintaining no PEG tube, but amends that statement with "not unless we have to".  As noted above, he asked his father whether he would want a feeding tube and his father stated no.  Family continues to be in a difficult spot between wrestling with aggressive means such as full CODE STATUS in the setting of poor nutrition, heart failure chronic kidney disease and worsening dementia.  His wife is actually his true healthcare power of attorney versus his children.   Patient Profile:73 y.o.malewith past medical history of dementia, seizure disorder, hypertension, hyperlipidemia, diabetes type 2, history of CVAadmitted on 6/30/2020with failure to thrive picture, and altered mental status, hyper natremia. Code stroke was called on patient as he had significant facial droop. MRI of the brain performed on 12/11/2018 showed no acute processes, no CVA; moderate to severe atrophy as well as microvascular changes. Other imaging reveals advanced intracranial arterial occlusions without eminent large vessel occlusions been present.  Consult ordered for goals of care.   Patient has been having a waxing and waning level of consciousness which is impacting his ability to eat.  He has had temporary measures, such  as a cor track as well as an NG tube; which either became clogged and in the case of the NG tube, he pulled it out.  Family is still verbalizing seeing some improvement and wanting to give him more time to see if his swallow can eventually improve  Plan Palliative medicine to continue to work with patient and family regarding further goals of care specifically artificial feeding. Patient has a very loving family but low healthcare literacy.  I do not believe that Mr. Redditt fully understands aggressive measures that are involved in a full code.  Patient to continue to work with speech therapy on feeding options and recommended diet as he is more alert and able to participate  Prognosis Patient's prognosis is difficult to ascertain.  He certainly is at high risk for an acute aspiration event whether he has a PEG tube or is eating for comfort.  Given his comorbidities even with a PEG tube he would likely meet hospice criteria with a prognosis of less than 6 months, who can support him in the skilled nursing facility, and transition to residential hospice when he declines further  Disposition Back to SNF but question remains whether this will be for "rehab" with palliative medicine to continuing to engage with family and goals of care discussion at the skilled nursing facility versus back to skilled nursing facility with hospice support (patient would likely qualify for his hospice benefit in the facility even if he had a PEG tube  Thank you Romona Curls, NP Total time :30 minutes Greater than 50% of time was  spent in counseling and coordination of care

## 2018-12-31 LAB — CBC WITH DIFFERENTIAL/PLATELET
Abs Immature Granulocytes: 0.04 10*3/uL (ref 0.00–0.07)
Basophils Absolute: 0 10*3/uL (ref 0.0–0.1)
Basophils Relative: 0 %
Eosinophils Absolute: 0.2 10*3/uL (ref 0.0–0.5)
Eosinophils Relative: 2 %
HCT: 28.3 % — ABNORMAL LOW (ref 39.0–52.0)
Hemoglobin: 8.2 g/dL — ABNORMAL LOW (ref 13.0–17.0)
Immature Granulocytes: 0 %
Lymphocytes Relative: 14 %
Lymphs Abs: 1.5 10*3/uL (ref 0.7–4.0)
MCH: 23.5 pg — ABNORMAL LOW (ref 26.0–34.0)
MCHC: 29 g/dL — ABNORMAL LOW (ref 30.0–36.0)
MCV: 81.1 fL (ref 80.0–100.0)
Monocytes Absolute: 0.6 10*3/uL (ref 0.1–1.0)
Monocytes Relative: 6 %
Neutro Abs: 8.4 10*3/uL — ABNORMAL HIGH (ref 1.7–7.7)
Neutrophils Relative %: 78 %
Platelets: 426 10*3/uL — ABNORMAL HIGH (ref 150–400)
RBC: 3.49 MIL/uL — ABNORMAL LOW (ref 4.22–5.81)
RDW: 17.6 % — ABNORMAL HIGH (ref 11.5–15.5)
WBC: 10.8 10*3/uL — ABNORMAL HIGH (ref 4.0–10.5)
nRBC: 0 % (ref 0.0–0.2)

## 2018-12-31 LAB — GLUCOSE, CAPILLARY
Glucose-Capillary: 146 mg/dL — ABNORMAL HIGH (ref 70–99)
Glucose-Capillary: 139 mg/dL — ABNORMAL HIGH (ref 70–99)
Glucose-Capillary: 177 mg/dL — ABNORMAL HIGH (ref 70–99)
Glucose-Capillary: 135 mg/dL — ABNORMAL HIGH (ref 70–99)
Glucose-Capillary: 177 mg/dL — ABNORMAL HIGH (ref 70–99)

## 2018-12-31 NOTE — Progress Notes (Signed)
PROGRESS NOTE    Julian Tyler  SAY:[id number] DOB: 1945/08/17 DOA: 12/11/2018 PCP: Milana Na., MD   Brief Narrative:  Patient is a 73 year old man complex past medical history including stroke with residual right-sided deficits, diabetes mellitus, dementia, recent complex hospitalization where he was treated for seizures and delirium in the setting of dementia who presented to the emergency department 6/30 as a code stroke, reported left facial droop and left arm weakness.  Stat EEG did not show seizure.  MRI negative.  Admitted for acute metabolic encephalopathy, hypernatremia, dehydration, acute kidney injury, possible UTI.  Seen by neurology but no specific neurologic diagnoses made.  Symptoms attributed to hypernatremia.  However the patient has not significantly improved despite correction of hypernatremia.  Hospitalization was complicated by prolonged encephalopathy which waxes and wanes.  Patient unable to swallow and has remained n.p.o.  In discussion with family, feeding tube was temporarily placed and tube feeds initiated.  Patient continues to have waxing and waning of his mental status and is at times awake and alert and follows commands at other times hardly participates with examination. After discussion with the son since last few days, he has expressed his desire to continue tube feeding for now.  Patient remains full code.   Assessment & Plan:   Principal Problem:   Acute metabolic encephalopathy Active Problems:   Goals of care, counseling/discussion   Severe protein-calorie malnutrition (HCC)   Anemia   Palliative care by specialist   Vascular dementia without behavioral disturbance ([place])   Seizure ([place])   Dysphagia   Adult failure to thrive   Type 2 diabetes mellitus without complication (HCC)   Pressure injury of skin   Aspiration into airway   Acute metabolic encephalopathy: Patient has history of baseline dementia.  Acute encephalopathy thought to be  secondary to dehydration/hypernatremia. Sodium was 158 on admission.  Patient was also on multiple psych medications prior to admission.  ABG, ammonia were unremarkable.  EEG unremarkable.  MRI of brain on admission was negative.  Mental status continues to wax and wane. Etiology remains obscure.  Acute worsening of dementia is possibility. He tries to follows commands.  But oriented only to self.  Continues to remain n.p.o.  Seizure disorder: No seizure reported.  EEG did not show any seizure.  Continue Keppra, Vimpat on reduced doses.  If seizure recurs, will increase Keppra back to 1 g twice a day and Vimpat 100 mg twice a day.  Diabetes type 2: Continue sliding-scale insulin .  Continue monitoring CBC  Hypertension: Currently blood pressure stable.  Continue current medicines  History of vascular dementia with behavioral disturbance: Avoid sedatives.  Continue to monitor mental status  Severe protein calorie malnutrition/failure to thrive: Failed his swallowing evaluation.  Family desired feeding tube placement and initiation of temporary tube feeds.  Started tube feed on 7/10. Family wants to continue tube feeding.Speech therapy following to check if he can swallow.  Borderline microcytic anemia: Currently stable.  Continue to follow  Abnormal echocardiogram with possible mobile density on aortic valve: Blood cultures did not show any growth.  TEE deferred due to patient's poor clinical status.  Patient's family also do not want to pursue any further testing.  Positive RPR/Treponema pallidum antibodies: Discussed with ID.  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  ID thought that this most likely false positive test. no indication for current treatment.  Abnormal serum/protein electrophoresis: No M spike. No further evaluation.  Discussed with oncology.  Acute kidney injury/hypernatremia: Resolved  History  of CVA: Has weakness on the right side, slurred speech at baseline  Goals of care:  Multiple discussion held with son.Palliative care following.  Son does not want PEG tube.  Family  Still Anticipating  improvement in the mental status.Alowing  ice chips and sips of water .  Palliative care had extensive discussion with the wife and son on 12/27/18.   After discussion with me since last few days,son has   expressed  desire to continue  the tube feeding , keeping him full code.He still hopes that his father wud be able to swallow at some point.  Disposition: Difficult disposition.  Cannot be discharged to skilled nursing facility with tube feeding.  If he can start swallowing, will discharge him to skilled nursing facility.  If not .[facility] Kitchen[facility]ACH?       Nutrition Problem: Inadequate oral intake Etiology: inability to eat      DVT prophylaxis: Lovenox Code Status: Full Family Communication: None Disposition Plan: Undetermined at this point.Discussed with son on phone on 12/29/2018.  He desired to continue the tube feeding.  Consultants: Neurology, palliative medicine  Procedures: Echocardiogram, EEG  Antimicrobials:  Anti-infectives (From admission, onward)   Start     Dose/Rate Route Frequency Ordered Stop   12/12/18 2100  cefTRIAXone (ROCEPHIN) 1 g in sodium chloride 0.9 % 100 mL IVPB  Status:  Discontinued     1 g 200 mL/hr over 30 Minutes Intravenous Every 24 hours 12/12/18 2052 12/15/18 1932      Subjective:  Patient seen and examined the bedside this morning.  Hemodynamically stable.  More alert and awake today.  Taking sips of water.  Tries to speak but his speech is slurred.  Says that [facility] is in [facility].  Objective: Vitals:   12/30/18 2010 12/30/18 2357 12/31/18 0402 12/31/18 0743  BP: (!) 185/71 (!) 160/72 (!) 168/82 (!) 155/65  Pulse: (!) 105 (!) 103 88 (!) 104  Resp: 20 20 (!) 22 17  Temp: 98.2 F (36.8 C) 98.5 F (36.9 C) 98.6 F (37 C) 98.1 F (36.7 C)  TempSrc: Oral Oral Oral Oral  SpO2: 99% 99% 100% 99%  Weight:         Intake/Output Summary (Last 24 hours) at 12/31/2018 1126 Last data filed at 12/31/2018 0500 Gross per 24 hour  Intake 1062.71 ml  Output 1480 ml  Net -417.29 ml   Filed Weights   12/27/18 0409 12/28/18 0416 12/29/18 0405  Weight: 118.8 kg 118.8 kg 108 kg    Examination:   General exam:Weak, lying on the bed supine,obese  HEENT: Ear/Nose normal on gross exam Respiratory system: No wheezes or crackles auscultated on the anterior chest Cardiovascular system: S1 & S2 heard, RRR. No JVD, murmurs, rubs, gallops or clicks. Gastrointestinal system: Abdomen is distended, soft and nontender. No organomegaly or masses felt. Normal bowel sounds heard. Central nervous system: Awake and alert this morning.Michela Pitcher that he is in a hospital. extremities: No edema, no clubbing ,no cyanosis, distal peripheral pulses palpable. Skin: Pressure ulcers on bilateral feet      Data Reviewed: I have personally reviewed following labs and imaging studies  CBC: Recent Labs  Lab 12/27/18 0423 12/29/18 0320 12/31/18 0432  WBC 13.6* 13.5* 10.8*  NEUTROABS 10.6* 10.6* 8.4*  HGB 8.2* 7.6* 8.2*  HCT 27.9* 26.0* 28.3*  MCV 78.4* 79.5* 81.1  PLT 388 429* 166*   Basic Metabolic Panel: Recent Labs  Lab 12/25/18 0500 12/26/18 0440 12/27/18 0423 12/29/18 0320 12/30/18 1102  NA 135 135 135 137 135  K 4.3 3.8 4.0 3.8 4.9  CL 102 102 103 106 108  CO2 25 24 22 23  18*  GLUCOSE 255* 184* 200* 124* 127*  BUN 11 11 13 13 11   CREATININE 0.74 0.71 0.77 0.71 0.69  CALCIUM 8.6* 9.0 9.1 9.1 8.7*  MG 1.8  --   --   --   --   PHOS 2.7  --   --   --   --    GFR: Estimated Creatinine Clearance: 99.4 mL/min (by C-G formula based on SCr of 0.69 mg/dL). Liver Function Tests: No results for input(s): AST, ALT, ALKPHOS, BILITOT, PROT, ALBUMIN in the last 168 hours. No results for input(s): LIPASE, AMYLASE in the last 168 hours. No results for input(s): AMMONIA in the last 168 hours. Coagulation Profile: No results  for input(s): INR, PROTIME in the last 168 hours. Cardiac Enzymes: No results for input(s): CKTOTAL, CKMB, CKMBINDEX, TROPONINI in the last 168 hours. BNP (last 3 results) No results for input(s): PROBNP in the last 8760 hours. HbA1C: No results for input(s): HGBA1C in the last 72 hours. CBG: Recent Labs  Lab 12/30/18 2013 12/30/18 2029 12/30/18 2323 12/31/18 0407 12/31/18 0750  GLUCAP 120* 121* 129* 146* 139*   Lipid Profile: No results for input(s): CHOL, HDL, LDLCALC, TRIG, CHOLHDL, LDLDIRECT in the last 72 hours. Thyroid Function Tests: No results for input(s): TSH, T4TOTAL, FREET4, T3FREE, THYROIDAB in the last 72 hours. Anemia Panel: No results for input(s): VITAMINB12, FOLATE, FERRITIN, TIBC, IRON, RETICCTPCT in the last 72 hours. Sepsis Labs: No results for input(s): PROCALCITON, LATICACIDVEN in the last 168 hours.  No results found for this or any previous visit (from the past 240 hour(s)).       Radiology Studies: No results found.      Scheduled Meds: . amLODipine  10 mg Per Tube Daily  . aspirin  325 mg Per Tube Daily   Or  . aspirin  300 mg Rectal Daily  . chlorhexidine  15 mL Mouth Rinse BID  . dorzolamide  1 drop Both Eyes BID  . enoxaparin (LOVENOX) injection  40 mg Subcutaneous Q24H  . feeding supplement (PRO-STAT SUGAR FREE 64)  30 mL Per Tube Daily  . hydrALAZINE  100 mg Per Tube BID  . insulin aspart  0-9 Units Subcutaneous Q4H  . lisinopril  20 mg Per Tube Daily  . mouth rinse  15 mL Mouth Rinse q12n4p  . metoprolol tartrate  25 mg Per Tube BID  . nicotine  21 mg Transdermal Daily  . polyethylene glycol  17 g Per Tube BID  . senna  1 tablet Per Tube QHS  . sodium chloride flush  10-40 mL Intracatheter Q12H   Continuous Infusions: . dextrose 5 % and 0.9% NaCl 50 mL/hr at 12/30/18 1800  . feeding supplement (JEVITY 1.5 CAL/FIBER) 1,000 mL (12/29/18 0033)  . lacosamide (VIMPAT) IV 50 mg (12/31/18 1054)  . levETIRAcetam 500 mg (12/30/18  2201)     LOS: 19 days    Time spent: 35 mins.More than 50% of that time was spent in counseling and/or coordination of care.      Shelly Coss, MD [facility] Pager [phone]  If 7PM-7AM, please contact night-coverage [email or web address] Password [email or web address] 12/31/2018, 11:26 AM

## 2018-12-31 NOTE — Progress Notes (Signed)
  Speech Language Pathology Treatment: Dysphagia  Patient Details Name: Julian Tyler MRN: [id number] DOB: 28-Feb-1946 Today's Date: 12/31/2018 Time: 7672-0947 SLP Time Calculation (min) (ACUTE ONLY): 20 min  Assessment / Plan / Recommendation Clinical Impression  Patient seen to address dysphagia goals with trials of thin liquids via straw sips and bites of puree (ice cream). He was very alert and appropriately communicated and interacted with SLP, at end saying, "that's enough of that" when declining further PO's of ice cream. He continues to exhibit swallow initiation delay with liquids and solids, however has demonstrated seemingly incremental improvement in swallow initiation and oral bolus control and clearance of PO's.  If he continues to demonstrate consistent alertness during the day, may consider trial of full PO diet.     HPI HPI: Pt is a 73 y.o. male, w hypertension, hyperlipidemia, dm2, h/o stroke , seizure, dementia, who presented with altered mental status. Per nurse, pt had right side deficit, not able to walk by self, ? Slight slurred speech. Pt recently had seroquel , xanax, and depakote added due to combativeness. MRI of the brain was negative. Acute metabolic encephalopathy suspect to be secondary to to dehydration and hypernatremia. Urinalysis indicated possible infection; he was started on antibiotics.       SLP Plan  Continue with current plan of care       Recommendations  Diet recommendations: NPO(nectar liquids, puree solids for pleasure, ice chips, water for pleasure) Medication Administration: Crushed with puree Supervision: Full supervision/cueing for compensatory strategies;Staff to assist with self feeding Compensations: Minimize environmental distractions;Slow rate;Small sips/bites Postural Changes and/or Swallow Maneuvers: Seated upright 90 degrees                Oral Care Recommendations: Oral care BID Follow up Recommendations: Skilled Nursing  facility;24 hour supervision/assistance SLP Visit Diagnosis: Dysphagia, oropharyngeal phase (R13.12) Plan: Continue with current plan of care       GO                Julian Tyler 12/31/2018, 3:23 PM   Julian Baller, MA, [facility] Pager: [phone]

## 2018-12-31 NOTE — Plan of Care (Signed)
Progressing towards discharge 

## 2018-12-31 NOTE — Progress Notes (Signed)
Nutrition Follow-up  DOCUMENTATION CODES:   Obesity unspecified  INTERVENTION:  Continue Jevity 1.5 formula via Cortrak NGT at goal rate of 27m/hr.  Continue 368mProstat once daily.   Tube feeding regimen provides1900kcal (100% of needs),90grams of protein, and 91236mf H2O.  NUTRITION DIAGNOSIS:   Inadequate oral intake related to inability to eat as evidenced by NPO status; ongoing  GOAL:   Patient will meet greater than or equal to 90% of their needs; met with TF  MONITOR:   PO intake, Diet advancement, TF tolerance, Weight trends, Labs, I & O's  REASON FOR ASSESSMENT:   NPO/Clear Liquid Diet, Low Braden    ASSESSMENT:   [age]ar old male with a history of previous stroke, hypertension, diabetes, seizure disorder, dementia who is a resident of a skilled nursing facility, was brought to the hospital with worsening mental status.  Found to be significantly dehydrated, hypernatremic, with acute kidney injury.  Family decided to continue tube feeding for patient. Pt full code. Per MD, pt continues to wax and wan. Pt remains NPO status. SLP following and working with pt regarding swallowing. Family hopeful pt swallow will improve. RD to continue to monitor for tube feeding tolerance.   Labs and medications reviewed.   Diet Order:   Diet Order            Diet NPO time specified Except for: Ice Chips, Sips with Meds, Other (See Comments)  Diet effective now              EDUCATION NEEDS:   Not appropriate for education at this time  Skin:  Skin Assessment: Skin Integrity Issues: Skin Integrity Issues:: DTI DTI: R heel  Last BM:  7/19  Height:   Ht Readings from Last 1 Encounters:  12/11/18 _0  (1.727 m)    Weight:   Wt Readings from Last 1 Encounters:  12/29/18 108 kg    Ideal Body Weight:  70 kg  BMI:  Body mass index is 36.19 kg/m.  Estimated Nutritional Needs:   Kcal:  1900-2100  Protein:  90-110 grams  Fluid:  1.9 - 2.1  L/day    SteCorrin ParkerS, RD, LDN Pager # [phone] hours/ weekend pager # [phone]

## 2018-12-31 NOTE — Procedures (Signed)
Cortrak  Tube Type:  Cortrak - 43 inches Tube Location:  Left nare Initial Placement:  Stomach Secured by: Bridle Technique Used to Measure Tube Placement:  Documented cm marking at nare/ corner of mouth Cortrak Secured At:  73 cm Procedure Comments:  Tube located just inside pyloris     Cortrak Tube Team Note:  Consult received to place a Cortrak feeding tube.   No x-ray is required. RN may begin using tube.   If the tube becomes dislodged please keep the tube and contact the Cortrak team at [email or web address] (password [email or web address]) for replacement.  If after hours and replacement cannot be delayed, place a NG tube and confirm placement with an abdominal x-ray.    Koleen Distance MS, RD, LDN Pager #- [phone] Office#- [phone] After Hours Pager: [phone]

## 2018-12-31 NOTE — Progress Notes (Signed)
Palliative medicine progress note  73 year old man complex past medical history including stroke with residual right-sided deficits, diabetes mellitus, dementia, recent complex hospitalization where he was treated for seizures and delirium in the setting of dementia who presented to the emergency department 6/30 as a code stroke, reported left facial droop and left arm weakness.   Continued physical, functional and cognitive decline in spite of medical interventions  Day 19 of hospital stay.   Currently patient is lethargic and speech is brief and garbled.  Intermittently follows simple commands.   Core-trac feedings are continuing and  being tolerated.  Patient pulled core track out over the weekend and it was replaced at family request. Also at family request trials of sips and chips continue.  Family remains hopeful that he would tolerate some p.o.'s.  Patient remains high risk for aspiration and it is unlikely he will be able to support himself nutritionally or from a hydration standpoint, now or at any time in the future.  I spoke to Mr. Green/son again regarding current medical situation.  I stressed the importance of making a decision regarding artificial nutrition.  I discussed the risks and benefits of a PEG tube.  I shared with family that a core track was a temporary intervention.   There is no documented H POA therefore the wife Adonis Huguenin would be the main decision maker.  Mr. Darel Hong tells me that Audelia Hives has empowered him to represent the family and make decisions.   LAST WEEK I was able to speak to both wife/Vivian Padmanabhan and son/Joseph Green on the phone together this morning.  The patient's wife Adonis Huguenin clearly verbalizes that she gives permission and fully supports any decision made by the patient's son Mr. [name].   Continued conversation regarding the patient's multiple comorbidities and the fact that he is failing to thrive and the regardless of medical interventions  the patient's prognosis is likely weeks to months.  He is hospice eligible.  PMT will continue to support holistically    Disposition To be determined   Family tell me they are expecting dispostion to SNF  Discussed with Dr. Tawanna Solo   Total Time:  20 minutes  Greater than 50%  of this time was spent counseling and coordinating care related to the above assessment and plan.  Wadie Lessen NP  Palliative Medicine Team Team Phone # [phone] Pager  [phone]

## 2018-12-31 NOTE — Progress Notes (Signed)
CSW contacted by MD to assess if patient is a candidate for LTACH. CSW reviewed and discussed case with RNCM, and patient does not meet criteria for LTACH admission at this time due to lack of medical intensity.  Laveda Abbe, [place] Clinical Social Worker [phone]

## 2019-01-01 LAB — CREATININE, SERUM
Creatinine, Ser: 0.93 mg/dL (ref 0.61–1.24)
GFR calc Af Amer: >60 mL/min (ref >60)
GFR calc non Af Amer: >60 mL/min (ref >60)

## 2019-01-01 LAB — GLUCOSE, CAPILLARY
Glucose-Capillary: 145 mg/dL — ABNORMAL HIGH (ref 70–99)
Glucose-Capillary: 156 mg/dL — ABNORMAL HIGH (ref 70–99)
Glucose-Capillary: 180 mg/dL — ABNORMAL HIGH (ref 70–99)
Glucose-Capillary: 185 mg/dL — ABNORMAL HIGH (ref 70–99)
Glucose-Capillary: 186 mg/dL — ABNORMAL HIGH (ref 70–99)
Glucose-Capillary: 203 mg/dL — ABNORMAL HIGH (ref 70–99)

## 2019-01-01 NOTE — Progress Notes (Signed)
Occupational Therapy Treatment Patient Details Name: Julian Tyler MRN: [id number] DOB: 19-Nov-1945 Today's Date: 01/01/2019    History of present illness Pt is a 73 y/o nursing home resident who presents with AMS. MRI negative, and acute metabolic encephalopathy suspected to be 2 dehydration and hypernatremia. Possible UTI as well. PMH: stroke, CKD, Dry eyes, DM, hyperlipidemia   OT comments  Pt limited this session by fatigue and incontinent BM. Pt required total A +2 for bed mobility for hygiene. Pt able to attend to oral care task seated upright in bed. Visual cueing provided and seemingly improved pt attention to task. Pt still requiring heavy cueing for following commands overall. SNF remains appropriate d/c location.    Follow Up Recommendations  SNF    Equipment Recommendations  None recommended by OT       Precautions / Restrictions Precautions Precautions: Fall Restrictions Weight Bearing Restrictions: No       Mobility Bed Mobility Overal bed mobility: Needs Assistance Bed Mobility: Rolling Rolling: Total assist;+2 for physical assistance         General bed mobility comments: Assist for all aspects of rolling, scooting to [facility] and repositioning.   Transfers                 General transfer comment: Pt is appropriate for transition OOB to chair with maxi-move with nursing staff. Recommend Geomat for chair if getting OOB.     Balance       Sitting balance - Comments: Unable to assess                                   ADL either performed or assessed with clinical judgement   ADL Overall ADL's : Needs assistance/impaired     Grooming: Bed level;Minimal assistance Grooming Details (indicate cue type and reason): Cueing for attention to task, mirror used for increasing visual attention to task     Lower Body Bathing: Total assistance;Bed level;+2 for physical assistance Lower Body Bathing Details (indicate cue type and reason): Pt  was incontinent of BM                                       Cognition Arousal/Alertness: Lethargic Behavior During Therapy: Flat affect Overall Cognitive Status: Difficult to assess Area of Impairment: Attention;Memory;Following commands;Awareness;Safety/judgement;Problem solving                 Orientation Level: Disoriented to;Time;Situation Current Attention Level: Focused Memory: Decreased recall of precautions;Decreased short-term memory Following Commands: Follows one step commands inconsistently;Follows one step commands with increased time Safety/Judgement: Decreased awareness of safety;Decreased awareness of deficits Awareness: Intellectual Problem Solving: Slow processing;Decreased initiation;Difficulty sequencing;Requires verbal cues;Requires tactile cues          Exercises Exercises: Other exercises Other Exercises Other Exercises: active reaching activity x2 with L hand Other Exercises: Grooming activity with toothbrush and mouth swab - see OT note for more detail.       General Comments Pt declining attempting transfer to EOB or to stand    Pertinent Vitals/ Pain       Pain Assessment: Faces Faces Pain Scale: Hurts a little bit Pain Location: R knee during passive flexion Pain Descriptors / Indicators: Aching Pain Intervention(s): Monitored during session         Frequency  Min 2X/week  Progress Toward Goals  OT Goals(current goals can now be found in the care plan section)  Progress towards OT goals: Progressing toward goals  Acute Rehab OT Goals Patient Stated Goal: unable to state goals  OT Goal Formulation: Patient unable to participate in goal setting Time For Goal Achievement: 01/05/19 Potential to Achieve Goals: [place] Discharge plan remains appropriate    Co-evaluation    PT/OT/SLP Co-Evaluation/Treatment: Yes Reason for Co-Treatment: Complexity of the patient's impairments (multi-system  involvement);To address functional/ADL transfers;For patient/therapist safety PT goals addressed during session: Mobility/safety with mobility;Strengthening/ROM;Balance OT goals addressed during session: ADL's and self-care      AM-PAC OT "6 Clicks" Daily Activity     Outcome Measure   Help from another person eating meals?: Total Help from another person taking care of personal grooming?: A Lot Help from another person toileting, which includes using toliet, bedpan, or urinal?: Total Help from another person bathing (including washing, rinsing, drying)?: Total Help from another person to put on and taking off regular upper body clothing?: Total Help from another person to put on and taking off regular lower body clothing?: Total 6 Click Score: 7    End of Session    OT Visit Diagnosis: Unsteadiness on feet (R26.81);Muscle weakness (generalized) (M62.81);Other symptoms and signs involving cognitive function;Adult, failure to thrive (R62.7)   Activity Tolerance Patient limited by fatigue;Patient limited by lethargy   Patient Left in bed;with call bell/phone within reach;with bed alarm set;with SCD's reapplied   Nurse Communication Mobility status;Other (comment)(removal of condom cath)        Time: 0998-3382 OT Time Calculation (min): 36 min  Charges: OT General Charges $OT Visit: 1 Visit OT Treatments $Self Care/Home Management : 8-22 mins   Curtis Sites OTR/L  01/01/2019, 3:08 PM

## 2019-01-01 NOTE — Plan of Care (Signed)
  Problem: Activity: Goal: Risk for activity intolerance will decrease Outcome: Progressing   Problem: Coping: Goal: Level of anxiety will decrease Outcome: Progressing   Problem: Pain Managment: Goal: General experience of comfort will improve Outcome: Progressing   Problem: Education: Goal: Knowledge of disease or condition will improve Outcome: Progressing Goal: Knowledge of secondary prevention will improve Outcome: Progressing Goal: Knowledge of patient specific risk factors addressed and post discharge goals established will improve Outcome: Progressing   Problem: Nutrition: Goal: Risk of aspiration will decrease Outcome: Progressing Goal: Dietary intake will improve Outcome: Progressing   Problem: Nutrition Goal: Nutritional status is improving Description: Monitor and assess patient for malnutrition (ex- brittle hair, bruises, dry skin, pale skin and conjunctiva, muscle wasting, smooth red tongue, and disorientation). Collaborate with interdisciplinary team and initiate plan and interventions as ordered.  Monitor patient's weight and dietary intake as ordered or per policy. Utilize nutrition screening tool and intervene per policy. Determine patient's food preferences and provide high-protein, high-caloric foods as appropriate.  Outcome: Progressing   Julian Tyler, BSN, RN

## 2019-01-01 NOTE — Progress Notes (Signed)
PROGRESS NOTE    Manual Navarra  QIH:[id number] DOB: May 30, 1946 DOA: 12/11/2018 PCP: Milana Na., MD   Brief Narrative:  Patient is a 73 year old man complex past medical history including stroke with residual right-sided deficits, diabetes mellitus, dementia, recent complex hospitalization where he was treated for seizures and delirium in the setting of dementia who presented to the emergency department 6/30 as a code stroke, reported left facial droop and left arm weakness.  Stat EEG did not show seizure.  MRI negative.  Admitted for acute metabolic encephalopathy, hypernatremia, dehydration, acute kidney injury, possible UTI.  Seen by neurology but no specific neurologic diagnoses made.  Symptoms attributed to hypernatremia.  However the patient has not significantly improved despite correction of hypernatremia.  Hospitalization was complicated by prolonged encephalopathy which waxes and wanes.  Patient unable to swallow and has remained n.p.o.  In discussion with family, feeding tube was temporarily placed and tube feeds initiated.  Patient continues to have waxing and waning of his mental status and is at times awake and alert and follows commands at other times hardly participates with examination. After discussion with the son since last few days, he has expressed his desire to continue tube feeding for now.  Patient remains full code.  7/21:Palliative care will discuss today with the son about PEG tube placement.We will suggest that he should be either DNR/comfort care or full code with PEG tube   Assessment & Plan:   Principal Problem:   Acute metabolic encephalopathy Active Problems:   Goals of care, counseling/discussion   Severe protein-calorie malnutrition ([place])   Anemia   Palliative care by specialist   Vascular dementia without behavioral disturbance ([place])   Seizure ([place])   Dysphagia   Adult failure to thrive   Type 2 diabetes mellitus without complication ([place])   Pressure injury of skin   Aspiration into airway   Acute metabolic encephalopathy: Patient has history of baseline dementia.  Acute encephalopathy thought to be secondary to dehydration/hypernatremia. Sodium was 158 on admission.  Patient was also on multiple psych medications prior to admission.  ABG, ammonia were unremarkable.  EEG unremarkable.  MRI of brain on admission was negative.  Mental status continues to wax and wane. Etiology remains obscure.  Acute worsening of dementia is possibility. He tries to follows commands.  But oriented only to self.  Continues to remain n.p.o.  Seizure disorder: No seizure reported.  EEG did not show any seizure.  Continue Keppra, Vimpat on reduced doses.  If seizure recurs, will increase Keppra back to 1 g twice a day and Vimpat 100 mg twice a day.  Diabetes type 2: Continue sliding-scale insulin .  Continue monitoring CBC  Hypertension: Currently blood pressure stable.  Continue current medicines  History of vascular dementia with behavioral disturbance: Avoid sedatives.  Continue to monitor mental status  Severe protein calorie malnutrition/failure to thrive: Failed his swallowing evaluation.  Family desired feeding tube placement and initiation of temporary tube feeds.  Started tube feed on 7/10. Family wants to continue tube feeding.Speech therapy following to check if he can swallow.  Borderline microcytic anemia: Currently stable.  Continue to follow  Abnormal echocardiogram with possible mobile density on aortic valve: Blood cultures did not show any growth.  TEE deferred due to patient's poor clinical status.  Patient's family also do not want to pursue any further testing.  Positive RPR/Treponema pallidum antibodies: Discussed with ID.  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  ID thought that this most likely false positive  test. no indication for current treatment.  Abnormal serum/protein electrophoresis: No M spike. No further evaluation.   Discussed with oncology.  Acute kidney injury/hypernatremia: Resolved  History of CVA: Has weakness on the right side, slurred speech at baseline  Goals of care: Multiple discussion held with son.Palliative care following.  Son does not want PEG tube.  Family  Still Anticipating  improvement in the mental status.Alowing  ice chips and sips of water .  Palliative care had extensive discussion with the wife and son on 12/27/18.   After discussion with me since last few days,son has   expressed  desire to continue  the tube feeding , keeping him full code.He still hopes that his father wud be able to swallow at some point. Palliative care will discuss today with the son about PEG tube placement.We will suggest that he should be either DNR/comfort care or full code with PEG tube   Disposition: Difficult disposition.  Cannot be discharged to skilled nursing facility with tube feeding. He needs to have PEG tube .    Nutrition Problem: Inadequate oral intake Etiology: inability to eat      DVT prophylaxis: Lovenox Code Status: Full Family Communication: None Disposition Plan: Undetermined at this point.Discussed with son on phone on 12/29/2018.  He desired to continue the tube feeding.  Consultants: Neurology, palliative medicine  Procedures: Echocardiogram, EEG  Antimicrobials:  Anti-infectives (From admission, onward)   Start     Dose/Rate Route Frequency Ordered Stop   12/12/18 2100  cefTRIAXone (ROCEPHIN) 1 g in sodium chloride 0.9 % 100 mL IVPB  Status:  Discontinued     1 g 200 mL/hr over 30 Minutes Intravenous Every 24 hours 12/12/18 2052 12/15/18 1932      Subjective:  Patient seen and examined the bedside this morning.  Hemodynamically stable.  Looks comfortable.  Awake, alert, eyes open.  Told the  currently year as 2020.  Objective: Vitals:   12/31/18 2333 01/01/19 0348 01/01/19 0433 01/01/19 0815  BP: (!) 152/81 (!) 168/83  (!) 148/69  Pulse: 95 (!) 108  (!) 106   Resp: 17 16  (!) 25  Temp: 98.8 F (37.1 C) 98.7 F (37.1 C)  98.7 F (37.1 C)  TempSrc: Oral Oral  Oral  SpO2: 100% 100%  100%  Weight:   98.4 kg     Intake/Output Summary (Last 24 hours) at 01/01/2019 1127 Last data filed at 01/01/2019 0333 Gross per 24 hour  Intake 1136 ml  Output 680 ml  Net 456 ml   Filed Weights   12/28/18 0416 12/29/18 0405 01/01/19 0433  Weight: 118.8 kg 108 kg 98.4 kg    Examination:  General exam: Weak, lying on the bed supine, obese  HEENT:Ear/nose normal on gross exam Respiratory system: No wheezes or crackles heard on anterior chest Cardiovascular system: S1 & S2 heard, RRR. No JVD, murmurs, rubs, gallops or clicks. Gastrointestinal system: Abdomen is distended, soft and nontender. No organomegaly or masses felt. Normal bowel sounds heard. Central nervous system: Awake, alert, eyes open this morning.  Oriented to place.  Told the year correctly  extremities: No edema, no clubbing ,no cyanosis, distal peripheral pulses palpable.  Some skin breakdowns on bilateral foot Skin: No rashes, lesions\,no icterus ,no pallor    Data Reviewed: I have personally reviewed following labs and imaging studies  CBC: Recent Labs  Lab 12/27/18 0423 12/29/18 0320 12/31/18 0432  WBC 13.6* 13.5* 10.8*  NEUTROABS 10.6* 10.6* 8.4*  HGB 8.2* 7.6* 8.2*  HCT 27.9* 26.0* 28.3*  MCV 78.4* 79.5* 81.1  PLT 388 429* 948*   Basic Metabolic Panel: Recent Labs  Lab 12/26/18 0440 12/27/18 0423 12/29/18 0320 12/30/18 1102 01/01/19 0416  NA 135 135 137 135  --   K 3.8 4.0 3.8 4.9  --   CL 102 103 106 108  --   CO2 24 22 23  18*  --   GLUCOSE 184* 200* 124* 127*  --   BUN 11 13 13 11   --   CREATININE 0.71 0.77 0.71 0.69 0.93  CALCIUM 9.0 9.1 9.1 8.7*  --    GFR: Estimated Creatinine Clearance: 81.6 mL/min (by C-G formula based on SCr of 0.93 mg/dL). Liver Function Tests: No results for input(s): AST, ALT, ALKPHOS, BILITOT, PROT, ALBUMIN in the last 168 hours.  No results for input(s): LIPASE, AMYLASE in the last 168 hours. No results for input(s): AMMONIA in the last 168 hours. Coagulation Profile: No results for input(s): INR, PROTIME in the last 168 hours. Cardiac Enzymes: No results for input(s): CKTOTAL, CKMB, CKMBINDEX, TROPONINI in the last 168 hours. BNP (last 3 results) No results for input(s): PROBNP in the last 8760 hours. HbA1C: No results for input(s): HGBA1C in the last 72 hours. CBG: Recent Labs  Lab 12/31/18 1632 12/31/18 2051 01/01/19 0005 01/01/19 0415 01/01/19 0749  GLUCAP 135* 177* 185* 186* 203*   Lipid Profile: No results for input(s): CHOL, HDL, LDLCALC, TRIG, CHOLHDL, LDLDIRECT in the last 72 hours. Thyroid Function Tests: No results for input(s): TSH, T4TOTAL, FREET4, T3FREE, THYROIDAB in the last 72 hours. Anemia Panel: No results for input(s): VITAMINB12, FOLATE, FERRITIN, TIBC, IRON, RETICCTPCT in the last 72 hours. Sepsis Labs: No results for input(s): PROCALCITON, LATICACIDVEN in the last 168 hours.  No results found for this or any previous visit (from the past 240 hour(s)).       Radiology Studies: No results found.      Scheduled Meds: . amLODipine  10 mg Per Tube Daily  . aspirin  325 mg Per Tube Daily   Or  . aspirin  300 mg Rectal Daily  . chlorhexidine  15 mL Mouth Rinse BID  . dorzolamide  1 drop Both Eyes BID  . enoxaparin (LOVENOX) injection  40 mg Subcutaneous Q24H  . feeding supplement (PRO-STAT SUGAR FREE 64)  30 mL Per Tube Daily  . hydrALAZINE  100 mg Per Tube BID  . insulin aspart  0-9 Units Subcutaneous Q4H  . lisinopril  20 mg Per Tube Daily  . mouth rinse  15 mL Mouth Rinse q12n4p  . metoprolol tartrate  25 mg Per Tube BID  . nicotine  21 mg Transdermal Daily  . polyethylene glycol  17 g Per Tube BID  . senna  1 tablet Per Tube QHS  . sodium chloride flush  10-40 mL Intracatheter Q12H   Continuous Infusions: . dextrose 5 % and 0.9% NaCl 50 mL/hr at 12/30/18 1800   . feeding supplement (JEVITY 1.5 CAL/FIBER) 1,000 mL (12/31/18 1213)  . lacosamide (VIMPAT) IV 50 mg (01/01/19 1045)  . levETIRAcetam 500 mg (01/01/19 0919)     LOS: 20 days    Time spent: 35 mins.More than 50% of that time was spent in counseling and/or coordination of care.      Shelly Coss, MD [facility] Pager [phone]  If 7PM-7AM, please contact night-coverage [email or web address] Password [facility] 01/01/2019, 11:27 AM

## 2019-01-01 NOTE — Progress Notes (Signed)
Chart reviewed for LOS; B Durwood Dittus RN,MHA,BSN Advanced Care Supervisor [phone] 

## 2019-01-01 NOTE — Progress Notes (Signed)
Physical Therapy Treatment Patient Details Name: Julian Tyler  MRN: [id number] DOB: August 27, 1945 Today's Date: 01/01/2019    History of Present Illness Pt is a 73 y/o nursing home resident who presents with AMS. MRI negative, and acute metabolic encephalopathy suspected to be 2 dehydration and hypernatremia. Possible UTI as well. PMH: stroke, CKD, Dry eyes, DM, hyperlipidemia    PT Comments    Pt sleeping when PT arrived but was able to be roused enough to participate somewhat. Focus of session was cleaning patient up as he had a bowel movement in the bed, changing linen, and ADL task with OT. Pt continues to require +2 total assist for any attempts at mobility. After cleaning was complete, pt refused to participate with any EOB activity, so ADL task was initiated with toothbrush. Will continue to follow and progress as able per POC.     Follow Up Recommendations  SNF;Supervision/Assistance - 24 hour     Equipment Recommendations  None recommended by PT    Recommendations for Other Services Rehab consult     Precautions / Restrictions Precautions Precautions: None Restrictions Weight Bearing Restrictions: No    Mobility  Bed Mobility Overal bed mobility: Needs Assistance Bed Mobility: Rolling Rolling: Total assist;+2 for physical assistance         General bed mobility comments: Assist for all aspects of rolling, scooting to [facility] and repositioning.   Transfers                 General transfer comment: Pt is appropriate for transition OOB to chair with maxi-move with nursing staff. Recommend Geomat for chair if getting OOB.   Ambulation/Gait                 Stairs             Wheelchair Mobility    Modified Rankin (Stroke Patients Only)       Balance       Sitting balance - Comments: Unable to assess                                    Cognition Arousal/Alertness: Lethargic Behavior During Therapy: Flat affect Overall  Cognitive Status: Difficult to assess Area of Impairment: Attention;Memory;Following commands;Awareness;Safety/judgement;Problem solving                   Current Attention Level: Focused Memory: Decreased recall of precautions;Decreased short-term memory Following Commands: Follows one step commands inconsistently;Follows one step commands with increased time Safety/Judgement: Decreased awareness of safety;Decreased awareness of deficits Awareness: Intellectual Problem Solving: Slow processing;Decreased initiation;Difficulty sequencing;Requires verbal cues;Requires tactile cues        Exercises Other Exercises Other Exercises: active reaching activity x2 with L hand Other Exercises: Grooming activity with toothbrush and mouth swab - see OT note for more detail.     General Comments        Pertinent Vitals/Pain Pain Assessment: Faces Faces Pain Scale: Hurts even more Pain Location: R knee during passive flexion Pain Descriptors / Indicators: Aching Pain Intervention(s): Monitored during session    Home Living                      Prior Function            PT Goals (current goals can now be found in the care plan section) Acute Rehab PT Goals Patient Stated Goal: unable to state goals  PT  Goal Formulation: Patient unable to participate in goal setting Time For Goal Achievement: 01/03/19 Potential to Achieve Goals: Fair Progress towards PT goals: Progressing toward goals    Frequency    Min 2X/week      PT Plan Current plan remains appropriate    Co-evaluation PT/OT/SLP Co-Evaluation/Treatment: Yes Reason for Co-Treatment: Complexity of the patient's impairments (multi-system involvement);Necessary to address cognition/behavior during functional activity;For patient/therapist safety;To address functional/ADL transfers PT goals addressed during session: Mobility/safety with mobility;Strengthening/ROM;Balance        AM-PAC PT "6 Clicks"  Mobility   Outcome Measure  Help needed turning from your back to your side while in a flat bed without using bedrails?: Total Help needed moving from lying on your back to sitting on the side of a flat bed without using bedrails?: Total Help needed moving to and from a bed to a chair (including a wheelchair)?: Total Help needed standing up from a chair using your arms (e.g., wheelchair or bedside chair)?: Total Help needed to walk in hospital room?: Total Help needed climbing 3-5 steps with a railing? : Total 6 Click Score: 6    End of Session   Activity Tolerance: Patient limited by lethargy Patient left: in bed;with call bell/phone within reach;with bed alarm set Nurse Communication: Mobility status PT Visit Diagnosis: Other abnormalities of gait and mobility (R26.89);Muscle weakness (generalized) (M62.81)     Time: 0454-0981 PT Time Calculation (min) (ACUTE ONLY): 36 min  Charges:  $Therapeutic Activity: 8-22 mins                     Julian Tyler, PT, Julian Tyler Acute [facility] Pager: [phone] Office: [phone]    Julian Tyler 01/01/2019, 2:25 PM

## 2019-01-01 NOTE — Care Management Important Message (Signed)
Important Message  Patient Details  Name: Julian Tyler MRN: [id number] Date of Birth: 1946/02/22   Medicare Important Message Given:  Yes     Orbie Pyo 01/01/2019, 3:25 PM

## 2019-01-02 ENCOUNTER — Inpatient Hospital Stay (HOSPITAL_COMMUNITY): Payer: Medicare Other

## 2019-01-02 LAB — GLUCOSE, CAPILLARY
Glucose-Capillary: 150 mg/dL — ABNORMAL HIGH (ref 70–99)
Glucose-Capillary: 154 mg/dL — ABNORMAL HIGH (ref 70–99)
Glucose-Capillary: 172 mg/dL — ABNORMAL HIGH (ref 70–99)
Glucose-Capillary: 172 mg/dL — ABNORMAL HIGH (ref 70–99)
Glucose-Capillary: 173 mg/dL — ABNORMAL HIGH (ref 70–99)
Glucose-Capillary: 209 mg/dL — ABNORMAL HIGH (ref 70–99)

## 2019-01-02 NOTE — Progress Notes (Signed)
PROGRESS NOTE    Julian Tyler  CZY:[id number] DOB: 1946/05/27 DOA: 12/11/2018 PCP: Milana Na., MD   Brief Narrative:  Patient is a 73 year old man complex past medical history including stroke with residual right-sided deficits, diabetes mellitus, dementia, recent complex hospitalization where he was treated for seizures and delirium in the setting of dementia who presented to the emergency department 6/30 as a code stroke, reported left facial droop and left arm weakness.  Stat EEG did not show seizure.  MRI negative.  Admitted for acute metabolic encephalopathy, hypernatremia, dehydration, acute kidney injury, possible UTI.  Seen by neurology but no specific neurologic diagnoses made.  Symptoms attributed to hypernatremia.  However the patient has not significantly improved despite correction of hypernatremia.  Hospitalization was complicated by prolonged encephalopathy which waxes and wanes.  Patient unable to swallow and has remained n.p.o.  In discussion with family, feeding tube was temporarily placed and tube feeds initiated.  Patient continues to have waxing and waning of his mental status and is at times awake and alert and follows commands at other times hardly participates with examination. After discussion with the son since last few days, he has expressed his desire to continue tube feeding for now.  Patient remains full code.  7/21: I discussed with the son, [name], yesterday on phone.  I updated the current situation.  He said he will discuss with patient's wife about DNR/comfort care or full code/PEG tube.  Palliative care also discussing with the son today.  Anticipate decision today.  Assessment & Plan:   Principal Problem:   Acute metabolic encephalopathy Active Problems:   Goals of care, counseling/discussion   Severe protein-calorie malnutrition (HCC)   Anemia   Palliative care by specialist   Vascular dementia without behavioral disturbance ([place])   Seizure  ([place])   Dysphagia   Adult failure to thrive   Type 2 diabetes mellitus without complication (HCC)   Pressure injury of skin   Aspiration into airway   Acute metabolic encephalopathy: Patient has history of baseline dementia.  Acute encephalopathy thought to be secondary to dehydration/hypernatremia. Sodium was 158 on admission.  Patient was also on multiple psych medications prior to admission.  ABG, ammonia were unremarkable.  EEG unremarkable.  MRI of brain on admission was negative.  Mental status continues to wax and wane. Etiology remains obscure.  Acute worsening of dementia is possibility. He tries to follows commands.  But oriented only to self.  Continues to remain n.p.o.  Seizure disorder: No seizure reported.  EEG did not show any seizure.  Continue Keppra, Vimpat on reduced doses.  If seizure recurs, will increase Keppra back to 1 g twice a day and Vimpat 100 mg twice a day.  Diabetes type 2: Continue sliding-scale insulin .  Continue monitoring CBC  Hypertension: Currently blood pressure stable.  Continue current medicines  History of vascular dementia with behavioral disturbance: Avoid sedatives.  Continue to monitor mental status  Severe protein calorie malnutrition/failure to thrive: Failed his swallowing evaluation.  Family desired feeding tube placement and initiation of temporary tube feeds.  Started tube feed on 7/10. Family wants to continue tube feeding.Speech therapy following to check if he can swallow.  Borderline microcytic anemia: Currently stable.  Continue to follow  Abnormal echocardiogram with possible mobile density on aortic valve: Blood cultures did not show any growth.  TEE deferred due to patient's poor clinical status.  Patient's family also do not want to pursue any further testing.  Positive RPR/Treponema pallidum antibodies: Discussed  with ID.  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  ID thought that this most likely false positive test. no indication for  current treatment.  Abnormal serum/protein electrophoresis: No M spike. No further evaluation.  Discussed with oncology.  Acute kidney injury/hypernatremia: Resolved  History of CVA: Has weakness on the right side, slurred speech at baseline  Goals of care: Multiple discussion held with son.Palliative care following.  Son does not want PEG tube.  Family  Still Anticipating  improvement in the mental status.Alowing  ice chips and sips of water .  Palliative care had extensive discussion with the wife and son on 12/27/18.   Palliative care will discuss today with the son about PEG tube placement.We will suggest that he should be either DNR/comfort care or full code with PEG tube  Disposition: Difficult disposition.  Cannot be discharged to skilled nursing facility with tube feeding. He needs to have PEG tube .    Nutrition Problem: Inadequate oral intake Etiology: inability to eat      DVT prophylaxis: Lovenox Code Status: Full Family Communication: None Disposition Plan: Undetermined at this point.Discussed with son on phone on 12/31/2018.  He said he will discuss with patient's wife CODE STATUS and feeding options.  Consultants: Neurology, palliative medicine  Procedures: Echocardiogram, EEG  Antimicrobials:  Anti-infectives (From admission, onward)   Start     Dose/Rate Route Frequency Ordered Stop   12/12/18 2100  cefTRIAXone (ROCEPHIN) 1 g in sodium chloride 0.9 % 100 mL IVPB  Status:  Discontinued     1 g 200 mL/hr over 30 Minutes Intravenous Every 24 hours 12/12/18 2052 12/15/18 1932      Subjective:  Seen and examined the bedside this morning.  Found to be hypertensive.  Mild grade fever this morning.  No obvious  source of infection.  Eyes open.  Awake.  Confused.  No significant change from yesterday.  Objective: Vitals:   01/01/19 2153 01/01/19 2342 01/02/19 0432 01/02/19 0823  BP: (!) 154/63 (!) 125/54 (!) 156/66 (!) 166/93  Pulse: (!) 107 91 100 (!) 114  Resp:   18 (!) 22 20  Temp:  99.9 F (37.7 C) 98.5 F (36.9 C) (!) 100.6 F (38.1 C)  TempSrc:  Oral Oral Oral  SpO2:  100% 100% 100%  Weight:   118.8 kg     Intake/Output Summary (Last 24 hours) at 01/02/2019 0930 Last data filed at 01/02/2019 0349 Gross per 24 hour  Intake 812 ml  Output -  Net 812 ml   Filed Weights   12/29/18 0405 01/01/19 0433 01/02/19 0432  Weight: 108 kg 98.4 kg 118.8 kg    Examination:  General exam: Generalized weakness, weak, lying in the bed supine, obese  HEENT:Oral mucosa moist, Ear/Nose normal on gross exam Respiratory system: No wheezes or crackles heard on the anterior chest Cardiovascular system: S1 & S2 heard, RRR. No JVD, murmurs, rubs, gallops or clicks. Gastrointestinal system: Abdomen is distended, soft and nontender. No organomegaly or masses felt. Normal bowel sounds heard. Central nervous system: Awake.  Try to follow command.Oriented to self  extremities: Trace edema, no clubbing ,no cyanosis, distal peripheral pulses palpable.  Some skin breakdowns on bilateral foot Skin: No rashes, lesions or ulcers,no icterus ,no pallor   Data Reviewed: I have personally reviewed following labs and imaging studies  CBC: Recent Labs  Lab 12/27/18 0423 12/29/18 0320 12/31/18 0432  WBC 13.6* 13.5* 10.8*  NEUTROABS 10.6* 10.6* 8.4*  HGB 8.2* 7.6* 8.2*  HCT 27.9* 26.0*  28.3*  MCV 78.4* 79.5* 81.1  PLT 388 429* 122*   Basic Metabolic Panel: Recent Labs  Lab 12/27/18 0423 12/29/18 0320 12/30/18 1102 01/01/19 0416  NA 135 137 135  --   K 4.0 3.8 4.9  --   CL 103 106 108  --   CO2 22 23 18*  --   GLUCOSE 200* 124* 127*  --   BUN 13 13 11   --   CREATININE 0.77 0.71 0.69 0.93  CALCIUM 9.1 9.1 8.7*  --    GFR: Estimated Creatinine Clearance: 90 mL/min (by C-G formula based on SCr of 0.93 mg/dL). Liver Function Tests: No results for input(s): AST, ALT, ALKPHOS, BILITOT, PROT, ALBUMIN in the last 168 hours. No results for input(s): LIPASE,  AMYLASE in the last 168 hours. No results for input(s): AMMONIA in the last 168 hours. Coagulation Profile: No results for input(s): INR, PROTIME in the last 168 hours. Cardiac Enzymes: No results for input(s): CKTOTAL, CKMB, CKMBINDEX, TROPONINI in the last 168 hours. BNP (last 3 results) No results for input(s): PROBNP in the last 8760 hours. HbA1C: No results for input(s): HGBA1C in the last 72 hours. CBG: Recent Labs  Lab 01/01/19 1603 01/01/19 2009 01/02/19 0019 01/02/19 0431 01/02/19 0912  GLUCAP 156* 180* 173* 154* 172*   Lipid Profile: No results for input(s): CHOL, HDL, LDLCALC, TRIG, CHOLHDL, LDLDIRECT in the last 72 hours. Thyroid Function Tests: No results for input(s): TSH, T4TOTAL, FREET4, T3FREE, THYROIDAB in the last 72 hours. Anemia Panel: No results for input(s): VITAMINB12, FOLATE, FERRITIN, TIBC, IRON, RETICCTPCT in the last 72 hours. Sepsis Labs: No results for input(s): PROCALCITON, LATICACIDVEN in the last 168 hours.  No results found for this or any previous visit (from the past 240 hour(s)).       Radiology Studies: No results found.      Scheduled Meds: . amLODipine  10 mg Per Tube Daily  . aspirin  325 mg Per Tube Daily   Or  . aspirin  300 mg Rectal Daily  . chlorhexidine  15 mL Mouth Rinse BID  . dorzolamide  1 drop Both Eyes BID  . enoxaparin (LOVENOX) injection  40 mg Subcutaneous Q24H  . feeding supplement (PRO-STAT SUGAR FREE 64)  30 mL Per Tube Daily  . hydrALAZINE  100 mg Per Tube BID  . insulin aspart  0-9 Units Subcutaneous Q4H  . lisinopril  20 mg Per Tube Daily  . mouth rinse  15 mL Mouth Rinse q12n4p  . metoprolol tartrate  25 mg Per Tube BID  . nicotine  21 mg Transdermal Daily  . polyethylene glycol  17 g Per Tube BID  . senna  1 tablet Per Tube QHS  . sodium chloride flush  10-40 mL Intracatheter Q12H   Continuous Infusions: . feeding supplement (JEVITY 1.5 CAL/FIBER) 1,000 mL (12/31/18 1213)  . lacosamide  (VIMPAT) IV 50 mg (01/01/19 2217)  . levETIRAcetam 500 mg (01/01/19 2156)     LOS: 21 days    Time spent: 35 mins.More than 50% of that time was spent in counseling and/or coordination of care.      Shelly Coss, MD [facility] Pager [phone]  If 7PM-7AM, please contact night-coverage [email or web address] Password [email or web address] 01/02/2019, 9:30 AM

## 2019-01-02 NOTE — Plan of Care (Signed)
  Problem: Activity: Goal: Risk for activity intolerance will decrease 01/02/2019 0124 by Lennox Grumbles, RN Outcome: Progressing 01/02/2019 0123 by Lennox Grumbles, RN Outcome: Progressing   Problem: Coping: Goal: Level of anxiety will decrease 01/02/2019 0124 by Lennox Grumbles, RN Outcome: Progressing 01/02/2019 0123 by Lennox Grumbles, RN Outcome: Progressing   Problem: Pain Managment: Goal: General experience of comfort will improve 01/02/2019 0124 by Lennox Grumbles, RN Outcome: Progressing 01/02/2019 0123 by Lennox Grumbles, RN Outcome: Progressing   Problem: Nutrition: Goal: Risk of aspiration will decrease 01/02/2019 0124 by Lennox Grumbles, RN Outcome: Progressing 01/02/2019 0123 by Lennox Grumbles, RN Outcome: Progressing Goal: Dietary intake will improve 01/02/2019 0124 by Lennox Grumbles, RN Outcome: Progressing 01/02/2019 0123 by Lennox Grumbles, RN Outcome: Progressing   Problem: Nutrition Goal: Nutritional status is improving Description: Monitor and assess patient for malnutrition (ex- brittle hair, bruises, dry skin, pale skin and conjunctiva, muscle wasting, smooth red tongue, and disorientation). Collaborate with interdisciplinary team and initiate plan and interventions as ordered.  Monitor patient's weight and dietary intake as ordered or per policy. Utilize nutrition screening tool and intervene per policy. Determine patient's food preferences and provide high-protein, high-caloric foods as appropriate.  01/02/2019 0124 by Lennox Grumbles, RN Outcome: Progressing 01/02/2019 0123 by Lennox Grumbles, RN Outcome: Progressing

## 2019-01-02 NOTE — Progress Notes (Signed)
Still intermittent confusion throughout this shift, but pt has had more sleep hour this shift than previous night.

## 2019-01-02 NOTE — Progress Notes (Signed)
SLP Cancellation Note  Patient Details Name: Julian Tyler MRN: [id number] DOB: [birth year]   Cancelled treatment:       Reason Eval/Treat Not Completed: Patient's level of consciousness;Other (comment)(receiving RN care). Per today's notes and RN, patient has been sleeping all day. Currently, he is receiving RN care and has been irritable. SLP will follow next date if able.    Nadara Mode Tarrell 01/02/2019, 4:51 PM   [name], MA, [facility] Pager: [phone]

## 2019-01-02 NOTE — Progress Notes (Signed)
Palliative medicine progress note  73 year old man complex past medical history including stroke with residual right-sided deficits, diabetes mellitus, dementia, recent complex hospitalization where he was treated for seizures and delirium in the setting of dementia who presented to the emergency department 6/30 as a code stroke, reported left facial droop and left arm weakness.   Continued physical, functional and cognitive decline in spite of medical interventions  Day 21 of hospital stay.   Currently patient is lethargic and speech is brief and garbled.  Intermittently follows simple commands.   Core-trac feedings are continuing and  being tolerated however trials of sips and chips are minimal.  Family remains hopeful that he would tolerate some p.o.'s.  Patient remains high risk for aspiration and it is unlikely he will be able to support himself nutritionally or from a hydration standpoint, now or at any time in the future.  Attending/Dr Tawanna Solo spoke to Mr Carlota Raspberry today and family request PEG placement and all offered and available medical interventions to prolong life.   I was able to speak spoke to Mr. Green/son again regarding current medical situation.  (best time to reach is after 5pm)  Again I shared by concerns regarding the patient's multiple comorbidities and the fact that he is failing to thrive and the regardless of medical interventions the patient's prognosis is likely weeks to months.  He is hospice eligible.  PMT will continue to support holistically    Disposition  Family is requesting a SNF in the [facility] area in discharge. They will refused placement at [facility]--I will discuss with SW in the morning.   Discussed with Dr. Tawanna Solo   Total Time:  25 minutes  Greater than 50%  of this time was spent counseling and coordinating care related to the above assessment and plan.  Wadie Lessen NP  Palliative Medicine Team Team Phone # [phone] Pager   [phone]

## 2019-01-02 NOTE — Plan of Care (Signed)
Progressing towards discharge 

## 2019-01-03 LAB — CBC WITH DIFFERENTIAL/PLATELET
Abs Immature Granulocytes: 0.06 10*3/uL (ref 0.00–0.07)
Basophils Absolute: 0.1 10*3/uL (ref 0.0–0.1)
Basophils Relative: 0 %
Eosinophils Absolute: 0.4 10*3/uL (ref 0.0–0.5)
Eosinophils Relative: 3 %
HCT: 27.7 % — ABNORMAL LOW (ref 39.0–52.0)
Hemoglobin: 7.9 g/dL — ABNORMAL LOW (ref 13.0–17.0)
Immature Granulocytes: 0 %
Lymphocytes Relative: 14 %
Lymphs Abs: 2 10*3/uL (ref 0.7–4.0)
MCH: 23.2 pg — ABNORMAL LOW (ref 26.0–34.0)
MCHC: 28.5 g/dL — ABNORMAL LOW (ref 30.0–36.0)
MCV: 81.5 fL (ref 80.0–100.0)
Monocytes Absolute: 0.9 10*3/uL (ref 0.1–1.0)
Monocytes Relative: 7 %
Neutro Abs: 10.6 10*3/uL — ABNORMAL HIGH (ref 1.7–7.7)
Neutrophils Relative %: 76 %
Platelets: 349 10*3/uL (ref 150–400)
RBC: 3.4 MIL/uL — ABNORMAL LOW (ref 4.22–5.81)
RDW: 17.5 % — ABNORMAL HIGH (ref 11.5–15.5)
WBC: 14 10*3/uL — ABNORMAL HIGH (ref 4.0–10.5)
nRBC: 0 % (ref 0.0–0.2)

## 2019-01-03 LAB — BASIC METABOLIC PANEL
Anion gap: 10 (ref 5–15)
BUN: 14 mg/dL (ref 8–23)
CO2: 23 mmol/L (ref 22–32)
Calcium: 8.9 mg/dL (ref 8.9–10.3)
Chloride: 108 mmol/L (ref 98–111)
Creatinine, Ser: 0.58 mg/dL — ABNORMAL LOW (ref 0.61–1.24)
GFR calc Af Amer: >60 mL/min (ref >60)
GFR calc non Af Amer: >60 mL/min (ref >60)
Glucose, Bld: 167 mg/dL — ABNORMAL HIGH (ref 70–99)
Potassium: 3.6 mmol/L (ref 3.5–5.1)
Sodium: 141 mmol/L (ref 135–145)

## 2019-01-03 LAB — GLUCOSE, CAPILLARY
Glucose-Capillary: 151 mg/dL — ABNORMAL HIGH (ref 70–99)
Glucose-Capillary: 158 mg/dL — ABNORMAL HIGH (ref 70–99)
Glucose-Capillary: 170 mg/dL — ABNORMAL HIGH (ref 70–99)
Glucose-Capillary: 170 mg/dL — ABNORMAL HIGH (ref 70–99)
Glucose-Capillary: 171 mg/dL — ABNORMAL HIGH (ref 70–99)
Glucose-Capillary: 197 mg/dL — ABNORMAL HIGH (ref 70–99)

## 2019-01-03 NOTE — Progress Notes (Signed)
  Speech Language Pathology Treatment: Dysphagia  Patient Details Name: Julian Tyler MRN: [id number] DOB: 1945-11-15 Today's Date: 01/03/2019 Time: 3007-6226 SLP Time Calculation (min) (ACUTE ONLY): 21 min  Assessment / Plan / Recommendation Clinical Impression  Patient was alert when entering room. Trials of thin and nectar liquids and purees. Julian Tyler had significant anterior oral spillage, throat clear and delayed cough across consistencies. Wet vocal quality.  Continue NPO status. Currently has NG for nutrition and family has requested a PEG placement for long term nutrition. PEG scheduled for 7/27. Julian Tyler continues to have fever. Recommend strict NPO status upon today's observations.    HPI HPI: Julian Tyler is a 73 y.o. male, w hypertension, hyperlipidemia, dm2, h/o stroke , seizure, dementia, who presented with altered mental status. Per nurse, Julian Tyler had right side deficit, not able to walk by self, ? Slight slurred speech. Julian Tyler recently had seroquel , xanax, and depakote added due to combativeness. MRI of the brain was negative. Acute metabolic encephalopathy suspect to be secondary to to dehydration and hypernatremia. Urinalysis indicated possible infection; Julian Tyler was started on antibiotics.       SLP Plan  Continue with current plan of care       Recommendations  Diet recommendations: NPO(for comfort Julian Tyler may have nectar liquids and purees, alert) Liquids provided via: Cup Medication Administration: Via alternative means Supervision: Full supervision/cueing for compensatory strategies;Staff to assist with self feeding Compensations: Minimize environmental distractions;Slow rate;Small sips/bites Postural Changes and/or Swallow Maneuvers: Seated upright 90 degrees                Oral Care Recommendations: Oral care BID Follow up Recommendations: Skilled Nursing facility;24 hour supervision/assistance SLP Visit Diagnosis: Dysphagia, oropharyngeal phase (R13.12) Plan: Continue with current plan of  care       Admire, MA, CCC-SLP 01/03/2019 12:05 PM

## 2019-01-03 NOTE — Consult Note (Signed)
Chief Complaint: Patient was seen in consultation today for percutaneous gastric tube placement Chief Complaint  Patient presents with   Code Stroke   at the request of Dr Dessie Coma   Supervising Physician: Sandi Mariscal  Patient Status: [facility] - In-pt  History of Present Illness: Julian Tyler is a 73 y.o. male   Code Stroke 12/11/18 Dementia Encephalopathy  Long hospital stay  Deconditioning; FTT Protein calorie malnutrition High aspiration risk per Speech Path  Request for percutaneous gastric tube placement  Images reviewed with Dr Vira Blanco procedure  Pt with spike in temp 7/22: 100.8 Still 99.3 today Leukocytosis -Wbc up to 14 from 10.8 ( 7/20)  Will schedule for perc G tube tentatively Mon 7/27  Past Medical History:  Diagnosis Date   Cataract    Chorioretinal scar of left eye    Diabetes ([place])    Dry eyes    Glaucoma    HLD (hyperlipidemia)    HTN (hypertension)    Stroke ([place])     No past surgical history on file.  Allergies: Hctz [hydrochlorothiazide], Sulfa drugs cross reactors, Zithromax [azithromycin], and Robaxin [methocarbamol]  Medications: Prior to Admission medications   Medication Sig Start Date End Date Taking? Authorizing Provider  ALPRAZolam Duanne Moron) 0.5 MG tablet Take 1 tablet (0.5 mg total) by mouth 2 (two) times daily. 11/26/18  Yes Hennie Duos, MD  amLODipine (NORVASC) 10 MG tablet Take 10 mg by mouth daily.   Yes [provider]  aspirin EC 81 MG tablet Take 81 mg by mouth daily.   Yes [provider]  cloNIDine (CATAPRES - DOSED IN MG/24 HR) 0.3 mg/24hr patch Place 1 patch onto the skin once a week last placed on 11/18/2018   Yes [provider]  divalproex (DEPAKOTE) 250 MG DR tablet Take 250 mg by mouth 2 (two) times daily.    Yes [provider]  dorzolamide (TRUSOPT) 2 % ophthalmic solution Place 1 drop into both eyes 2 (two) times daily.   Yes [provider]   ferrous sulfate 325 (65 FE) MG tablet Take 325 mg by mouth daily with breakfast.   Yes [provider]  hydrALAZINE (APRESOLINE) 100 MG tablet Take 100 mg by mouth 2 (two) times daily.   Yes [provider]  Insulin Glargine (LANTUS SOLOSTAR) 100 UNIT/ML Solostar Pen Inject 30 Units into the skin at bedtime.    Yes [provider]  insulin lispro (HUMALOG) 100 UNIT/ML cartridge Inject 5 units into skin three times a day with meals   Yes [provider]  Lacosamide (VIMPAT) 100 MG TABS Take 100 mg by mouth 2 (two) times a day.    Yes [provider]  lisinopril (ZESTRIL) 20 MG tablet Take 20 mg by mouth daily.   Yes [provider]  metFORMIN (GLUCOPHAGE-XR) 500 MG 24 hr tablet Take 500 mg by mouth daily with breakfast.   Yes [provider]  metoprolol succinate (TOPROL-XL) 25 MG 24 hr tablet Take 25 mg by mouth daily. For HTN   Yes [provider]  nicotine ([employer] NICOTINE) 21 mg/24hr patch Place 21 mg onto the skin daily. Place 1 patch onto skin daily Remove old patch prior to placement of new patch   Yes [provider]  potassium chloride (K-DUR) 10 MEQ tablet Take 10 mEq by mouth daily.   Yes [provider]  pravastatin (PRAVACHOL) 40 MG tablet Take 40 mg by mouth daily.   Yes [provider]  QUEtiapine (SEROQUEL) 50 MG tablet Take 50 mg by mouth 2 (two) times daily.    Yes [provider]  levETIRAcetam (KEPPRA) 1000 MG tablet Take 1,000 mg by mouth 2 (two) times daily.    [provider]  Melatonin 3 MG TABS Take 3 mg by mouth at bedtime as needed (sleep).     [provider]  Nutritional Supplement LIQD Take 120 mLs by mouth 2 (two) times a day.    [provider]     No family history on file.  Social History   Socioeconomic History   Marital status: Married    Spouse name: Not on file   Number of children: Not on file   Years of education: Not  on file   Highest education level: Not on file  Occupational History   Not on file  Social Needs   Financial resource strain: Not on file   Food insecurity    Worry: Not on file    Inability: Not on file   Transportation needs    Medical: Not on file    Non-medical: Not on file  Tobacco Use   Smoking status: Never Smoker   Smokeless tobacco: Current User    Types: Chew  Substance and Sexual Activity   Alcohol use: Yes    Comment: "some beer"   Drug use: Not on file   Sexual activity: Not on file  Lifestyle   Physical activity    Days per week: Not on file    Minutes per session: Not on file   Stress: Not on file  Relationships   Social connections    Talks on phone: Not on file    Gets together: Not on file    Attends religious service: Not on file    Active member of club or organization: Not on file    Attends meetings of clubs or organizations: Not on file    Relationship status: Not on file  Other Topics Concern   Not on file  Social History Narrative   No significant surgical or family history.  Current user of smokeless tobacco - chew.  Nonsmoker.    Review of Systems: A 12 point ROS discussed and pertinent positives are indicated in the HPI above.  All other systems are negative.  Review of Systems  Constitutional: Positive for activity change and fever.  Respiratory: Negative for shortness of breath.   Neurological: Positive for weakness.  Psychiatric/Behavioral: Positive for confusion and decreased concentration.    Vital Signs: BP (!) 154/63    Pulse 98    Temp 99.3 F (37.4 C) (Oral)    Resp 20    Wt 262 lb (118.8 kg)    SpO2 100%    BMI 39.84 kg/m   Physical Exam Vitals signs reviewed.  Constitutional:      Appearance: He is ill-appearing.  Cardiovascular:     Rate and Rhythm: Normal rate and regular rhythm.     Heart sounds: Normal heart sounds.  Pulmonary:     Breath sounds: Normal breath sounds.  Abdominal:     General:  Bowel sounds are normal.  Musculoskeletal:        General: No swelling.     Right lower leg: No edema.     Left lower leg: No edema.  Skin:    General: Skin is warm and dry.  Psychiatric:     Comments: Calling family for consent     Imaging: Ct Abdomen Wo Contrast  Result  Date: 01/03/2019 CLINICAL DATA:  Evaluate anatomy prior to potential percutaneous gastrostomy tube placement EXAM: CT ABDOMEN WITHOUT CONTRAST TECHNIQUE: Multidetector CT imaging of the abdomen was performed following the standard protocol without IV contrast. COMPARISON:  CT abdomen pelvis-02/12/2016 FINDINGS: Evaluation of solid abdominal organs is degraded secondary to lack of intravenous contrast. Lower chest: Limited visualization of the lower thorax is negative for focal airspace opacity or pleural effusion. Normal heart size. Coronary artery calcifications. Trace amount of pericardial fluid, presumably physiologic. Hepatobiliary: Normal hepatic contour. Normal noncontrast appearance of the gallbladder given degree distention. No radiopaque gallstones. No ascites. Pancreas: Normal noncontrast appearance of the pancreas. Spleen: Normal noncontrast appearance of the spleen. Adrenals/Urinary Tract: Moderate amount of grossly symmetric likely age and body habitus related perinephric stranding. No evidence of urinary obstruction. No renal stones. There is mild thickening the bilateral adrenal glands without discrete nodule. The urinary bladder was not imaged. Stomach/Bowel: Enteric tube tip terminates within the duodenal bulb. There is no interposed hepatic parenchyma or transverse colon between the anterior wall of the distal aspect of the mid body of the stomach and ventral wall of the upper abdomen (image 26, series 3) and this percutaneous window would likely be improved with gaseous distention of the stomach. Tiny hiatal hernia. Moderate colonic stool burden without evidence of enteric obstruction. Normal noncontrast appearance  of the imaged portions of the appendix. No pneumoperitoneum, pneumatosis or portal venous gas. Vascular/Lymphatic: Atherosclerotic plaque within the abdominal aorta. No bulky retroperitoneal or mesenteric lymphadenopathy. Other: Diffuse body wall anasarca. Musculoskeletal: No acute or aggressive osseous abnormalities. Stigmata of DISH within the thoracic spine. IMPRESSION: 1. Gastric anatomy amenable to potential percutaneous gastrostomy tube placement as indicated. 2. Tiny hiatal hernia. 3. Coronary calcifications.  Aortic Atherosclerosis (ICD10-I70.0). Electronically Signed   By: Sandi Mariscal M.D.   On: 01/03/2019 07:41   Ct Angio Head W Or Wo Contrast  Result Date: 12/11/2018 CLINICAL DATA:  Left facial droop and left arm weakness. EXAM: CT ANGIOGRAPHY HEAD AND NECK CT PERFUSION BRAIN TECHNIQUE: Multidetector CT imaging of the head and neck was performed using the standard protocol during bolus administration of intravenous contrast. Multiplanar CT image reconstructions and MIPs were obtained to evaluate the vascular anatomy. Carotid stenosis measurements (when applicable) are obtained utilizing NASCET criteria, using the distal internal carotid diameter as the denominator. Multiphase CT imaging of the brain was performed following IV bolus contrast injection. Subsequent parametric perfusion maps were calculated using RAPID software. CONTRAST:  111mL OMNIPAQUE IOHEXOL 350 MG/ML SOLN COMPARISON:  Brain MRI 11/07/2018.  No prior angiographic imaging. FINDINGS: CTA NECK FINDINGS Aortic arch: Standard 3 vessel aortic arch with mild-to-moderate atherosclerotic plaque. No significant arch vessel origin stenosis. Right carotid system: Patent with mild-to-moderate predominantly calcified plaque in the distal common and proximal internal carotid arteries without evidence of significant stenosis or dissection. Left carotid system: Patent with extensive calcified plaque in the mid and distal common carotid artery  resulting in 55% stenosis. Mild calcified plaque in the proximal ICA without significant stenosis. Vertebral arteries: Patent and dominant right vertebral artery with calcified plaque at its origin not resulting in significant stenosis. Diminutive left vertebral artery with severe origin stenosis and poor visualization of the remainder of the V1 segment due to venous contrast. Patent but diffusely small and irregular left V2 and V3 segments. Skeleton: Moderate cervical spondylosis. Other neck: No evidence of cervical lymphadenopathy or mass. Upper chest: Clear lung apices. Review of the MIP images confirms the above findings CTA HEAD FINDINGS Anterior circulation:  The internal carotid arteries are patent from skull base to carotid termini with extensive calcified plaque resulting in mild cavernous and moderate supraclinoid stenoses bilaterally. ACAs and MCAs are patent without evidence of proximal branch occlusion or significant A1 or M1 stenosis. There are prominent branch vessel atherosclerotic changes including multiple severe M2 and A2 stenoses. No aneurysm is identified. Posterior circulation: The intracranial right vertebral artery is patent with mild atherosclerotic irregularity but no significant stenosis. The left V4 segment is occluded distal to the PICA origin, likely chronic based on the prior MRI. The basilar artery is patent with mild atherosclerotic irregularity but no flow limiting stenosis. Patent SCA is are seen bilaterally. There is a small right posterior communicating artery. Both PCAs are patent with diffuse atherosclerotic irregularity. There are mild right P1 and severe mid right P2 stenoses. No aneurysm is identified. Venous sinuses: As permitted by contrast timing, patent. Anatomic variants: None. Review of the MIP images confirms the above findings CT Brain Perfusion Findings: ASPECTS: 10 CBF (<30%) Volume: 20mL Perfusion (Tmax>6.0s) volume: 13mL Mismatch Volume: 7mL Infarction Location:None  IMPRESSION: 1. No emergent large vessel occlusion. 2. 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 3. Patent right vertebral artery without significant stenosis. 4. 55% left common carotid artery stenosis. 5. Advanced intracranial atherosclerosis including moderate bilateral ICA stenoses and multiple severe anterior and posterior circulation branch vessel stenoses. 6. Negative CT perfusion imaging. These results were communicated to Dr. Erlinda Hong at 4:00 pmon 12/11/2018 by text page via the [facility] messaging system. Electronically Signed   By: Logan Bores M.D.   On: 12/11/2018 17:58   Ct Angio Neck W Or Wo Contrast  Result Date: 12/11/2018 CLINICAL DATA:  Left facial droop and left arm weakness. EXAM: CT ANGIOGRAPHY HEAD AND NECK CT PERFUSION BRAIN TECHNIQUE: Multidetector CT imaging of the head and neck was performed using the standard protocol during bolus administration of intravenous contrast. Multiplanar CT image reconstructions and MIPs were obtained to evaluate the vascular anatomy. Carotid stenosis measurements (when applicable) are obtained utilizing NASCET criteria, using the distal internal carotid diameter as the denominator. Multiphase CT imaging of the brain was performed following IV bolus contrast injection. Subsequent parametric perfusion maps were calculated using RAPID software. CONTRAST:  158mL OMNIPAQUE IOHEXOL 350 MG/ML SOLN COMPARISON:  Brain MRI 11/07/2018.  No prior angiographic imaging. FINDINGS: CTA NECK FINDINGS Aortic arch: Standard 3 vessel aortic arch with mild-to-moderate atherosclerotic plaque. No significant arch vessel origin stenosis. Right carotid system: Patent with mild-to-moderate predominantly calcified plaque in the distal common and proximal internal carotid arteries without evidence of significant stenosis or dissection. Left carotid system: Patent with extensive calcified plaque in the mid and distal common carotid artery resulting in 55%  stenosis. Mild calcified plaque in the proximal ICA without significant stenosis. Vertebral arteries: Patent and dominant right vertebral artery with calcified plaque at its origin not resulting in significant stenosis. Diminutive left vertebral artery with severe origin stenosis and poor visualization of the remainder of the V1 segment due to venous contrast. Patent but diffusely small and irregular left V2 and V3 segments. Skeleton: Moderate cervical spondylosis. Other neck: No evidence of cervical lymphadenopathy or mass. Upper chest: Clear lung apices. Review of the MIP images confirms the above findings CTA HEAD FINDINGS Anterior circulation: The internal carotid arteries are patent from skull base to carotid termini with extensive calcified plaque resulting in mild cavernous and moderate supraclinoid stenoses bilaterally. ACAs and MCAs are patent without evidence of proximal branch occlusion or significant  A1 or M1 stenosis. There are prominent branch vessel atherosclerotic changes including multiple severe M2 and A2 stenoses. No aneurysm is identified. Posterior circulation: The intracranial right vertebral artery is patent with mild atherosclerotic irregularity but no significant stenosis. The left V4 segment is occluded distal to the PICA origin, likely chronic based on the prior MRI. The basilar artery is patent with mild atherosclerotic irregularity but no flow limiting stenosis. Patent SCA is are seen bilaterally. There is a small right posterior communicating artery. Both PCAs are patent with diffuse atherosclerotic irregularity. There are mild right P1 and severe mid right P2 stenoses. No aneurysm is identified. Venous sinuses: As permitted by contrast timing, patent. Anatomic variants: None. Review of the MIP images confirms the above findings CT Brain Perfusion Findings: ASPECTS: 10 CBF (<30%) Volume: 14mL Perfusion (Tmax>6.0s) volume: 37mL Mismatch Volume: 14mL Infarction Location:None IMPRESSION: 1.  No emergent large vessel occlusion. 2. 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 3. Patent right vertebral artery without significant stenosis. 4. 55% left common carotid artery stenosis. 5. Advanced intracranial atherosclerosis including moderate bilateral ICA stenoses and multiple severe anterior and posterior circulation branch vessel stenoses. 6. Negative CT perfusion imaging. These results were communicated to Dr. Erlinda Hong at 4:00 pmon 12/11/2018 by text page via the [facility] messaging system. Electronically Signed   By: Logan Bores M.D.   On: 12/11/2018 17:58   Mr Brain Wo Contrast  Result Date: 12/11/2018 CLINICAL DATA:  73 y/o  M; TIA, initial exam. EXAM: MRI HEAD WITHOUT CONTRAST TECHNIQUE: Axial DWI, axial T2 FLAIR, axial T2 propeller, and axial SWI sequences were acquired. Patient was unable to continue and additional sequences were not acquired. COMPARISON:  None. FINDINGS: Brain: No reduced diffusion to suggest acute or early subacute infarction. Nonspecific confluent foci of T2 FLAIR hyperintense signal abnormality are present in subcortical and periventricular white matter compatible with moderate to severe chronic microvascular ischemic changes. There is moderate to severe volume loss the brain. No focal mass effect, extra-axial collection, hydrocephalus, or herniation identified. Motion degraded SWI sequence. Vascular: Normal flow voids. Skull and upper cervical spine: Normal marrow signal. Sinuses/Orbits: Negative. Other: None. IMPRESSION: Axial DWI, T2 FLAIR, T2, and SWI sequences were acquired. Motion degraded SWI sequence. 1. No acute stroke, mass effect, or extra-axial collection identified. 2. Moderate to severe chronic microvascular ischemic changes and volume loss of the brain. Electronically Signed   By: Kristine Garbe M.D.   On: 12/11/2018 22:42   Nm Pulmonary Perfusion  Result Date: 12/12/2018 CLINICAL DATA:  Elevated D-dimer, mental status  changes, question pulmonary embolism, history hypertension, diabetes mellitus EXAM: NUCLEAR MEDICINE PERFUSION LUNG SCAN TECHNIQUE: Perfusion images were obtained in multiple projections after intravenous injection of radiopharmaceutical. Ventilation scans intentionally deferred if perfusion scan and chest x-ray adequate for interpretation during COVID 19 epidemic. RADIOPHARMACEUTICALS:  1.5 mCi Tc-57m MAA IV COMPARISON:  None Correlation: Chest radiograph 12/11/2018 FINDINGS: Minimal diminished perfusion in LEFT lower lobe. No other perfusion abnormalities identified. Chest radiograph demonstrates LEFT basilar atelectasis, greater than suggested by perfusion scan. Findings represent a very low probability for pulmonary embolism. Ventilation exam not required. IMPRESSION: Very low probability for pulmonary embolism. Electronically Signed   By: Lavonia Dana M.D.   On: 12/12/2018 13:07   Ct Cerebral Perfusion W Contrast  Result Date: 12/11/2018 CLINICAL DATA:  Left facial droop and left arm weakness. EXAM: CT ANGIOGRAPHY HEAD AND NECK CT PERFUSION BRAIN TECHNIQUE: Multidetector CT imaging of the head and neck was performed using the standard  protocol during bolus administration of intravenous contrast. Multiplanar CT image reconstructions and MIPs were obtained to evaluate the vascular anatomy. Carotid stenosis measurements (when applicable) are obtained utilizing NASCET criteria, using the distal internal carotid diameter as the denominator. Multiphase CT imaging of the brain was performed following IV bolus contrast injection. Subsequent parametric perfusion maps were calculated using RAPID software. CONTRAST:  187mL OMNIPAQUE IOHEXOL 350 MG/ML SOLN COMPARISON:  Brain MRI 11/07/2018.  No prior angiographic imaging. FINDINGS: CTA NECK FINDINGS Aortic arch: Standard 3 vessel aortic arch with mild-to-moderate atherosclerotic plaque. No significant arch vessel origin stenosis. Right carotid system: Patent with  mild-to-moderate predominantly calcified plaque in the distal common and proximal internal carotid arteries without evidence of significant stenosis or dissection. Left carotid system: Patent with extensive calcified plaque in the mid and distal common carotid artery resulting in 55% stenosis. Mild calcified plaque in the proximal ICA without significant stenosis. Vertebral arteries: Patent and dominant right vertebral artery with calcified plaque at its origin not resulting in significant stenosis. Diminutive left vertebral artery with severe origin stenosis and poor visualization of the remainder of the V1 segment due to venous contrast. Patent but diffusely small and irregular left V2 and V3 segments. Skeleton: Moderate cervical spondylosis. Other neck: No evidence of cervical lymphadenopathy or mass. Upper chest: Clear lung apices. Review of the MIP images confirms the above findings CTA HEAD FINDINGS Anterior circulation: The internal carotid arteries are patent from skull base to carotid termini with extensive calcified plaque resulting in mild cavernous and moderate supraclinoid stenoses bilaterally. ACAs and MCAs are patent without evidence of proximal branch occlusion or significant A1 or M1 stenosis. There are prominent branch vessel atherosclerotic changes including multiple severe M2 and A2 stenoses. No aneurysm is identified. Posterior circulation: The intracranial right vertebral artery is patent with mild atherosclerotic irregularity but no significant stenosis. The left V4 segment is occluded distal to the PICA origin, likely chronic based on the prior MRI. The basilar artery is patent with mild atherosclerotic irregularity but no flow limiting stenosis. Patent SCA is are seen bilaterally. There is a small right posterior communicating artery. Both PCAs are patent with diffuse atherosclerotic irregularity. There are mild right P1 and severe mid right P2 stenoses. No aneurysm is identified. Venous  sinuses: As permitted by contrast timing, patent. Anatomic variants: None. Review of the MIP images confirms the above findings CT Brain Perfusion Findings: ASPECTS: 10 CBF (<30%) Volume: 21mL Perfusion (Tmax>6.0s) volume: 43mL Mismatch Volume: 60mL Infarction Location:None IMPRESSION: 1. No emergent large vessel occlusion. 2. 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 3. Patent right vertebral artery without significant stenosis. 4. 55% left common carotid artery stenosis. 5. Advanced intracranial atherosclerosis including moderate bilateral ICA stenoses and multiple severe anterior and posterior circulation branch vessel stenoses. 6. Negative CT perfusion imaging. These results were communicated to Dr. Erlinda Hong at 4:00 pmon 12/11/2018 by text page via the [facility] messaging system. Electronically Signed   By: Logan Bores M.D.   On: 12/11/2018 17:58   Dg Chest Port 1 View  Result Date: 12/29/2018 CLINICAL DATA:  Shortness of breath. Aspiration. EXAM: PORTABLE CHEST 1 VIEW COMPARISON:  Chest radiograph 12/12/2018 FINDINGS: Low lung volumes persist. Mild bibasilar atelectasis, similar to prior. No new airspace disease. Unchanged heart size and mediastinal contours. No pulmonary edema, large pleural effusion or pneumothorax. Unchanged osseous structures. IMPRESSION: Persistent low lung volumes with bibasilar atelectasis. No significant change from prior exam. Electronically Signed   By: Aurther Loft.D.  On: 12/29/2018 02:52   Dg Chest Port 1 View  Result Date: 12/12/2018 CLINICAL DATA:  Respiratory distress EXAM: PORTABLE CHEST 1 VIEW COMPARISON:  December 11, 2018 FINDINGS: Again identified are low lung volumes. There is probable atelectasis at the lung bases bilaterally. There are a few densities in the right mid lung zone favored to represent atelectasis. There may be small bilateral pleural effusions. There is no pneumothorax. The cardiac silhouette is stable from prior study. There  is no acute osseous abnormality. IMPRESSION: Persistent low lung volumes with bibasilar atelectasis. No new focal airspace opacity that would be concerning for aspiration. Electronically Signed   By: Constance Holster M.D.   On: 12/12/2018 13:57   Dg Chest Port 1 View  Result Date: 12/11/2018 CLINICAL DATA:  73 year old male with weakness. EXAM: PORTABLE CHEST 1 VIEW COMPARISON:  CTA head and neck, CT perfusion today. FINDINGS: Portable AP semi upright view at 1853 hours. Somewhat low lung volumes. Normal cardiac size and mediastinal contours. Visualized tracheal air column is within normal limits. Crowding of lung markings at the bases. Otherwise Allowing for portable technique the lungs are clear. No pneumothorax. Paucity of bowel gas. No acute osseous abnormality identified. IMPRESSION: Low lung volumes with mild atelectasis. Electronically Signed   By: Genevie Ann M.D.   On: 12/11/2018 19:21   Dg Abd Portable 1v  Result Date: 12/28/2018 CLINICAL DATA:  73 year old male with enteric tube placement. EXAM: PORTABLE ABDOMEN - 1 VIEW COMPARISON:  Radiograph dated 12/25/2018 FINDINGS: There has been interval removal of previously seen feeding tube and placement of an enteric tube. The tip of the tube is in the right upper abdomen, likely in the distal stomach. No bowel dilatation. Nonspecific bowel gas pattern. Degenerative changes of the spine. IMPRESSION: Enteric tube with tip in the distal stomach. Electronically Signed   By: Anner Crete M.D.   On: 12/28/2018 23:21   Dg Abd Portable 1v  Result Date: 12/25/2018 CLINICAL DATA:  Constipation EXAM: PORTABLE ABDOMEN - 1 VIEW COMPARISON:  11/04/2018 FINDINGS: None scattered large and small bowel gas is noted. Feeding catheter is noted within the distal stomach. No free air is seen. No definitive retained fecal material to suggest constipation is noted IMPRESSION: No acute abnormality noted. Electronically Signed   By: Inez Catalina M.D.   On: 12/25/2018  12:46   Ct Head Code Stroke Wo Contrast  Result Date: 12/11/2018 CLINICAL DATA:  Code stroke. Left facial droop and left arm weakness. EXAM: CT HEAD WITHOUT CONTRAST TECHNIQUE: Contiguous axial images were obtained from the base of the skull through the vertex without intravenous contrast. COMPARISON:  Brain MRI 11/07/2018 FINDINGS: Brain: There is no evidence of acute infarct, intracranial hemorrhage, mass, midline shift, or extra-axial fluid collection. There is mild-to-moderate cerebral atrophy. Patchy to confluent cerebral white matter hypodensities are nonspecific but compatible with moderate chronic small vessel ischemic disease. Vascular: Calcified atherosclerosis at the skull base. No hyperdense vessel. Skull: No fracture or focal osseous lesion. Sinuses/Orbits: Mild mucosal thickening in the paranasal sinuses. Clear mastoid air cells. Unremarkable orbits. Other: None. ASPECTS [facility] Stroke Program Early CT Score) - Ganglionic level infarction (caudate, lentiform nuclei, internal capsule, insula, M1-M3 cortex): 7 - Supraganglionic infarction (M4-M6 cortex): 3 Total score (0-10 with 10 being normal): 10 IMPRESSION: 1. No evidence of acute intracranial abnormality. 2. ASPECTS is 10. 3. 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 These results were called by telephone at the time of interpretation on 12/11/2018 at 3:45 p.m. to Dr.  Erlinda Hong, who verbally acknowledged these results. Electronically Signed   By: Logan Bores M.D.   On: 12/11/2018 17:27   [place] Ekg Site Rite  Result Date: 12/16/2018 If Site Rite image not attached, placement could not be confirmed due to current cardiac rhythm.   Labs:  CBC: Recent Labs    12/27/18 0423 12/29/18 0320 12/31/18 0432 01/03/19 0346  WBC 13.6* 13.5* 10.8* 14.0*  HGB 8.2* 7.6* 8.2* 7.9*  HCT 27.9* 26.0* 28.3* 27.7*  PLT 388 429* 426* 349    COAGS: Recent Labs    12/11/18 1600  INR 1.4*  APTT 30    BMP: Recent Labs     12/27/18 0423 12/29/18 0320 12/30/18 1102 01/01/19 0416 01/03/19 0346  NA 135 137 135  --  141  K 4.0 3.8 4.9  --  3.6  CL 103 106 108  --  108  CO2 22 23 18*  --  23  GLUCOSE 200* 124* 127*  --  167*  BUN 13 13 11   --  14  CALCIUM 9.1 9.1 8.7*  --  8.9  CREATININE 0.77 0.71 0.69 0.93 0.58*  GFRNONAA >60 >60 >60 >60 >60  GFRAA >60 >60 >60 >60 >60    LIVER FUNCTION TESTS: Recent Labs    12/12/18 0500 12/14/18 0416 12/17/18 0459 12/19/18 0328  BILITOT 0.9 0.5 0.9 0.5  AST 31 21 41 39  ALT 22 18 24 31   ALKPHOS 62 59 63 57  PROT 8.3* 7.9 6.7 6.3*  ALBUMIN 2.4* 2.3* 1.9* 1.8*    TUMOR MARKERS: No results for input(s): AFPTM, CEA, CA199, CHROMGRNA in the last 8760 hours.  Assessment and Plan:  Failure to thrive Deconditioning - long hospital stsy after code stroke Severe protein calorie malnutrition Scheduled for percutaneous gastric tube placement -- planned tentatively for Mon 7/27 Will keep check on Temp and wbc Will continue to try to get consent from family  Thank you for this interesting consult.  I greatly enjoyed meeting Julian Tyler and look forward to participating in their care.  A copy of this report was sent to the requesting provider on this date.  Electronically Signed: Lavonia Drafts, PA-C 01/03/2019, 11:44 AM   I spent a total of 40 Minutes    in face to face in clinical consultation, greater than 50% of which was counseling/coordinating care for percutaneous gastric tube placement

## 2019-01-03 NOTE — Progress Notes (Signed)
Occupational Therapy Treatment Patient Details Name: Julian Tyler MRN: [id number] DOB: 10-27-1945 Today's Date: 01/03/2019    History of present illness Pt is a 73 y/o nursing home resident who presents with AMS. MRI negative, and acute metabolic encephalopathy suspected to be 2 dehydration and hypernatremia. Possible UTI as well. PMH: stroke, CKD, Dry eyes, DM, hyperlipidemia   OT comments  Pt totalA for bed mobility and unable to assist with LUE due to weakness and swelling. RUE WFLs. Mitts doffed for OT session. Pt in chair position in bed performing grooming- washing face and brushing teeth (careful not to swallow for NPO) with set-upA. Pt unable to initiate rolling, but did attempt to scoot to [facility] with reverse trendelenberg position. Pt continues to progress following multi step commannds with increased time. OT following acutely.    Follow Up Recommendations  SNF    Equipment Recommendations  None recommended by OT    Recommendations for Other Services      Precautions / Restrictions Precautions Precautions: Fall;Other (comment) Precaution Comments: mitts on both hands Restrictions Weight Bearing Restrictions: No       Mobility Bed Mobility Overal bed mobility: Needs Assistance             General bed mobility comments: Attemtping to sit EOB or roll, but pt +2 so OT put pt in chair position for session.   Transfers                 General transfer comment: Pt is appropriate for transition OOB to chair with maxi-move with nursing staff. Recommend Geomat for chair if getting OOB.     Balance       Sitting balance - Comments: Unable to assess                                   ADL either performed or assessed with clinical judgement   ADL Overall ADL's : Needs assistance/impaired             Lower Body Bathing: Total assistance;Bed level;+2 for physical assistance Lower Body Bathing Details (indicate cue type and reason): Pt with  catheter and incontinent of BM                       General ADL Comments: Pt in chair position in bed performing grooming- washing face and brushing teeth (careful not to swallow for NPO) with set-upA     Vision   Vision Assessment?: Vision impaired- to be further tested in functional context   Perception     Praxis      Cognition Arousal/Alertness: Lethargic Behavior During Therapy: Flat affect Overall Cognitive Status: Difficult to assess Area of Impairment: Attention;Memory;Following commands;Awareness;Safety/judgement;Problem solving                 Orientation Level: Disoriented to;Time;Situation Current Attention Level: Focused Memory: Decreased recall of precautions;Decreased short-term memory Following Commands: Follows one step commands inconsistently;Follows one step commands with increased time Safety/Judgement: Decreased awareness of safety;Decreased awareness of deficits Awareness: Intellectual            Exercises     Shoulder Instructions       General Comments      Pertinent Vitals/ Pain       Pain Assessment: Faces Pain Score: 0-No pain Faces Pain Scale: Hurts little more Pain Location: B/L knees with any movement  Home Living  Prior Functioning/Environment              Frequency  Min 2X/week        Progress Toward Goals  OT Goals(current goals can now be found in the care plan section)  Progress towards OT goals: Progressing toward goals  Acute Rehab OT Goals Patient Stated Goal: unable to state goals  OT Goal Formulation: Patient unable to participate in goal setting Time For Goal Achievement: 01/17/19 ADL Goals Pt Will Perform Grooming: with modified independence;bed level Pt Will Transfer to Toilet: with mod assist;with +2 assist;squat pivot transfer;bedside commode Additional ADL Goal #1: Pt will attend to task in 3/3 trials with mutlimodal cues to  continue to attend to task, Additional ADL Goal #2: Pt modA for ADL at bed level or sitting EOB.  Plan Discharge plan remains appropriate    Co-evaluation                 AM-PAC OT "6 Clicks" Daily Activity     Outcome Measure   Help from another person eating meals?: Total Help from another person taking care of personal grooming?: A Lot Help from another person toileting, which includes using toliet, bedpan, or urinal?: Total Help from another person bathing (including washing, rinsing, drying)?: Total Help from another person to put on and taking off regular upper body clothing?: Total Help from another person to put on and taking off regular lower body clothing?: Total 6 Click Score: 7    End of Session    OT Visit Diagnosis: Muscle weakness (generalized) (M62.81);Cognitive communication deficit (R41.841) Symptoms and signs involving cognitive functions: Other cerebrovascular disease   Activity Tolerance Patient limited by fatigue;Patient limited by lethargy   Patient Left in bed;with call bell/phone within reach;with bed alarm set;with SCD's reapplied   Nurse Communication Mobility status        Time: 9326-7124 OT Time Calculation (min): 23 min  Charges: OT General Charges $OT Visit: 1 Visit OT Treatments $Self Care/Home Management : 8-22 mins $Therapeutic Activity: 8-22 mins   [name]) Marsa Aris OTR/L Acute Rehabilitation Services Pager: [phone] Office: [phone]    Audie Pinto 01/03/2019, 2:57 PM

## 2019-01-03 NOTE — Progress Notes (Signed)
PROGRESS NOTE    Julian Tyler  PPJ:[id number] DOB: Jul 26, 1945 DOA: 12/11/2018 PCP: Milana Na., MD   Brief Narrative:  Patient is a 73 year old man complex past medical history including stroke with residual right-sided deficits, diabetes mellitus, dementia, recent complex hospitalization where he was treated for seizures and delirium in the setting of dementia who presented to the emergency department 6/30 as a code stroke, reported left facial droop and left arm weakness.  Stat EEG did not show seizure.  MRI negative.  Admitted for acute metabolic encephalopathy, hypernatremia, dehydration, acute kidney injury, possible UTI.  Seen by neurology but no specific neurologic diagnoses made.  Symptoms attributed to hypernatremia.  However the patient has not significantly improved despite correction of hypernatremia.  Hospitalization was complicated by prolonged encephalopathy which waxes and wanes.  Patient unable to swallow and has remained n.p.o.  In discussion with family, feeding tube was temporarily placed and tube feeds initiated.  Patient continues to have waxing and waning of his mental status and is at times awake and alert and follows commands at other times hardly participates with examination.  7/23: Son has agreed for PEG tube placement.  IR consulted.  After PEG tube placement, he will be discharged to skilled nursing facility.  Social worker following.  Remains full code  Assessment & Plan:   Principal Problem:   Acute metabolic encephalopathy Active Problems:   Goals of care, counseling/discussion   Severe protein-calorie malnutrition (HCC)   Anemia   Palliative care by specialist   Vascular dementia without behavioral disturbance ([place])   Seizure ([place])   Dysphagia   Adult failure to thrive   Type 2 diabetes mellitus without complication (HCC)   Pressure injury of skin   Aspiration into airway   Acute metabolic encephalopathy: Patient has history of baseline  dementia.  Acute encephalopathy thought to be secondary to dehydration/hypernatremia. Sodium was 158 on admission.  Patient was also on multiple psych medications prior to admission.  ABG, ammonia were unremarkable.  EEG unremarkable.  MRI of brain on admission was negative.  Mental status continues to wax and wane. Etiology remains obscure.  Acute worsening of dementia is possibility. He tries to follows commands.  But oriented only to self.  Continues to remain n.p.o.  Seizure disorder: No seizure reported.  EEG did not show any seizure.  Continue Keppra, Vimpat on reduced doses.  If seizure recurs, will increase Keppra back to 1 g twice a day and Vimpat 100 mg twice a day.  Diabetes type 2: Continue sliding-scale insulin .  Continue monitoring CBC  Hypertension: Currently blood pressure stable.  Continue current medicines  History of vascular dementia with behavioral disturbance: Avoid sedatives.  Continue to monitor mental status  Severe protein calorie malnutrition/failure to thrive: Failed his swallowing evaluation. Undergoing PEG tube placement.  Borderline microcytic anemia: Currently stable.  Continue to follow  Abnormal echocardiogram with possible mobile density on aortic valve: Blood cultures did not show any growth.  TEE deferred due to patient's poor clinical status.  Patient's family also do not want to pursue any further testing.  Positive RPR/Treponema pallidum antibodies: Discussed with ID.  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  ID thought that this most likely false positive test. no indication for current treatment.  Abnormal serum/protein electrophoresis: No M spike. No further evaluation.  Discussed with oncology.  Acute kidney injury/hypernatremia: Resolved  History of CVA: Has weakness on the right side, slurred speech at baseline  Goals of care: Multiple discussion held with son.Remains  full code. Family agreed for PEG tube.  Disposition: SNF after PEG tube  placement.    Nutrition Problem: Inadequate oral intake Etiology: inability to eat      DVT prophylaxis: Lovenox Code Status: Full Family Communication: None Disposition Plan: Skilled Nursing facility after PEG tube placement.  Consultants: Neurology, palliative medicine  Procedures: Echocardiogram, EEG  Antimicrobials:  Anti-infectives (From admission, onward)   Start     Dose/Rate Route Frequency Ordered Stop   12/12/18 2100  cefTRIAXone (ROCEPHIN) 1 g in sodium chloride 0.9 % 100 mL IVPB  Status:  Discontinued     1 g 200 mL/hr over 30 Minutes Intravenous Every 24 hours 12/12/18 2052 12/15/18 1932      Subjective:  Patient seen and examined bedside this morning.  No change from yesterday.  Hemodynamically stable.    Objective: Vitals:   01/03/19 0016 01/03/19 0421 01/03/19 0730 01/03/19 1031  BP: (!) 142/78 128/68 (!) 149/78 (!) 154/63  Pulse: 88 85 96 98  Resp: 20 20 20    Temp: 99.8 F (37.7 C) 98.8 F (37.1 C) 99.3 F (37.4 C)   TempSrc: Oral Oral Oral   SpO2: 100% 99% 100%   Weight:        Intake/Output Summary (Last 24 hours) at 01/03/2019 1146 Last data filed at 01/02/2019 2330 Gross per 24 hour  Intake 10 ml  Output --  Net 10 ml   Filed Weights   12/29/18 0405 01/01/19 0433 01/02/19 0432  Weight: 108 kg 98.4 kg 118.8 kg    Examination:   General exam: Deconditioning/debility, Generalized weakness, morbidly obese  HEENT:PERRL,Oral mucosa moist, Ear/Nose normal on gross exam Respiratory system: No wheezes or crackles heard on anterior examination of the chest Cardiovascular system: S1 & S2 heard, RRR. No JVD, murmurs, rubs, gallops or clicks. Gastrointestinal system: Abdomen is distended, soft and nontender. No organomegaly or masses felt. Normal bowel sounds heard. Central nervous system: Becomes awake and alert on calling his name.  Knows that he is in the hospital.  Not oriented to time or person  extremities: No edema, no clubbing ,no  cyanosis Skin:  Some skin breakdowns on bilateral foot    Data Reviewed: I have personally reviewed following labs and imaging studies  CBC: Recent Labs  Lab 12/29/18 0320 12/31/18 0432 01/03/19 0346  WBC 13.5* 10.8* 14.0*  NEUTROABS 10.6* 8.4* 10.6*  HGB 7.6* 8.2* 7.9*  HCT 26.0* 28.3* 27.7*  MCV 79.5* 81.1 81.5  PLT 429* 426* 951   Basic Metabolic Panel: Recent Labs  Lab 12/29/18 0320 12/30/18 1102 01/01/19 0416 01/03/19 0346  NA 137 135  --  141  K 3.8 4.9  --  3.6  CL 106 108  --  108  CO2 23 18*  --  23  GLUCOSE 124* 127*  --  167*  BUN 13 11  --  14  CREATININE 0.71 0.69 0.93 0.58*  CALCIUM 9.1 8.7*  --  8.9   GFR: Estimated Creatinine Clearance: 104.6 mL/min (A) (by C-G formula based on SCr of 0.58 mg/dL (L)). Liver Function Tests: No results for input(s): AST, ALT, ALKPHOS, BILITOT, PROT, ALBUMIN in the last 168 hours. No results for input(s): LIPASE, AMYLASE in the last 168 hours. No results for input(s): AMMONIA in the last 168 hours. Coagulation Profile: No results for input(s): INR, PROTIME in the last 168 hours. Cardiac Enzymes: No results for input(s): CKTOTAL, CKMB, CKMBINDEX, TROPONINI in the last 168 hours. BNP (last 3 results) No results for input(s): PROBNP  in the last 8760 hours. HbA1C: No results for input(s): HGBA1C in the last 72 hours. CBG: Recent Labs  Lab 01/02/19 1644 01/02/19 2009 01/03/19 0015 01/03/19 0419 01/03/19 0729  GLUCAP 150* 172* 197* 170* 171*   Lipid Profile: No results for input(s): CHOL, HDL, LDLCALC, TRIG, CHOLHDL, LDLDIRECT in the last 72 hours. Thyroid Function Tests: No results for input(s): TSH, T4TOTAL, FREET4, T3FREE, THYROIDAB in the last 72 hours. Anemia Panel: No results for input(s): VITAMINB12, FOLATE, FERRITIN, TIBC, IRON, RETICCTPCT in the last 72 hours. Sepsis Labs: No results for input(s): PROCALCITON, LATICACIDVEN in the last 168 hours.  No results found for this or any previous visit (from  the past 240 hour(s)).       Radiology Studies: Ct Abdomen Wo Contrast  Result Date: 01/03/2019 CLINICAL DATA:  Evaluate anatomy prior to potential percutaneous gastrostomy tube placement EXAM: CT ABDOMEN WITHOUT CONTRAST TECHNIQUE: Multidetector CT imaging of the abdomen was performed following the standard protocol without IV contrast. COMPARISON:  CT abdomen pelvis-02/12/2016 FINDINGS: Evaluation of solid abdominal organs is degraded secondary to lack of intravenous contrast. Lower chest: Limited visualization of the lower thorax is negative for focal airspace opacity or pleural effusion. Normal heart size. Coronary artery calcifications. Trace amount of pericardial fluid, presumably physiologic. Hepatobiliary: Normal hepatic contour. Normal noncontrast appearance of the gallbladder given degree distention. No radiopaque gallstones. No ascites. Pancreas: Normal noncontrast appearance of the pancreas. Spleen: Normal noncontrast appearance of the spleen. Adrenals/Urinary Tract: Moderate amount of grossly symmetric likely age and body habitus related perinephric stranding. No evidence of urinary obstruction. No renal stones. There is mild thickening the bilateral adrenal glands without discrete nodule. The urinary bladder was not imaged. Stomach/Bowel: Enteric tube tip terminates within the duodenal bulb. There is no interposed hepatic parenchyma or transverse colon between the anterior wall of the distal aspect of the mid body of the stomach and ventral wall of the upper abdomen (image 26, series 3) and this percutaneous window would likely be improved with gaseous distention of the stomach. Tiny hiatal hernia. Moderate colonic stool burden without evidence of enteric obstruction. Normal noncontrast appearance of the imaged portions of the appendix. No pneumoperitoneum, pneumatosis or portal venous gas. Vascular/Lymphatic: Atherosclerotic plaque within the abdominal aorta. No bulky retroperitoneal or  mesenteric lymphadenopathy. Other: Diffuse body wall anasarca. Musculoskeletal: No acute or aggressive osseous abnormalities. Stigmata of DISH within the thoracic spine. IMPRESSION: 1. Gastric anatomy amenable to potential percutaneous gastrostomy tube placement as indicated. 2. Tiny hiatal hernia. 3. Coronary calcifications.  Aortic Atherosclerosis (ICD10-I70.0). Electronically Signed   By: Sandi Mariscal M.D.   On: 01/03/2019 07:41        Scheduled Meds:  amLODipine  10 mg Per Tube Daily   aspirin  325 mg Per Tube Daily   Or   aspirin  300 mg Rectal Daily   chlorhexidine  15 mL Mouth Rinse BID   dorzolamide  1 drop Both Eyes BID   enoxaparin (LOVENOX) injection  40 mg Subcutaneous Q24H   feeding supplement (PRO-STAT SUGAR FREE 64)  30 mL Per Tube Daily   hydrALAZINE  100 mg Per Tube BID   insulin aspart  0-9 Units Subcutaneous Q4H   lisinopril  20 mg Per Tube Daily   mouth rinse  15 mL Mouth Rinse q12n4p   metoprolol tartrate  25 mg Per Tube BID   nicotine  21 mg Transdermal Daily   polyethylene glycol  17 g Per Tube BID   senna  1 tablet Per Tube  QHS   sodium chloride flush  10-40 mL Intracatheter Q12H   Continuous Infusions:  feeding supplement (JEVITY 1.5 CAL/FIBER) 1,000 mL (01/02/19 1044)   lacosamide (VIMPAT) IV 50 mg (01/03/19 4859)   levETIRAcetam 500 mg (01/03/19 0810)     LOS: 22 days    Time spent: 35 mins.More than 50% of that time was spent in counseling and/or coordination of care.      Shelly Coss, MD [facility] Pager [phone]  If 7PM-7AM, please contact night-coverage [email or web address] Password [facility] 01/03/2019, 11:46 AM

## 2019-01-04 LAB — GLUCOSE, CAPILLARY
Glucose-Capillary: 183 mg/dL — ABNORMAL HIGH (ref 70–99)
Glucose-Capillary: 179 mg/dL — ABNORMAL HIGH (ref 70–99)
Glucose-Capillary: 193 mg/dL — ABNORMAL HIGH (ref 70–99)
Glucose-Capillary: 182 mg/dL — ABNORMAL HIGH (ref 70–99)
Glucose-Capillary: 196 mg/dL — ABNORMAL HIGH (ref 70–99)

## 2019-01-04 LAB — CBC
HCT: 26.6 % — ABNORMAL LOW (ref 39.0–52.0)
Hemoglobin: 7.8 g/dL — ABNORMAL LOW (ref 13.0–17.0)
MCH: 23.3 pg — ABNORMAL LOW (ref 26.0–34.0)
MCHC: 29.3 g/dL — ABNORMAL LOW (ref 30.0–36.0)
MCV: 79.4 fL — ABNORMAL LOW (ref 80.0–100.0)
Platelets: 309 10*3/uL (ref 150–400)
RBC: 3.35 MIL/uL — ABNORMAL LOW (ref 4.22–5.81)
RDW: 17.2 % — ABNORMAL HIGH (ref 11.5–15.5)
WBC: 12 10*3/uL — ABNORMAL HIGH (ref 4.0–10.5)
nRBC: 0 % (ref 0.0–0.2)

## 2019-01-04 MED ORDER — FERROUS SULFATE 300 (60 FE) MG/5ML PO SYRP
300.0000 mg | ORAL_SOLUTION | Freq: Two times a day (BID) | ORAL | Status: DC
  Administered 2019-01-04 – 2019-01-24 (×34): 300 mg
  Filled 2019-01-04 (×42): qty 5

## 2019-01-04 MED ORDER — LACOSAMIDE 50 MG PO TABS
50.0000 mg | ORAL_TABLET | Freq: Two times a day (BID) | ORAL | Status: DC
  Administered 2019-01-04 – 2019-01-06 (×6): 50 mg via NASOGASTRIC
  Filled 2019-01-04 (×6): qty 1

## 2019-01-04 MED ORDER — LEVETIRACETAM 100 MG/ML PO SOLN
500.0000 mg | Freq: Two times a day (BID) | ORAL | Status: DC
  Administered 2019-01-04 – 2019-01-06 (×6): 500 mg
  Filled 2019-01-04 (×13): qty 5

## 2019-01-04 MED ORDER — CEFAZOLIN SODIUM-DEXTROSE 2-4 GM/100ML-% IV SOLN
2.0000 g | INTRAVENOUS | Status: AC
  Administered 2019-01-06: 2 g via INTRAVENOUS
  Filled 2019-01-04: qty 100

## 2019-01-04 NOTE — Progress Notes (Signed)
PROGRESS NOTE    Julian Tyler  KYH:[id number] DOB: 1945-10-15 DOA: 12/11/2018 PCP: Milana Na., MD   Brief Narrative:  Patient is a 73 year old man complex past medical history including stroke with residual right-sided deficits, diabetes mellitus, dementia, recent complex hospitalization where he was treated for seizures and delirium in the setting of dementia who presented to the emergency department 6/30 as a code stroke, reported left facial droop and left arm weakness.  Stat EEG did not show seizure.  MRI negative.  Admitted for acute metabolic encephalopathy, hypernatremia, dehydration, acute kidney injury, possible UTI.  Seen by neurology but no specific neurologic diagnoses made.  Symptoms attributed to hypernatremia.  However the patient didnot improve despite correction of hypernatremia.  Hospitalization was complicated by prolonged encephalopathy which waxes and wanes.  Patient unable to swallow and has remained n.p.o.  After discussion with family, feeding tube was temporarily placed and tube feeds initiated.  Patient continued to have waxing and waning of his mental status and is at times awake and alert and follows commands at other times hardly participates with examination. Finally after prolonged hospitalization and rigorous discussion with the son by me and palliative care ,son has agreed for PEG tube placement.  IR consulted.  After PEG tube placement, he will be discharged to skilled nursing facility.  Social worker following.  Remains full code. Patient's son, [name] ,can be reached at [phone] only after 4 pm.  Assessment & Plan:   Principal Problem:   Acute metabolic encephalopathy Active Problems:   Goals of care, counseling/discussion   Severe protein-calorie malnutrition ([place])   Anemia   Palliative care by specialist   Vascular dementia without behavioral disturbance ([place])   Seizure ([place])   Dysphagia   Adult failure to thrive   Type 2 diabetes mellitus  without complication (HCC)   Pressure injury of skin   Aspiration into airway   Acute metabolic encephalopathy: Patient has history of baseline dementia.  Acute encephalopathy thought to be secondary to dehydration/hypernatremia. Sodium was 158 on admission.  Patient was also on multiple psych medications prior to admission.  ABG, ammonia were unremarkable.  EEG unremarkable.  MRI of brain on admission was negative.  Mental status continues to wax and wane. Etiology remains obscure.  Acute worsening of dementia is possibility. He follows commands,communicates,remains sleepy most of the time but oriented only to self.   Seizure disorder: No seizure reported.  EEG did not show any seizure.  Continue Keppra, Vimpat on current doses.   Diabetes type 2: Continue sliding-scale insulin .  Continue monitoring CBC  Hypertension: BP fluctuates.  Continue current medicines.Monitor BP  History of vascular dementia with behavioral disturbance: Avoid sedatives.  Continue to monitor mental status  Severe protein calorie malnutrition/failure to thrive: Failed his swallowing evaluation multiple times. Undergoing PEG tube placement.  Borderline microcytic anemia: Likely associated with malnutrition,poor oral intake.No evidence of blood loss.Hb has remained stable since last few days. Low iron of 14. Continue iron supplementation.  Abnormal echocardiogram with possible mobile density on aortic valve: Blood cultures did not show any growth.  TEE deferred due to patient's poor clinical status.  Patient's family also do not want to pursue any further testing.  Positive RPR/Treponema pallidum antibodies: Discussed with ID.  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  ID thought that this most likely false positive test. no indication for current treatment.  Abnormal serum/protein electrophoresis: No M spike. No further evaluation.  Discussed with oncology.  Acute kidney injury/hypernatremia: Resolved  History of  CVA:  Has weakness on the right side, slurred speech at baseline  Goals of care: Multiple discussion held with son.Remains full code. Family agreed for PEG tube.  Disposition: SNF after PEG tube placement.    Nutrition Problem: Inadequate oral intake Etiology: inability to eat      DVT prophylaxis: Lovenox Code Status: Full Family Communication: Son calls daily for update Disposition Plan: Skilled Nursing facility after PEG tube placement.  Consultants: Neurology, palliative medicine  Procedures: Echocardiogram, EEG  Antimicrobials:  Anti-infectives (From admission, onward)   Start     Dose/Rate Route Frequency Ordered Stop   12/12/18 2100  cefTRIAXone (ROCEPHIN) 1 g in sodium chloride 0.9 % 100 mL IVPB  Status:  Discontinued     1 g 200 mL/hr over 30 Minutes Intravenous Every 24 hours 12/12/18 2052 12/15/18 1932      Subjective:  Patient seen and examined the bedside this morning.  Hemodynamically stable.  His status has not changed from yesterday.  Continues to remain weak, lethargic, lying on the bed.  Opens eyes on calling his name and tries to speak.  Not in any kind of distress.  Disoriented.  Objective: Vitals:   01/03/19 1935 01/03/19 2307 01/04/19 0333 01/04/19 0700  BP: (!) 174/73 123/64 (!) 183/65 (!) 157/72  Pulse: 100 85 96   Resp: 18 18 18 18   Temp: 100.2 F (37.9 C) 99.4 F (37.4 C) 99.1 F (37.3 C) 99.6 F (37.6 C)  TempSrc: Oral Oral Oral Axillary  SpO2: 100% 99% 100% 100%  Weight:        Intake/Output Summary (Last 24 hours) at 01/04/2019 1019 Last data filed at 01/04/2019 3086 Gross per 24 hour  Intake 10 ml  Output 1250 ml  Net -1240 ml   Filed Weights   12/29/18 0405 01/01/19 0433 01/02/19 0432  Weight: 108 kg 98.4 kg 118.8 kg    Examination:  General exam: Not in distress, extremely deconditioned/debilitated, chronically ill looking, generalized weakness, obese  HEENT:PERRL,Oral mucosa moist, Ear/Nose normal on gross exam,feeding tube  Respiratory system: Bilateral equal air entry, normal vesicular breath sounds, no wheezes or crackles  Cardiovascular system: S1 & S2 heard, RRR. No JVD, murmurs, rubs, gallops or clicks. Gastrointestinal system: Abdomen is mildly distended, soft and nontender. No organomegaly or masses felt. Normal bowel sounds heard. Central nervous system:   Becomes awake and alert on calling his name.  Knows that he is in the hospital.  Not oriented to time or person Extremities: Trace edema, no clubbing ,no cyanosis Skin: Some skin breakdowns on bilateral foot   Data Reviewed: I have personally reviewed following labs and imaging studies  CBC: Recent Labs  Lab 12/29/18 0320 12/31/18 0432 01/03/19 0346 01/04/19 0753  WBC 13.5* 10.8* 14.0* 12.0*  NEUTROABS 10.6* 8.4* 10.6*  --   HGB 7.6* 8.2* 7.9* 7.8*  HCT 26.0* 28.3* 27.7* 26.6*  MCV 79.5* 81.1 81.5 79.4*  PLT 429* 426* 349 578   Basic Metabolic Panel: Recent Labs  Lab 12/29/18 0320 12/30/18 1102 01/01/19 0416 01/03/19 0346  NA 137 135  --  141  K 3.8 4.9  --  3.6  CL 106 108  --  108  CO2 23 18*  --  23  GLUCOSE 124* 127*  --  167*  BUN 13 11  --  14  CREATININE 0.71 0.69 0.93 0.58*  CALCIUM 9.1 8.7*  --  8.9   GFR: Estimated Creatinine Clearance: 104.6 mL/min (A) (by C-G formula based on SCr of 0.58  mg/dL (L)). Liver Function Tests: No results for input(s): AST, ALT, ALKPHOS, BILITOT, PROT, ALBUMIN in the last 168 hours. No results for input(s): LIPASE, AMYLASE in the last 168 hours. No results for input(s): AMMONIA in the last 168 hours. Coagulation Profile: No results for input(s): INR, PROTIME in the last 168 hours. Cardiac Enzymes: No results for input(s): CKTOTAL, CKMB, CKMBINDEX, TROPONINI in the last 168 hours. BNP (last 3 results) No results for input(s): PROBNP in the last 8760 hours. HbA1C: No results for input(s): HGBA1C in the last 72 hours. CBG: Recent Labs  Lab 01/03/19 1215 01/03/19 1637 01/03/19 1938  01/04/19 0549 01/04/19 0800  GLUCAP 170* 151* 158* 183* 179*   Lipid Profile: No results for input(s): CHOL, HDL, LDLCALC, TRIG, CHOLHDL, LDLDIRECT in the last 72 hours. Thyroid Function Tests: No results for input(s): TSH, T4TOTAL, FREET4, T3FREE, THYROIDAB in the last 72 hours. Anemia Panel: No results for input(s): VITAMINB12, FOLATE, FERRITIN, TIBC, IRON, RETICCTPCT in the last 72 hours. Sepsis Labs: No results for input(s): PROCALCITON, LATICACIDVEN in the last 168 hours.  No results found for this or any previous visit (from the past 240 hour(s)).       Radiology Studies: Ct Abdomen Wo Contrast  Result Date: 01/03/2019 CLINICAL DATA:  Evaluate anatomy prior to potential percutaneous gastrostomy tube placement EXAM: CT ABDOMEN WITHOUT CONTRAST TECHNIQUE: Multidetector CT imaging of the abdomen was performed following the standard protocol without IV contrast. COMPARISON:  CT abdomen pelvis-02/12/2016 FINDINGS: Evaluation of solid abdominal organs is degraded secondary to lack of intravenous contrast. Lower chest: Limited visualization of the lower thorax is negative for focal airspace opacity or pleural effusion. Normal heart size. Coronary artery calcifications. Trace amount of pericardial fluid, presumably physiologic. Hepatobiliary: Normal hepatic contour. Normal noncontrast appearance of the gallbladder given degree distention. No radiopaque gallstones. No ascites. Pancreas: Normal noncontrast appearance of the pancreas. Spleen: Normal noncontrast appearance of the spleen. Adrenals/Urinary Tract: Moderate amount of grossly symmetric likely age and body habitus related perinephric stranding. No evidence of urinary obstruction. No renal stones. There is mild thickening the bilateral adrenal glands without discrete nodule. The urinary bladder was not imaged. Stomach/Bowel: Enteric tube tip terminates within the duodenal bulb. There is no interposed hepatic parenchyma or transverse colon  between the anterior wall of the distal aspect of the mid body of the stomach and ventral wall of the upper abdomen (image 26, series 3) and this percutaneous window would likely be improved with gaseous distention of the stomach. Tiny hiatal hernia. Moderate colonic stool burden without evidence of enteric obstruction. Normal noncontrast appearance of the imaged portions of the appendix. No pneumoperitoneum, pneumatosis or portal venous gas. Vascular/Lymphatic: Atherosclerotic plaque within the abdominal aorta. No bulky retroperitoneal or mesenteric lymphadenopathy. Other: Diffuse body wall anasarca. Musculoskeletal: No acute or aggressive osseous abnormalities. Stigmata of DISH within the thoracic spine. IMPRESSION: 1. Gastric anatomy amenable to potential percutaneous gastrostomy tube placement as indicated. 2. Tiny hiatal hernia. 3. Coronary calcifications.  Aortic Atherosclerosis (ICD10-I70.0). Electronically Signed   By: Sandi Mariscal M.D.   On: 01/03/2019 07:41        Scheduled Meds: . amLODipine  10 mg Per Tube Daily  . aspirin  325 mg Per Tube Daily   Or  . aspirin  300 mg Rectal Daily  . chlorhexidine  15 mL Mouth Rinse BID  . dorzolamide  1 drop Both Eyes BID  . enoxaparin (LOVENOX) injection  40 mg Subcutaneous Q24H  . feeding supplement (PRO-STAT SUGAR FREE 64)  30 mL Per Tube Daily  . hydrALAZINE  100 mg Per Tube BID  . insulin aspart  0-9 Units Subcutaneous Q4H  . lacosamide  50 mg Per NG tube BID  . levETIRAcetam  500 mg Per Tube BID  . lisinopril  20 mg Per Tube Daily  . mouth rinse  15 mL Mouth Rinse q12n4p  . metoprolol tartrate  25 mg Per Tube BID  . nicotine  21 mg Transdermal Daily  . polyethylene glycol  17 g Per Tube BID  . senna  1 tablet Per Tube QHS  . sodium chloride flush  10-40 mL Intracatheter Q12H   Continuous Infusions: . feeding supplement (JEVITY 1.5 CAL/FIBER) 1,000 mL (01/02/19 1044)     LOS: 23 days    Time spent: 35 mins.More than 50% of that  time was spent in counseling and/or coordination of care.      Shelly Coss, MD [facility] Pager [phone]  If 7PM-7AM, please contact night-coverage [email or web address] Password [facility] 01/04/2019, 10:19 AM

## 2019-01-05 LAB — GLUCOSE, CAPILLARY
Glucose-Capillary: 158 mg/dL — ABNORMAL HIGH (ref 70–99)
Glucose-Capillary: 159 mg/dL — ABNORMAL HIGH (ref 70–99)
Glucose-Capillary: 168 mg/dL — ABNORMAL HIGH (ref 70–99)
Glucose-Capillary: 169 mg/dL — ABNORMAL HIGH (ref 70–99)
Glucose-Capillary: 174 mg/dL — ABNORMAL HIGH (ref 70–99)
Glucose-Capillary: 176 mg/dL — ABNORMAL HIGH (ref 70–99)
Glucose-Capillary: 194 mg/dL — ABNORMAL HIGH (ref 70–99)

## 2019-01-05 NOTE — NC FL2 (Signed)
Julian Tyler LEVEL OF CARE SCREENING TOOL     IDENTIFICATION  Patient Name: Julian Tyler Birthdate: Oct 28, 1945 Sex: male Admission Date (Current Location): 12/11/2018  [facility] and [place] Number:  Herbalist and Address:  [facility] [employer], [facility] [address] [place], [place] [address]      Provider Number: [id number]  Attending Physician Name and Address:  Tawni Millers,*  Relative Name and Phone Number:  Retia Passe, [place], Spouse, [phone]    Current Level of Care:   Recommended Level of Care: [place] Prior Approval Number:    Date Approved/Denied:   PASRR Number: [id number]  Discharge Plan:      Current Diagnoses: Patient Active Problem List   Diagnosis Date Noted  . Aspiration into airway   . Pressure injury of skin 12/29/2018  . Type 2 diabetes mellitus without complication ([place]) 96/29/5284  . Dysphagia   . Adult failure to thrive   . Acute metabolic encephalopathy 13/24/4010  . Seizure ([place]) 12/19/2018  . 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 Vascular dementia without behavioral disturbance ([place])   . Palliative care by specialist   . Severe protein-calorie malnutrition ([place]) 12/11/2018  . Anemia 12/11/2018  . Goals of care, counseling/discussion 12/01/2018  . Dysarthria 11/25/2018  . Expressive aphasia 11/25/2018  . Partial seizures ([place]) 11/25/2018  . Delirium 11/25/2018  . Dementia with behavioral problem ([place]) 11/25/2018  . Hypertension associated with diabetes ([place]) 11/25/2018  . Acute kidney injury ([place]) 11/25/2018  . Hyperlipidemia associated with type 2 diabetes mellitus ([place]) 11/25/2018  . Iron deficiency anemia 11/25/2018  . Tobacco abuse 11/25/2018    Orientation RESPIRATION BLADDER Height & Weight     Self  Normal Incontinent, External catheter Weight: 273 lb (123.8 kg) Height:     BEHAVIORAL SYMPTOMS/MOOD  NEUROLOGICAL BOWEL NUTRITION STATUS      Incontinent Feeding tube(Peg Tube placed on 01/07/2019, (Jevity 1.5 Cal))  AMBULATORY STATUS COMMUNICATION OF NEEDS Skin   Extensive Assist Verbally Skin abrasions(pressure injury on heel (dressing in place), cracking on heel)                       Personal Care Assistance Level of Assistance  Bathing, Feeding, Dressing, Total care Bathing Assistance: Maximum assistance Feeding assistance: Maximum assistance Dressing Assistance: Maximum assistance Total Care Assistance: Maximum assistance   Functional Limitations Info  Sight, Hearing, Speech Sight Info: Adequate Hearing Info: Adequate Speech Info: Adequate    SPECIAL CARE FACTORS FREQUENCY  PT (By licensed PT), OT (By licensed OT), Speech therapy     PT Frequency: 5x/wk OT Frequency: 5x/wk     Speech Therapy Frequency: 2x/wk      Contractures Contractures Info: Not present    Additional Factors Info  Code Status, Allergies, Insulin Sliding Scale Code Status Info: Full Code Allergies Info: Hctz (Hydrochlorothiazide), Sulfa Drugs Cross Reactors, Zithromax (Azithromycin), Robaxin (Methocarbamol)   Insulin Sliding Scale Info: insulin aspart novolog 0-9 units every 4 hours       Current Medications (01/05/2019):  This is the current hospital active medication list Current Facility-Administered Medications  Medication Dose Route Frequency Provider Last Rate Last Dose  . acetaminophen (TYLENOL) tablet 650 mg  650 mg Oral Q4H PRN Jani Gravel, MD   650 mg at 01/02/19 1651   Or  . acetaminophen (TYLENOL) solution 650 mg  650 mg Per Tube Q4H PRN Jani Gravel, MD   650 mg  at 01/03/19 1938   Or  . acetaminophen (TYLENOL) suppository 650 mg  650 mg Rectal Q4H PRN Jani Gravel, MD   650 mg at 12/19/18 1235  . amLODipine (NORVASC) tablet 10 mg  10 mg Per Tube Daily Samuella Cota, MD   10 mg at 01/05/19 0354  . aspirin tablet 325 mg  325 mg Per Tube Daily Samuella Cota, MD   325 mg at  01/05/19 6568   Or  . aspirin suppository 300 mg  300 mg Rectal Daily Samuella Cota, MD   300 mg at 12/30/18 1109  . [START ON 01/07/2019] ceFAZolin (ANCEF) IVPB 2g/100 mL premix  2 g Intravenous to [name], PA-C      . chlorhexidine (PERIDEX) 0.12 % solution 15 mL  15 mL Mouth Rinse BID Kathie Dike, MD   15 mL at 01/05/19 0929  . dorzolamide (TRUSOPT) 2 % ophthalmic solution 1 drop  1 drop Both Eyes BID Jani Gravel, MD   1 drop at 01/05/19 0930  . feeding supplement (JEVITY 1.5 CAL/FIBER) liquid 1,000 mL  1,000 mL Per Tube Continuous Samuella Cota, MD 50 mL/hr at 01/04/19 1226 1,000 mL at 01/04/19 1226  . feeding supplement (PRO-STAT SUGAR FREE 64) liquid 30 mL  30 mL Per Tube Daily Samuella Cota, MD   30 mL at 01/05/19 0927  . ferrous sulfate 300 (60 Fe) MG/5ML syrup 300 mg  300 mg Per Tube BID WC Adhikari, Amrit, MD   300 mg at 01/05/19 0926  . hydrALAZINE (APRESOLINE) tablet 100 mg  100 mg Per Tube BID Samuella Cota, MD   100 mg at 01/05/19 1275  . insulin aspart (novoLOG) injection 0-9 Units  0-9 Units Subcutaneous Q4H Jani Gravel, MD   2 Units at 01/05/19 1639  . lacosamide (VIMPAT) tablet 50 mg  50 mg Per NG tube BID Shelly Coss, MD   50 mg at 01/05/19 0927  . levETIRAcetam (KEPPRA) 100 MG/ML solution 500 mg  500 mg Per Tube BID Shelly Coss, MD   500 mg at 01/05/19 0926  . lisinopril (ZESTRIL) tablet 20 mg  20 mg Per Tube Daily Samuella Cota, MD   20 mg at 01/05/19 1700  . MEDLINE mouth rinse  15 mL Mouth Rinse q12n4p Kathie Dike, MD   15 mL at 01/05/19 1651  . metoprolol tartrate (LOPRESSOR) tablet 25 mg  25 mg Per Tube BID Samuella Cota, MD   25 mg at 01/05/19 1749  . nicotine (NICODERM CQ - dosed in mg/24 hours) patch 21 mg  21 mg Transdermal Daily Jani Gravel, MD   21 mg at 01/05/19 0945  . oxyCODONE-acetaminophen (PERCOCET/ROXICET) 5-325 MG per tablet 1 tablet  1 tablet Oral Q6H PRN Shelly Coss, MD   1 tablet at 01/04/19 0422    And  . oxyCODONE (Oxy IR/ROXICODONE) immediate release tablet 5 mg  5 mg Oral Q6H PRN Shelly Coss, MD   5 mg at 01/04/19 0421  . polyethylene glycol (MIRALAX / GLYCOLAX) packet 17 g  17 g Per Tube BID Samuella Cota, MD   17 g at 01/05/19 0926  . senna (SENOKOT) tablet 8.6 mg  1 tablet Per Tube QHS Samuella Cota, MD   8.6 mg at 01/04/19 2224  . sodium chloride flush (NS) 0.9 % injection 10-40 mL  10-40 mL Intracatheter Q12H Samuella Cota, MD   10 mL at 01/05/19 0951  . sodium chloride flush (NS) 0.9 % injection  10-40 mL  10-40 mL Intracatheter PRN Samuella Cota, MD   10 mL at 01/04/19 0847  . zolpidem (AMBIEN) tablet 5 mg  5 mg Oral QHS PRN Schorr, Rhetta Mura, NP   5 mg at 12/30/18 2423     Discharge Medications: Please see discharge summary for a list of discharge medications.  Relevant Imaging Results:  Relevant Lab Results:   Additional Information SSN: [id number]  Philippa [place] Saharah Sherrow, LCSWA

## 2019-01-05 NOTE — Progress Notes (Signed)
PROGRESS NOTE    Julian Tyler  JGG:[id number] DOB: 1946/04/28 DOA: 12/11/2018 PCP: Milana Na., MD    Brief Narrative:  73 year old male who presented with altered mental status.  He does have significant past medical history for hypertension, dyslipidemia, type 2 diabetes mellitus, history of stroke, seizures and dementia.  Patient was noted to have right-sided weakness, difficulty talking, and slurred speech.  He had agitation and received recently combination of Seroquel, alprazolam and Depakote.  On his initial physical examination his blood pressure was 170/91, pulse rate 131, respiratory 27, oxygen saturation 96%.  He was neurologically nonfocal, oriented only x1, his lungs were clear to auscultation bilaterally, heart S1-S2 present rhythmic, his abdomen soft, no lower extremity edema.  Sodium 158, potassium 4.9, chloride 129, bicarb 19, glucose 200, BUN 67, creatinine 1.5.  Urinalysis had more than 50 white cells, 6-10 red cells, specific gravity more than 1.046.   Patient was admitted to the hospital with a working diagnosis of metabolic encephalopathy complicated by hyponatremia, dehydration and acute kidney injury.   Patient developed persistent encephalopathy with features of delirium, severe calorie protein malnutrition with swallow dysfunction. Now scheduled for PEG tube on 07.27.20.  Assessment & Plan:   Principal Problem:   Acute metabolic encephalopathy Active Problems:   Goals of care, counseling/discussion   Severe protein-calorie malnutrition (HCC)   Anemia   Palliative care by specialist   Vascular dementia without behavioral disturbance ([place])   Seizure ([place])   Dysphagia   Adult failure to thrive   Type 2 diabetes mellitus without complication (HCC)   Pressure injury of skin   Aspiration into airway   1. Acute metabolic encephalopathy with delirium, in the setting of vascular dementia and behavioral disturbance. This am patient is calm and not  agitated. Pending peg tube for further nutritional supplementation. For now continue NG tube for feedings. Continue neuro checks and aspiration precautions.   2. Hx of CVA with swallow dysfunction.  Continue aspiration precautions, NG tube for feedings, pending peg tube placement.   3. Seizures. No signs of active seizures, continue antiepileptic regimen with keppra and lacosemide.   4. T2DM. Continue glucose cover and monitoring with insulin sliding scale.   5. Right heal deep tissue injury, not able to stage, not present on admission, continue local wound care.   6. Severe protein malnutrition. Continue nutritional supplements.   7.AKI. Clinically resolved.  8. Obesity. BMI 41.5.   9. HTN. Continue blood pressure control with lisinopril, metoprolol, hydralazine and amlodipine.   DVT prophylaxis: enoxaparin   Code Status: full Family Communication: no family at the bedside  Disposition Plan/ discharge barriers: pending placement.   Body mass index is 41.51 kg/m. Malnutrition Type:  Nutrition Problem: Inadequate oral intake Etiology: inability to eat   Malnutrition Characteristics:  Signs/Symptoms: NPO status   Nutrition Interventions:  Interventions: Refer to RD note for recommendations  RN Pressure Injury Documentation: Pressure Injury 12/26/18 Heel Right Deep Tissue Injury - Purple or maroon localized area of discolored intact skin or blood-filled blister due to damage of underlying soft tissue from pressure and/or shear. right heel deep tissue injury (Active)  12/26/18 1400  Location: Heel  Location Orientation: Right  Staging: Deep Tissue Injury - Purple or maroon localized area of discolored intact skin or blood-filled blister due to damage of underlying soft tissue from pressure and/or shear.  Wound Description (Comments): right heel deep tissue injury  Present on Admission:      Consultants:   Neurology   Palliative Care  Procedures:      Antimicrobials:       Subjective: Continue to be disorientated, but follows commands, tolerating well tube feedings. No chest pain, no dyspnea, no nausea or vomiting.   Objective: Vitals:   01/05/19 0010 01/05/19 0347 01/05/19 0504 01/05/19 0812  BP: 118/72 (!) 166/80  (!) 155/79  Pulse: 95 91  96  Resp: 17 18  18   Temp: 99.8 F (37.7 C) 98.3 F (36.8 C)  99.3 F (37.4 C)  TempSrc: Oral Oral  Oral  SpO2: 99% 99%  100%  Weight:   123.8 kg     Intake/Output Summary (Last 24 hours) at 01/05/2019 0913 Last data filed at 01/05/2019 0504 Gross per 24 hour  Intake -  Output 300 ml  Net -300 ml   Filed Weights   01/01/19 0433 01/02/19 0432 01/05/19 0504  Weight: 98.4 kg 118.8 kg 123.8 kg    Examination:   General: deconditioned  Neurology: Awake and alert, non focal. Disorientated, but not agitated.   E ENT: no pallor, no icterus, oral mucosa moist/ ng tube in place.  Cardiovascular: No JVD. S1-S2 present, rhythmic, no gallops, rubs, or murmurs. No lower extremity edema. Pulmonary: positive breath sounds bilaterally, adequate air movement, no wheezing, rhonchi or rales. Gastrointestinal. Abdomen with distention, tympanic with no organomegaly, non tender, no rebound or guarding Skin. No rashes Musculoskeletal: no joint deformities     Data Reviewed: I have personally reviewed following labs and imaging studies  CBC: Recent Labs  Lab 12/31/18 0432 01/03/19 0346 01/04/19 0753  WBC 10.8* 14.0* 12.0*  NEUTROABS 8.4* 10.6*  --   HGB 8.2* 7.9* 7.8*  HCT 28.3* 27.7* 26.6*  MCV 81.1 81.5 79.4*  PLT 426* 349 938   Basic Metabolic Panel: Recent Labs  Lab 12/30/18 1102 01/01/19 0416 01/03/19 0346  NA 135  --  141  K 4.9  --  3.6  CL 108  --  108  CO2 18*  --  23  GLUCOSE 127*  --  167*  BUN 11  --  14  CREATININE 0.69 0.93 0.58*  CALCIUM 8.7*  --  8.9   GFR: Estimated Creatinine Clearance: 107 mL/min (A) (by C-G formula based on SCr of 0.58 mg/dL (L)).  Liver Function Tests: No results for input(s): AST, ALT, ALKPHOS, BILITOT, PROT, ALBUMIN in the last 168 hours. No results for input(s): LIPASE, AMYLASE in the last 168 hours. No results for input(s): AMMONIA in the last 168 hours. Coagulation Profile: No results for input(s): INR, PROTIME in the last 168 hours. Cardiac Enzymes: No results for input(s): CKTOTAL, CKMB, CKMBINDEX, TROPONINI in the last 168 hours. BNP (last 3 results) No results for input(s): PROBNP in the last 8760 hours. HbA1C: No results for input(s): HGBA1C in the last 72 hours. CBG: Recent Labs  Lab 01/04/19 1709 01/04/19 2036 01/05/19 0132 01/05/19 0347 01/05/19 0809  GLUCAP 182* 196* 174* 159* 169*   Lipid Profile: No results for input(s): CHOL, HDL, LDLCALC, TRIG, CHOLHDL, LDLDIRECT in the last 72 hours. Thyroid Function Tests: No results for input(s): TSH, T4TOTAL, FREET4, T3FREE, THYROIDAB in the last 72 hours. Anemia Panel: No results for input(s): VITAMINB12, FOLATE, FERRITIN, TIBC, IRON, RETICCTPCT in the last 72 hours.    Radiology Studies: I have reviewed all of the imaging during this hospital visit personally     Scheduled Meds: . amLODipine  10 mg Per Tube Daily  . aspirin  325 mg Per Tube Daily   Or  . aspirin  300 mg Rectal Daily  . chlorhexidine  15 mL Mouth Rinse BID  . dorzolamide  1 drop Both Eyes BID  . enoxaparin (LOVENOX) injection  40 mg Subcutaneous Q24H  . feeding supplement (PRO-STAT SUGAR FREE 64)  30 mL Per Tube Daily  . ferrous sulfate  300 mg Per Tube BID WC  . hydrALAZINE  100 mg Per Tube BID  . insulin aspart  0-9 Units Subcutaneous Q4H  . lacosamide  50 mg Per NG tube BID  . levETIRAcetam  500 mg Per Tube BID  . lisinopril  20 mg Per Tube Daily  . mouth rinse  15 mL Mouth Rinse q12n4p  . metoprolol tartrate  25 mg Per Tube BID  . nicotine  21 mg Transdermal Daily  . polyethylene glycol  17 g Per Tube BID  . senna  1 tablet Per Tube QHS  . sodium chloride  flush  10-40 mL Intracatheter Q12H   Continuous Infusions: . [START ON 01/07/2019]  ceFAZolin (ANCEF) IV    . feeding supplement (JEVITY 1.5 CAL/FIBER) 1,000 mL (01/04/19 1226)     LOS: 24 days         Gerome Apley, MD

## 2019-01-06 DIAGNOSIS — L89150 Pressure ulcer of sacral region, unstageable: Secondary | ICD-10-CM

## 2019-01-06 DIAGNOSIS — R627 Adult failure to thrive: Secondary | ICD-10-CM

## 2019-01-06 LAB — GLUCOSE, CAPILLARY
Glucose-Capillary: 174 mg/dL — ABNORMAL HIGH (ref 70–99)
Glucose-Capillary: 151 mg/dL — ABNORMAL HIGH (ref 70–99)
Glucose-Capillary: 204 mg/dL — ABNORMAL HIGH (ref 70–99)
Glucose-Capillary: 157 mg/dL — ABNORMAL HIGH (ref 70–99)
Glucose-Capillary: 178 mg/dL — ABNORMAL HIGH (ref 70–99)
Glucose-Capillary: 176 mg/dL — ABNORMAL HIGH (ref 70–99)

## 2019-01-06 NOTE — Progress Notes (Signed)
PROGRESS NOTE    Julian Tyler  NLG:[id number] DOB: May 26, 1946 DOA: 12/11/2018 PCP: Milana Na., MD    Brief Narrative:  73 year old male who presented with altered mental status.  He does have significant past medical history for hypertension, dyslipidemia, type 2 diabetes mellitus, history of stroke, seizures and dementia.  Patient was noted to have right-sided weakness, difficulty talking, and slurred speech.  He had agitation and received recently combination of Seroquel, alprazolam and Depakote.  On his initial physical examination his blood pressure was 170/91, pulse rate 131, respiratory 27, oxygen saturation 96%.  He was neurologically nonfocal, oriented only x1, his lungs were clear to auscultation bilaterally, heart S1-S2 present rhythmic, his abdomen soft, no lower extremity edema.  Sodium 158, potassium 4.9, chloride 129, bicarb 19, glucose 200, BUN 67, creatinine 1.5.  Urinalysis had more than 50 white cells, 6-10 red cells, specific gravity more than 1.046.   Patient was admitted to the hospital with a working diagnosis of metabolic encephalopathy complicated by hyponatremia, dehydration and acute kidney injury.   Patient developed persistent encephalopathy with features of delirium, severe calorie protein malnutrition with swallow dysfunction. Now scheduled for PEG tube on 07.27.20.   Assessment & Plan:   Principal Problem:   Acute metabolic encephalopathy Active Problems:   Goals of care, counseling/discussion   Severe protein-calorie malnutrition (HCC)   Anemia   Palliative care by specialist   Vascular dementia without behavioral disturbance ([place])   Seizure ([place])   Dysphagia   Adult failure to thrive   Type 2 diabetes mellitus without complication (HCC)   Pressure injury of skin   Aspiration into airway     1. Acute metabolic encephalopathy with delirium, in the setting of vascular dementia and behavioral disturbance. continue to tolerate well tube  feedings per NG. Aspiration precautions, plan for peg tube in am.   2. Hx of CVA with swallow dysfunction. plan for peg tube in am.  3. Seizures. Continue with keppra and lacosemide.   4. T2DM. Capillary glucose 176, 168, 174, 151, 204, will continue glucose cover and monitoring with insulin sliding scale. Continue tube feedings.   5. Right heal deep tissue injury, not able to stage, Continue with local wound care.   6. Severe protein malnutrition. On nutritional supplements.   7.AKI. Clinically resolved.  8. Obesity. BMI 41.5. Will need follow up as out patient.   9. HTN. On lisinopril, metoprolol, hydralazine and amlodipine, for blood pressure control, systolic blood pressure today 130 mmHg.   DVT prophylaxis: enoxaparin   Code Status: full Family Communication: no family at the bedside  Disposition Plan/ discharge barriers: pending placement.    Body mass index is 42.42 kg/m. Malnutrition Type:  Nutrition Problem: Inadequate oral intake Etiology: inability to eat   Malnutrition Characteristics:  Signs/Symptoms: NPO status   Nutrition Interventions:  Interventions: Refer to RD note for recommendations  RN Pressure Injury Documentation: Pressure Injury 12/26/18 Heel Right Deep Tissue Injury - Purple or maroon localized area of discolored intact skin or blood-filled blister due to damage of underlying soft tissue from pressure and/or shear. right heel deep tissue injury (Active)  12/26/18 1400  Location: Heel  Location Orientation: Right  Staging: Deep Tissue Injury - Purple or maroon localized area of discolored intact skin or blood-filled blister due to damage of underlying soft tissue from pressure and/or shear.  Wound Description (Comments): right heel deep tissue injury  Present on Admission:      Consultants:   IR   Procedures:  Antimicrobials:       Subjective: Patient is tolerating well tube feedings, no chest pain or dyspnea,  follows commands, responding to simple questions with yes and no. For PEG tube in am.   Objective: Vitals:   01/05/19 2359 01/06/19 0331 01/06/19 0340 01/06/19 0749  BP: 135/72 140/82  (!) 151/62  Pulse: 88 95  85  Resp:  16  17  Temp: 99.2 F (37.3 C) 99.2 F (37.3 C)  99.1 F (37.3 C)  TempSrc: Oral Oral  Oral  SpO2: 100% 100%  99%  Weight:   126.6 kg     Intake/Output Summary (Last 24 hours) at 01/06/2019 0954 Last data filed at 01/06/2019 0527 Gross per 24 hour  Intake -  Output 600 ml  Net -600 ml   Filed Weights   01/02/19 0432 01/05/19 0504 01/06/19 0340  Weight: 118.8 kg 123.8 kg 126.6 kg    Examination:   General: deconditioned  Neurology: Awake and alert, non focal. Following simple commands and responding to simple questions with yes and no.   E ENT: no pallor, no icterus, oral mucosa moist. NG tube in place.  Cardiovascular: No JVD. S1-S2 present, rhythmic, no gallops, rubs, or murmurs. Trace non pitting lower extremity edema. Pulmonary: positive breath sounds bilaterally, adequate air movement, no wheezing, rhonchi or rales. Gastrointestinal. Abdomen with no organomegaly, non tender, no rebound or guarding Skin. No rashes Musculoskeletal: no joint deformities     Data Reviewed: I have personally reviewed following labs and imaging studies  CBC: Recent Labs  Lab 12/31/18 0432 01/03/19 0346 01/04/19 0753  WBC 10.8* 14.0* 12.0*  NEUTROABS 8.4* 10.6*  --   HGB 8.2* 7.9* 7.8*  HCT 28.3* 27.7* 26.6*  MCV 81.1 81.5 79.4*  PLT 426* 349 254   Basic Metabolic Panel: Recent Labs  Lab 12/30/18 1102 01/01/19 0416 01/03/19 0346  NA 135  --  141  K 4.9  --  3.6  CL 108  --  108  CO2 18*  --  23  GLUCOSE 127*  --  167*  BUN 11  --  14  CREATININE 0.69 0.93 0.58*  CALCIUM 8.7*  --  8.9   GFR: Estimated Creatinine Clearance: 108.3 mL/min (A) (by C-G formula based on SCr of 0.58 mg/dL (L)). Liver Function Tests: No results for input(s): AST, ALT,  ALKPHOS, BILITOT, PROT, ALBUMIN in the last 168 hours. No results for input(s): LIPASE, AMYLASE in the last 168 hours. No results for input(s): AMMONIA in the last 168 hours. Coagulation Profile: No results for input(s): INR, PROTIME in the last 168 hours. Cardiac Enzymes: No results for input(s): CKTOTAL, CKMB, CKMBINDEX, TROPONINI in the last 168 hours. BNP (last 3 results) No results for input(s): PROBNP in the last 8760 hours. HbA1C: No results for input(s): HGBA1C in the last 72 hours. CBG: Recent Labs  Lab 01/05/19 1603 01/05/19 1943 01/05/19 2344 01/06/19 0338 01/06/19 0725  GLUCAP 158* 176* 168* 174* 151*   Lipid Profile: No results for input(s): CHOL, HDL, LDLCALC, TRIG, CHOLHDL, LDLDIRECT in the last 72 hours. Thyroid Function Tests: No results for input(s): TSH, T4TOTAL, FREET4, T3FREE, THYROIDAB in the last 72 hours. Anemia Panel: No results for input(s): VITAMINB12, FOLATE, FERRITIN, TIBC, IRON, RETICCTPCT in the last 72 hours.    Radiology Studies: I have reviewed all of the imaging during this hospital visit personally     Scheduled Meds: . amLODipine  10 mg Per Tube Daily  . aspirin  325 mg Per Tube Daily  Or  . aspirin  300 mg Rectal Daily  . chlorhexidine  15 mL Mouth Rinse BID  . dorzolamide  1 drop Both Eyes BID  . feeding supplement (PRO-STAT SUGAR FREE 64)  30 mL Per Tube Daily  . ferrous sulfate  300 mg Per Tube BID WC  . hydrALAZINE  100 mg Per Tube BID  . insulin aspart  0-9 Units Subcutaneous Q4H  . lacosamide  50 mg Per NG tube BID  . levETIRAcetam  500 mg Per Tube BID  . lisinopril  20 mg Per Tube Daily  . mouth rinse  15 mL Mouth Rinse q12n4p  . metoprolol tartrate  25 mg Per Tube BID  . nicotine  21 mg Transdermal Daily  . polyethylene glycol  17 g Per Tube BID  . senna  1 tablet Per Tube QHS  . sodium chloride flush  10-40 mL Intracatheter Q12H   Continuous Infusions: . [START ON 01/07/2019]  ceFAZolin (ANCEF) IV    . feeding  supplement (JEVITY 1.5 CAL/FIBER) 1,000 mL (01/04/19 1226)     LOS: 25 days        Jahyra Sukup Gerome Apley, MD

## 2019-01-07 DIAGNOSIS — T17908A Unspecified foreign body in respiratory tract, part unspecified causing other injury, initial encounter: Secondary | ICD-10-CM

## 2019-01-07 LAB — GLUCOSE, CAPILLARY
Glucose-Capillary: 111 mg/dL — ABNORMAL HIGH (ref 70–99)
Glucose-Capillary: 147 mg/dL — ABNORMAL HIGH (ref 70–99)
Glucose-Capillary: 152 mg/dL — ABNORMAL HIGH (ref 70–99)
Glucose-Capillary: 150 mg/dL — ABNORMAL HIGH (ref 70–99)
Glucose-Capillary: 130 mg/dL — ABNORMAL HIGH (ref 70–99)

## 2019-01-07 LAB — BASIC METABOLIC PANEL
Anion gap: 8 (ref 5–15)
BUN: 11 mg/dL (ref 8–23)
CO2: 25 mmol/L (ref 22–32)
Calcium: 8.8 mg/dL — ABNORMAL LOW (ref 8.9–10.3)
Chloride: 105 mmol/L (ref 98–111)
Creatinine, Ser: 0.56 mg/dL — ABNORMAL LOW (ref 0.61–1.24)
GFR calc Af Amer: >60 mL/min (ref >60)
GFR calc non Af Amer: >60 mL/min (ref >60)
Glucose, Bld: 127 mg/dL — ABNORMAL HIGH (ref 70–99)
Potassium: 3.8 mmol/L (ref 3.5–5.1)
Sodium: 138 mmol/L (ref 135–145)

## 2019-01-07 LAB — CBC
HCT: 26.8 % — ABNORMAL LOW (ref 39.0–52.0)
Hemoglobin: 8 g/dL — ABNORMAL LOW (ref 13.0–17.0)
MCH: 23.3 pg — ABNORMAL LOW (ref 26.0–34.0)
MCHC: 29.9 g/dL — ABNORMAL LOW (ref 30.0–36.0)
MCV: 78.1 fL — ABNORMAL LOW (ref 80.0–100.0)
Platelets: 293 10*3/uL (ref 150–400)
RBC: 3.43 MIL/uL — ABNORMAL LOW (ref 4.22–5.81)
RDW: 16.7 % — ABNORMAL HIGH (ref 11.5–15.5)
WBC: 12.3 10*3/uL — ABNORMAL HIGH (ref 4.0–10.5)
nRBC: 0 % (ref 0.0–0.2)

## 2019-01-07 LAB — SARS CORONAVIRUS 2 BY RT PCR (HOSPITAL ORDER, PERFORMED IN ~~LOC~~ HOSPITAL LAB): SARS Coronavirus 2: NEGATIVE

## 2019-01-07 LAB — PROTIME-INR
INR: 1.2 (ref 0.8–1.2)
Prothrombin Time: 14.8 seconds (ref 11.4–15.2)

## 2019-01-07 NOTE — Progress Notes (Addendum)
Patient Pulled his NG tube complete out. IR called and said they will not be able to place Peg tube until tomorrow. Nurse notified MD Arrien Waiting for orders.

## 2019-01-07 NOTE — Progress Notes (Signed)
IR.  Patient was scheduled for an image-guided percutaneous gastrostomy tube placement tentative for today in IR. Due to scheduling issues, procedure has been rescheduled for 01/08/2019. Patient to be NPO at midnight. 3W RN aware.  Please call IR with questions/concerns.   Bea Graff Oretta Berkland, PA-C 01/07/2019, 3:55 PM

## 2019-01-07 NOTE — Progress Notes (Signed)
Physical Therapy Discharge Patient Details Name: Julian Tyler MRN: [id number] DOB: 05-16-1946 Today's Date: 01/07/2019 Time:  -     Patient discharged from PT services secondary to patient has made no progress toward goals in a reasonable time frame.  Please see latest therapy progress note for current level of functioning and progress toward goals.    Progress and discharge plan discussed with patient and/or caregiver: Patient unable to participate in discharge planning and no caregivers available  Physical therapy has been following this patient since 12/12/2018. Pt has not made any real functional gains since evaluation, and sessions are mostly limited to EOB activity with heavy +2 assist or in bed activity/position changes. Pt from SNF with anticipated return to SNF. At this time, feel pt is most appropriate for custodial care, and not skilled therapy interventions. Pt is at a high risk for skin breakdown due to immobility, and recommend frequent position changes to protect skin integrity. Pt is appropriate for OOB to chair with Maxi-Move lift and geo mat in chair for pressure relief. Will sign off at this time. If pt's condition improves and MD feels skilled acute PT services are appropriate, please reconsult.      Gerarda Gunther, PT, DPT Acute Rehabilitation Services Pager: [phone] Office: [phone]

## 2019-01-07 NOTE — Progress Notes (Signed)
Nutrition Follow-up  DOCUMENTATION CODES:   Obesity unspecified  INTERVENTION:  Continue Jevity 1.5formula at goal rate of50ml/hr.  Continue30ml Prostat (or equivalent) once daily.   Tube feeding regimen provides1900kcal (100% of needs),90grams of protein, and 912ml of H2O.  Recommend continuation of current tube feeding regimen once PEG placed and ready for use.   NUTRITION DIAGNOSIS:   Inadequate oral intake related to inability to eat as evidenced by NPO status; ongoing  GOAL:   Patient will meet greater than or equal to 90% of their needs; met with TF  MONITOR:   TF tolerance, Weight trends, Labs, I & O's, Skin  REASON FOR ASSESSMENT:   NPO/Clear Liquid Diet, Low [place]    ASSESSMENT:   73-year-old male with a history of previous stroke, hypertension, diabetes, seizure disorder, dementia who is a resident of a skilled nursing facility, was brought to the hospital with worsening mental status.  Found to be significantly dehydrated, hypernatremic, with acute kidney injury.  Pt continue on NPO status. Pt continues to be confused and disorientated per MD. Plans for PEG placement tomorrow. Pt has been tolerating current tube feeding regimen. Recommend continuation of tube feeding orders once PEG is placed and ready for use. RD to continue to monitor for tolerance.   Labs and medications reviewed.    Diet Order:   Diet Order            Diet NPO time specified Except for: Sips with Meds  Diet effective midnight        Diet NPO time specified  Diet effective midnight              EDUCATION NEEDS:   Not appropriate for education at this time  Skin:  Skin Assessment: Skin Integrity Issues: Skin Integrity Issues:: DTI DTI: R heel  Last BM:  7/26  Height:   Ht Readings from Last 1 Encounters:  12/11/18 5' 8" (1.727 m)    Weight:   Wt Readings from Last 1 Encounters:  01/07/19 123.8 kg    Ideal Body Weight:  70 kg  BMI:  Body mass index  is 41.51 kg/m.  Estimated Nutritional Needs:   Kcal:  1900-2100  Protein:  90-110 grams  Fluid:  1.9 - 2.1 L/day     , MS, RD, LDN Pager # [phone] After hours/ weekend pager # [phone]  

## 2019-01-07 NOTE — Care Management Important Message (Signed)
Important Message  Patient Details  Name: Julian Tyler MRN: [id number] Date of Birth: 05-Apr-1946   Medicare Important Message Given:  Yes     Orbie Pyo 01/07/2019, 2:40 PM

## 2019-01-07 NOTE — Progress Notes (Signed)
PROGRESS NOTE    Julian Tyler  QPR:[id number] DOB: January 15, 1946 DOA: 12/11/2018 PCP: Milana Na., MD    Brief Narrative:  73 year old male who presented with altered mental status. He does have significant past medical history for hypertension, dyslipidemia, type 2 diabetes mellitus, history of stroke, seizures anddementia. Patient was noted to have right-sided weakness, difficulty talking, andslurred speech. He had agitation and received recently combination ofSeroquel, alprazolam and Depakote. On his initial physical examination his blood pressure was 170/91, pulse rate 131, respiratory 27, oxygen saturation 96%. He was neurologically nonfocal, oriented only x1, his lungs were clear to auscultation bilaterally, heart S1-S2 present rhythmic, his abdomen soft, nolowerextremity edema.Sodium 158, potassium 4.9, chloride 129, bicarb 19, glucose 200, BUN 67, creatinine 1.5.Urinalysis had more than 50 white cells, 6-10 red cells, specific gravity more than 1.046.  Patient was admitted to the hospitalwith aworking diagnosis of metabolic encephalopathy complicated by hyponatremia, dehydration and acute kidney injury.   Patient developed persistent encephalopathy with featuresofdelirium, severe calorie protein malnutrition with swallow dysfunction. Now scheduled for PEG tube on 07.27.20.    Assessment & Plan:   Principal Problem:   Acute metabolic encephalopathy Active Problems:   Goals of care, counseling/discussion   Severe protein-calorie malnutrition (HCC)   Anemia   Palliative care by specialist   Vascular dementia without behavioral disturbance ([place])   Seizure ([place])   Dysphagia   Adult failure to thrive   Type 2 diabetes mellitus without complication (HCC)   Pressure injury of skin   Aspiration into airway   1. Acute metabolic encephalopathy with delirium, in the setting of vascular dementia and behavioral disturbance.Today with no agitation, continue  to be confused and disorientated, on tube feedings per NG. Continue with aspiration precautions. Will get peg tube today.    2. Hx of CVA with swallow dysfunction. Poor functional capacity, has been not doing progress with physical therapy. Non ambulatory and high risk for complications.   3. Seizures.Continue regimen with keppra and lacosemide, per enteric tube.  4. T2DM.continue with Insulin sliding scale.   5. Right heal deep tissue injury, not able to stage, Continue with local wound care.   6. Severe protein malnutrition. On nutritional supplements. His glucose has remained stable.   7.AKI. Clinically resolved. Renal function with serum cr today at 0,56 with K at 3,8 and serum bicarbonate at 25.   8. Obesity. BMI 41.5. poor mobility due to CVA.   9. HTN. Continue blood pressure control with lisinopril, metoprolol, hydralazine and amlodipine.    DVT prophylaxis:enoxaparin Code Status:full Family Communication:no family at the bedside Disposition Plan/ discharge barriers:pending placement, return to SNF.  Body mass index is 41.51 kg/m. Malnutrition Type:  Nutrition Problem: Inadequate oral intake Etiology: inability to eat   Malnutrition Characteristics:  Signs/Symptoms: NPO status   Nutrition Interventions:  Interventions: Refer to RD note for recommendations  RN Pressure Injury Documentation: Pressure Injury 12/26/18 Heel Right Deep Tissue Injury - Purple or maroon localized area of discolored intact skin or blood-filled blister due to damage of underlying soft tissue from pressure and/or shear. right heel deep tissue injury (Active)  12/26/18 1400  Location: Heel  Location Orientation: Right  Staging: Deep Tissue Injury - Purple or maroon localized area of discolored intact skin or blood-filled blister due to damage of underlying soft tissue from pressure and/or shear.  Wound Description (Comments): right heel deep tissue injury  Present on  Admission:      Consultants:   IR  Procedures:     Antimicrobials:  Subjective: Patient has been tolerating tube feedings well, no nausea or vomiting, no chest pain or dyspnea.   Objective: Vitals:   01/06/19 2353 01/07/19 0354 01/07/19 0500 01/07/19 0755  BP: (!) 150/74 (!) 177/88  (!) 134/57  Pulse: 83 89  95  Resp: 18 18  18   Temp: 99.1 F (37.3 C) 98 F (36.7 C)  98.7 F (37.1 C)  TempSrc: Oral Oral  Oral  SpO2: 100% 100%  98%  Weight:   123.8 kg     Intake/Output Summary (Last 24 hours) at 01/07/2019 1115 Last data filed at 01/07/2019 0410 Gross per 24 hour  Intake -  Output 1130 ml  Net -1130 ml   Filed Weights   01/05/19 0504 01/06/19 0340 01/07/19 0500  Weight: 123.8 kg 126.6 kg 123.8 kg    Examination:   General: deconditioned  Neurology: Awake and alert, responds to simple questions with yes and no.  E ENT: no pallor, no icterus, oral mucosa moist Cardiovascular: No JVD. S1-S2 present, rhythmic, no gallops, rubs, or murmurs. Trace lower extremity edema. Pulmonary: positive breath sounds bilaterally, adequate air movement, no wheezing, rhonchi or rales. Gastrointestinal. Abdomen with no organomegaly, non tender, no rebound or guarding Skin. No rashes Musculoskeletal: no joint deformities     Data Reviewed: I have personally reviewed following labs and imaging studies  CBC: Recent Labs  Lab 01/03/19 0346 01/04/19 0753 01/07/19 0525  WBC 14.0* 12.0* 12.3*  NEUTROABS 10.6*  --   --   HGB 7.9* 7.8* 8.0*  HCT 27.7* 26.6* 26.8*  MCV 81.5 79.4* 78.1*  PLT 349 309 324   Basic Metabolic Panel: Recent Labs  Lab 01/01/19 0416 01/03/19 0346 01/07/19 0525  NA  --  141 138  K  --  3.6 3.8  CL  --  108 105  CO2  --  23 25  GLUCOSE  --  167* 127*  BUN  --  14 11  CREATININE 0.93 0.58* 0.56*  CALCIUM  --  8.9 8.8*   GFR: Estimated Creatinine Clearance: 107 mL/min (A) (by C-G formula based on SCr of 0.56 mg/dL (L)). Liver Function  Tests: No results for input(s): AST, ALT, ALKPHOS, BILITOT, PROT, ALBUMIN in the last 168 hours. No results for input(s): LIPASE, AMYLASE in the last 168 hours. No results for input(s): AMMONIA in the last 168 hours. Coagulation Profile: Recent Labs  Lab 01/07/19 0525  INR 1.2   Cardiac Enzymes: No results for input(s): CKTOTAL, CKMB, CKMBINDEX, TROPONINI in the last 168 hours. BNP (last 3 results) No results for input(s): PROBNP in the last 8760 hours. HbA1C: No results for input(s): HGBA1C in the last 72 hours. CBG: Recent Labs  Lab 01/06/19 1227 01/06/19 1555 01/06/19 2001 01/06/19 2324 01/07/19 0401  GLUCAP 204* 157* 178* 176* 111*   Lipid Profile: No results for input(s): CHOL, HDL, LDLCALC, TRIG, CHOLHDL, LDLDIRECT in the last 72 hours. Thyroid Function Tests: No results for input(s): TSH, T4TOTAL, FREET4, T3FREE, THYROIDAB in the last 72 hours. Anemia Panel: No results for input(s): VITAMINB12, FOLATE, FERRITIN, TIBC, IRON, RETICCTPCT in the last 72 hours.    Radiology Studies: I have reviewed all of the imaging during this hospital visit personally     Scheduled Meds: . amLODipine  10 mg Per Tube Daily  . aspirin  325 mg Per Tube Daily   Or  . aspirin  300 mg Rectal Daily  . chlorhexidine  15 mL Mouth Rinse BID  . dorzolamide  1 drop Both Eyes  BID  . feeding supplement (PRO-STAT SUGAR FREE 64)  30 mL Per Tube Daily  . ferrous sulfate  300 mg Per Tube BID WC  . hydrALAZINE  100 mg Per Tube BID  . insulin aspart  0-9 Units Subcutaneous Q4H  . lacosamide  50 mg Per NG tube BID  . levETIRAcetam  500 mg Per Tube BID  . lisinopril  20 mg Per Tube Daily  . mouth rinse  15 mL Mouth Rinse q12n4p  . metoprolol tartrate  25 mg Per Tube BID  . nicotine  21 mg Transdermal Daily  . polyethylene glycol  17 g Per Tube BID  . senna  1 tablet Per Tube QHS  . sodium chloride flush  10-40 mL Intracatheter Q12H   Continuous Infusions: . feeding supplement (JEVITY 1.5  CAL/FIBER) 1,000 mL (01/06/19 1249)     LOS: 26 days        Dellanira Dillow Gerome Apley, MD

## 2019-01-08 LAB — GLUCOSE, CAPILLARY
Glucose-Capillary: 131 mg/dL — ABNORMAL HIGH (ref 70–99)
Glucose-Capillary: 133 mg/dL — ABNORMAL HIGH (ref 70–99)
Glucose-Capillary: 129 mg/dL — ABNORMAL HIGH (ref 70–99)
Glucose-Capillary: 118 mg/dL — ABNORMAL HIGH (ref 70–99)
Glucose-Capillary: 122 mg/dL — ABNORMAL HIGH (ref 70–99)
Glucose-Capillary: 107 mg/dL — ABNORMAL HIGH (ref 70–99)
Glucose-Capillary: 106 mg/dL — ABNORMAL HIGH (ref 70–99)

## 2019-01-08 LAB — CREATININE, SERUM
Creatinine, Ser: 0.68 mg/dL (ref 0.61–1.24)
GFR calc Af Amer: >60 mL/min (ref >60)
GFR calc non Af Amer: >60 mL/min (ref >60)

## 2019-01-08 NOTE — Progress Notes (Signed)
PROGRESS NOTE    Julian Tyler  ZYS:[id number] DOB: [birth year] DOA: 12/11/2018 PCP: Milana Na., MD    Brief Narrative:  73 year old male who presented with altered mental status. He does have significant past medical history for hypertension, dyslipidemia, type 2 diabetes mellitus, history of stroke, seizures anddementia. Patient was noted to have right-sided weakness, difficulty talking, andslurred speech. He had agitation and received recently combination ofSeroquel, alprazolam and Depakote. On his initial physical examination his blood pressure was 170/91, pulse rate 131, respiratory 27, oxygen saturation 96%. He was neurologically nonfocal, oriented only x1, his lungs were clear to auscultation bilaterally, heart S1-S2 present rhythmic, his abdomen soft, nolowerextremity edema.Sodium 158, potassium 4.9, chloride 129, bicarb 19, glucose 200, BUN 67, creatinine 1.5.Urinalysis had more than 50 white cells, 6-10 red cells, specific gravity more than 1.046.  Patient was admitted to the hospitalwith aworking diagnosis of metabolic encephalopathy complicated by hyponatremia, dehydration and acute kidney injury.   Patient developed persistent encephalopathy with featuresofdelirium, severe calorie protein malnutrition with swallow dysfunction. Now scheduled for PEG tube on 07.28.20.   Assessment & Plan:   Principal Problem:   Acute metabolic encephalopathy Active Problems:   Goals of care, counseling/discussion   Severe protein-calorie malnutrition (HCC)   Anemia   Palliative care by specialist   Vascular dementia without behavioral disturbance ([place])   Seizure ([place])   Dysphagia   Adult failure to thrive   Type 2 diabetes mellitus without complication (HCC)   Pressure injury of skin   Aspiration into airway   1. Acute metabolic encephalopathy with delirium, in the setting of vascular dementia and behavioral disturbance.Patient is somnolent today, he  removed with NG tube and not able to get medications by mouth. Plan for IR peg tube placement to continue nutritional support. Pending placement at SNF.   2. Hx of CVA with swallow dysfunction. pending PEG tube placement.   3. Seizures.Onkeppra and lacosemide. Continue neuro checks per unit protocol.   4. T2DM.Glucose has remained controlled will plan to continue insulin sliding scale. Capillary glucose at 133, 129, 118.   5. Right heal deep tissue injury, not able to stage.  Continue with local wound care. Patient not ambulatory.   6. Severe protein malnutrition.continue nutritional supplements per tube.  7.AKI. resolved.  8. Obesity. BMI 38.16follow as outpatient.   9. HTN.Onlisinopril, metoprolol, hydralazine and amlodipine for blood pressure control.    DVT prophylaxis:enoxaparin Code Status:full Family Communication:no family at the bedside Disposition Plan/ discharge barriers:pending placement, return to SNF.   Body mass index is 38.01 kg/m. Malnutrition Type:  Nutrition Problem: Inadequate oral intake Etiology: inability to eat   Malnutrition Characteristics:  Signs/Symptoms: NPO status   Nutrition Interventions:  Interventions: Refer to RD note for recommendations  RN Pressure Injury Documentation: Pressure Injury 12/26/18 Heel Right Deep Tissue Injury - Purple or maroon localized area of discolored intact skin or blood-filled blister due to damage of underlying soft tissue from pressure and/or shear. right heel deep tissue injury (Active)  12/26/18 1400  Location: Heel  Location Orientation: Right  Staging: Deep Tissue Injury - Purple or maroon localized area of discolored intact skin or blood-filled blister due to damage of underlying soft tissue from pressure and/or shear.  Wound Description (Comments): right heel deep tissue injury  Present on Admission:      Consultants:   IR   Procedures:     Antimicrobials:        Subjective: Patient removed NG tube yesterday, this am is somnolent, opens eyes to  touch, not following commands.   Objective: Vitals:   01/08/19 0003 01/08/19 0309 01/08/19 0417 01/08/19 0824  BP: 105/77 (!) 155/66  (!) 174/77  Pulse: 98 98  80  Resp: 18 18  16   Temp: 99.2 F (37.3 C) 99.3 F (37.4 C)  98.8 F (37.1 C)  TempSrc: Oral Oral  Axillary  SpO2: 100% 98%  97%  Weight:   113.4 kg     Intake/Output Summary (Last 24 hours) at 01/08/2019 0839 Last data filed at 01/07/2019 1810 Gross per 24 hour  Intake --  Output 2 ml  Net -2 ml   Filed Weights   01/06/19 0340 01/07/19 0500 01/08/19 0417  Weight: 126.6 kg 123.8 kg 113.4 kg    Examination:   General: deconditioned  Neurology: somnolent  E ENT: mild pallor, no icterus, oral mucosa moist Cardiovascular: No JVD. S1-S2 present, rhythmic, no gallops, rubs, or murmurs. No lower extremity edema. Pulmonary: positive breath sounds bilaterally, adequate air movement, no wheezing, rhonchi or rales. Gastrointestinal. Abdomen distended no organomegaly, non tender, no rebound or guarding Skin. No rashes Musculoskeletal: no joint deformities     Data Reviewed: I have personally reviewed following labs and imaging studies  CBC: Recent Labs  Lab 01/03/19 0346 01/04/19 0753 01/07/19 0525  WBC 14.0* 12.0* 12.3*  NEUTROABS 10.6*  --   --   HGB 7.9* 7.8* 8.0*  HCT 27.7* 26.6* 26.8*  MCV 81.5 79.4* 78.1*  PLT 349 309 546   Basic Metabolic Panel: Recent Labs  Lab 01/03/19 0346 01/07/19 0525 01/08/19 0351  NA 141 138  --   K 3.6 3.8  --   CL 108 105  --   CO2 23 25  --   GLUCOSE 167* 127*  --   BUN 14 11  --   CREATININE 0.58* 0.56* 0.68  CALCIUM 8.9 8.8*  --    GFR: Estimated Creatinine Clearance: 102 mL/min (by C-G formula based on SCr of 0.68 mg/dL). Liver Function Tests: No results for input(s): AST, ALT, ALKPHOS, BILITOT, PROT, ALBUMIN in the last 168 hours. No results for input(s): LIPASE, AMYLASE  in the last 168 hours. No results for input(s): AMMONIA in the last 168 hours. Coagulation Profile: Recent Labs  Lab 01/07/19 0525  INR 1.2   Cardiac Enzymes: No results for input(s): CKTOTAL, CKMB, CKMBINDEX, TROPONINI in the last 168 hours. BNP (last 3 results) No results for input(s): PROBNP in the last 8760 hours. HbA1C: No results for input(s): HGBA1C in the last 72 hours. CBG: Recent Labs  Lab 01/07/19 1548 01/07/19 2004 01/08/19 0001 01/08/19 0351 01/08/19 0723  GLUCAP 150* 130* 131* 133* 129*   Lipid Profile: No results for input(s): CHOL, HDL, LDLCALC, TRIG, CHOLHDL, LDLDIRECT in the last 72 hours. Thyroid Function Tests: No results for input(s): TSH, T4TOTAL, FREET4, T3FREE, THYROIDAB in the last 72 hours. Anemia Panel: No results for input(s): VITAMINB12, FOLATE, FERRITIN, TIBC, IRON, RETICCTPCT in the last 72 hours.    Radiology Studies: I have reviewed all of the imaging during this hospital visit personally     Scheduled Meds:  amLODipine  10 mg Per Tube Daily   aspirin  325 mg Per Tube Daily   Or   aspirin  300 mg Rectal Daily   chlorhexidine  15 mL Mouth Rinse BID   dorzolamide  1 drop Both Eyes BID   feeding supplement (PRO-STAT SUGAR FREE 64)  30 mL Per Tube Daily   ferrous sulfate  300 mg Per Tube BID WC  hydrALAZINE  100 mg Per Tube BID   insulin aspart  0-9 Units Subcutaneous Q4H   lacosamide  50 mg Per NG tube BID   levETIRAcetam  500 mg Per Tube BID   lisinopril  20 mg Per Tube Daily   mouth rinse  15 mL Mouth Rinse q12n4p   metoprolol tartrate  25 mg Per Tube BID   nicotine  21 mg Transdermal Daily   polyethylene glycol  17 g Per Tube BID   senna  1 tablet Per Tube QHS   sodium chloride flush  10-40 mL Intracatheter Q12H   Continuous Infusions:  feeding supplement (JEVITY 1.5 CAL/FIBER) 1,000 mL (01/06/19 1249)     LOS: 27 days        Nathalya Wolanski Gerome Apley, MD

## 2019-01-08 NOTE — Progress Notes (Signed)
SLP Cancellation Note  Patient Details Name: Julian Tyler MRN: [id number] DOB: 05-Jan-1946   Cancelled treatment:       Reason Eval/Treat Not Completed: Other (comment)(Patient NPO pending PEG today (7/28) and plan for discharge back to SNF. Patient has not made progress towards PO tolerance which is largely based on his fluctuating alertness. He will benefit from SNF level SLP services for dysphagia) SLP to s/o at this time but can be reordered if change in patient status. Thank you!   Nadara Mode Tarrell 01/08/2019, 10:25 AM  [name], MA, CCC-SLP Speech Therapy [facility] Acute Rehab Pager: [phone]

## 2019-01-08 NOTE — Progress Notes (Signed)
IR.  Patient was scheduled for an image-guided percutaneous gastrostomy tube placement tentative for today in IR. Due to scheduling issues, procedure has been rescheduled for 01/09/2019. Patient to be NPO at midnight. 3W RN aware.  Please call IR with questions/concerns.  Bea Graff Dante Roudebush, PA-C 01/08/2019, 4:29 PM

## 2019-01-08 NOTE — Progress Notes (Signed)
Chart reviewed; B Linda Biehn RN,MHA,BSN  [employer] [facility] [employer] [phone] 

## 2019-01-08 NOTE — Progress Notes (Signed)
Called for pt to come to radiology for procedure. Confirmed that pt does not have an IV. Nurse states that she will put in IV Team consult and she will call me at [phone] when the IV is in place. Advised that we will send for pt to come down if IR schedule allows.

## 2019-01-09 ENCOUNTER — Encounter (HOSPITAL_COMMUNITY): Payer: Self-pay | Admitting: Interventional Radiology

## 2019-01-09 ENCOUNTER — Inpatient Hospital Stay (HOSPITAL_COMMUNITY): Payer: Medicare Other

## 2019-01-09 HISTORY — PX: IR GASTROSTOMY TUBE MOD SED: IMG625

## 2019-01-09 LAB — GLUCOSE, CAPILLARY
Glucose-Capillary: 106 mg/dL — ABNORMAL HIGH (ref 70–99)
Glucose-Capillary: 115 mg/dL — ABNORMAL HIGH (ref 70–99)
Glucose-Capillary: 133 mg/dL — ABNORMAL HIGH (ref 70–99)
Glucose-Capillary: 97 mg/dL (ref 70–99)
Glucose-Capillary: 97 mg/dL (ref 70–99)
Glucose-Capillary: 99 mg/dL (ref 70–99)

## 2019-01-09 MED ORDER — KETOROLAC TROMETHAMINE 30 MG/ML IJ SOLN
INTRAMUSCULAR | Status: AC
  Administered 2019-01-09: 30 mg via INTRAVENOUS
  Filled 2019-01-09: qty 1

## 2019-01-09 MED ORDER — MIDAZOLAM HCL 2 MG/2ML IJ SOLN
INTRAMUSCULAR | Status: AC
  Filled 2019-01-09: qty 2

## 2019-01-09 MED ORDER — LIDOCAINE HCL (PF) 1 % IJ SOLN
INTRAMUSCULAR | Status: AC | PRN
  Administered 2019-01-09: 5 mL

## 2019-01-09 MED ORDER — LIDOCAINE HCL 1 % IJ SOLN
INTRAMUSCULAR | Status: AC
  Filled 2019-01-09: qty 20

## 2019-01-09 MED ORDER — MIDAZOLAM HCL 2 MG/2ML IJ SOLN
INTRAMUSCULAR | Status: AC | PRN
  Administered 2019-01-09 (×2): 1 mg via INTRAVENOUS

## 2019-01-09 MED ORDER — IOHEXOL 300 MG/ML  SOLN: 50.0000 mL | Freq: Once | INTRAMUSCULAR | Status: DC | PRN

## 2019-01-09 MED ORDER — CEFAZOLIN SODIUM-DEXTROSE 2-4 GM/100ML-% IV SOLN
2.0000 g | INTRAVENOUS | Status: AC
  Administered 2019-01-09: 2 g via INTRAVENOUS
  Filled 2019-01-09: qty 100

## 2019-01-09 MED ORDER — BACITRACIN-NEOMYCIN-POLYMYXIN 400-5-5000 EX OINT
1.0000 "application " | TOPICAL_OINTMENT | Freq: Every day | CUTANEOUS | Status: AC
  Administered 2019-01-11 – 2019-01-15 (×4): 1 via TOPICAL
  Filled 2019-01-09 (×3): qty 1

## 2019-01-09 MED ORDER — HYDRALAZINE HCL 20 MG/ML IJ SOLN: 10.0000 mg | INTRAMUSCULAR | Status: DC | PRN

## 2019-01-09 MED ORDER — KETOROLAC TROMETHAMINE 30 MG/ML IJ SOLN
30.0000 mg | Freq: Once | INTRAMUSCULAR | Status: AC
  Administered 2019-01-09: 14:00:00 30 mg via INTRAVENOUS

## 2019-01-09 MED ORDER — LACTATED RINGERS IV SOLN
INTRAVENOUS | Status: DC
  Administered 2019-01-09 – 2019-01-10 (×2): via INTRAVENOUS

## 2019-01-09 MED ORDER — CEFAZOLIN SODIUM-DEXTROSE 2-4 GM/100ML-% IV SOLN
INTRAVENOUS | Status: AC
  Administered 2019-01-09: 2 g via INTRAVENOUS
  Filled 2019-01-09: qty 100

## 2019-01-09 MED ORDER — LEVETIRACETAM IN NACL 500 MG/100ML IV SOLN
500.0000 mg | Freq: Two times a day (BID) | INTRAVENOUS | Status: DC
  Administered 2019-01-09 – 2019-01-10 (×2): 500 mg via INTRAVENOUS
  Filled 2019-01-09 (×2): qty 100

## 2019-01-09 MED ORDER — SODIUM CHLORIDE 0.9 % IV SOLN
50.0000 mg | Freq: Two times a day (BID) | INTRAVENOUS | Status: DC
  Administered 2019-01-09: 17:00:00 50 mg via INTRAVENOUS
  Filled 2019-01-09 (×3): qty 5

## 2019-01-09 MED ORDER — GLUCAGON HCL RDNA (DIAGNOSTIC) 1 MG IJ SOLR
INTRAMUSCULAR | Status: AC
  Filled 2019-01-09: qty 1

## 2019-01-09 MED ORDER — FENTANYL CITRATE (PF) 100 MCG/2ML IJ SOLN
INTRAMUSCULAR | Status: AC
  Filled 2019-01-09: qty 2

## 2019-01-09 MED ORDER — FENTANYL CITRATE (PF) 100 MCG/2ML IJ SOLN
INTRAMUSCULAR | Status: AC | PRN
  Administered 2019-01-09 (×2): 50 ug via INTRAVENOUS

## 2019-01-09 NOTE — Progress Notes (Signed)
PROGRESS NOTE    Julian Tyler  IDP:[id number] DOB: 04-27-1946 DOA: 12/11/2018 PCP: Milana Na., MD    Brief Narrative:  73 year old male who presented with altered mental status. He does have significant past medical history for hypertension, dyslipidemia, type 2 diabetes mellitus, history of stroke, seizures anddementia. Patient was noted to have right-sided weakness, difficulty talking, andslurred speech. He had agitation and received recently combination ofSeroquel, alprazolam and Depakote. On his initial physical examination his blood pressure was 170/91, pulse rate 131, respiratory 27, oxygen saturation 96%. He was neurologically nonfocal, oriented only x1, his lungs were clear to auscultation bilaterally, heart S1-S2 present rhythmic, his abdomen soft, nolowerextremity edema.Sodium 158, potassium 4.9, chloride 129, bicarb 19, glucose 200, BUN 67, creatinine 1.5.Urinalysis had more than 50 white cells, 6-10 red cells, specific gravity more than 1.046.  Patient was admitted to the hospitalwith aworking diagnosis of metabolic encephalopathy complicated by hyponatremia, dehydration and acute kidney injury.   Patient developed persistent encephalopathy with featuresofdelirium, severe calorie protein malnutrition with swallow dysfunction. Now scheduled for PEG tube on 07.28.20.    Assessment & Plan:   Principal Problem:   Acute metabolic encephalopathy Active Problems:   Goals of care, counseling/discussion   Severe protein-calorie malnutrition (HCC)   Anemia   Palliative care by specialist   Vascular dementia without behavioral disturbance ([place])   Seizure ([place])   Dysphagia   Adult failure to thrive   Type 2 diabetes mellitus without complication (HCC)   Pressure injury of skin   Aspiration into airway    1. Acute metabolic encephalopathy with delirium, in the setting of vascular dementia and behavioral disturbance. Patient this am is more awake  and reactive, he does follows commands and answers to yes and no questions. Continue to be NPO for IR peg tube placement. Will add gentle IV fluids while NPO.   2. Hx of CVA with swallow dysfunction.Will need PEG tube placement.. Plan for SNF.   3. Seizures.Continue withkeppra and lacosemide. No active seizures.   4. T2DM.Continue with glucose cover and monitoring with insulin sliding scale. Glucose has remained well controlled.   5. Right heal deep tissue injury, not able to stage.  Local wound care. Patient has remained not ambulatory.   6. Severe protein malnutrition.On nutritional supplements per tube, today will get PEG tube.   7.AKI. Clinically resolved.   8. Obesity. BMI 38.0.   9. HTN.Blood pressure has been elevated to 443 and 154 systolic because no enteral port. Today will get peg tube and will continue withlisinopril, metoprolol, hydralazine and amlodipine.   DVT prophylaxis:enoxaparin Code Status:full Family Communication:no family at the bedside Disposition Plan/ discharge barriers:pending placement, return to SNF.     Body mass index is 40.14 kg/m. Malnutrition Type:  Nutrition Problem: Inadequate oral intake Etiology: inability to eat   Malnutrition Characteristics:  Signs/Symptoms: NPO status   Nutrition Interventions:  Interventions: Refer to RD note for recommendations  RN Pressure Injury Documentation: Pressure Injury 12/26/18 Heel Right Deep Tissue Injury - Purple or maroon localized area of discolored intact skin or blood-filled blister due to damage of underlying soft tissue from pressure and/or shear. right heel deep tissue injury (Active)  12/26/18 1400  Location: Heel  Location Orientation: Right  Staging: Deep Tissue Injury - Purple or maroon localized area of discolored intact skin or blood-filled blister due to damage of underlying soft tissue from pressure and/or shear.  Wound Description (Comments): right heel  deep tissue injury  Present on Admission:  Consultants:   IR   Procedures:   Plan for peg tube today.   Antimicrobials:       Subjective: Patient continue to be NPO for peg tube. This am is calm and not agitated, answers simple yes and no questions, no nausea or vomiting, no chest pain.   Objective: Vitals:   01/08/19 1935 01/08/19 2324 01/09/19 0334 01/09/19 0800  BP: (!) 144/79 (!) 151/67 (!) 159/77 (!) 159/82  Pulse: 89 91 86 92  Resp: 18 18 18 16   Temp: 98.3 F (36.8 C) 98 F (36.7 C) 98.3 F (36.8 C) 98.6 F (37 C)  TempSrc: Oral Oral Oral Oral  SpO2: 98% 97% 98% 100%  Weight:   119.7 kg     Intake/Output Summary (Last 24 hours) at 01/09/2019 1040 Last data filed at 01/09/2019 0335 Gross per 24 hour  Intake -  Output 2475 ml  Net -2475 ml   Filed Weights   01/07/19 0500 01/08/19 0417 01/09/19 0334  Weight: 123.8 kg 113.4 kg 119.7 kg    Examination:   General: deconditioned  Neurology: Awake and alert, non focal  E ENT: mild pallor, no icterus, oral mucosa moist Cardiovascular: No JVD. S1-S2 present, rhythmic, no gallops, rubs, or murmurs. No lower extremity edema. Pulmonary: positive breath sounds bilaterally, adequate air movement, no wheezing, rhonchi or rales. Gastrointestinal. Abdomen with no organomegaly, non tender, no rebound or guarding. Mild distention.  Skin. No rashes Musculoskeletal: no joint deformities     Data Reviewed: I have personally reviewed following labs and imaging studies  CBC: Recent Labs  Lab 01/03/19 0346 01/04/19 0753 01/07/19 0525  WBC 14.0* 12.0* 12.3*  NEUTROABS 10.6*  --   --   HGB 7.9* 7.8* 8.0*  HCT 27.7* 26.6* 26.8*  MCV 81.5 79.4* 78.1*  PLT 349 309 696   Basic Metabolic Panel: Recent Labs  Lab 01/03/19 0346 01/07/19 0525 01/08/19 0351  NA 141 138  --   K 3.6 3.8  --   CL 108 105  --   CO2 23 25  --   GLUCOSE 167* 127*  --   BUN 14 11  --   CREATININE 0.58* 0.56* 0.68  CALCIUM 8.9  8.8*  --    GFR: Estimated Creatinine Clearance: 105.1 mL/min (by C-G formula based on SCr of 0.68 mg/dL). Liver Function Tests: No results for input(s): AST, ALT, ALKPHOS, BILITOT, PROT, ALBUMIN in the last 168 hours. No results for input(s): LIPASE, AMYLASE in the last 168 hours. No results for input(s): AMMONIA in the last 168 hours. Coagulation Profile: Recent Labs  Lab 01/07/19 0525  INR 1.2   Cardiac Enzymes: No results for input(s): CKTOTAL, CKMB, CKMBINDEX, TROPONINI in the last 168 hours. BNP (last 3 results) No results for input(s): PROBNP in the last 8760 hours. HbA1C: No results for input(s): HGBA1C in the last 72 hours. CBG: Recent Labs  Lab 01/08/19 1635 01/08/19 1935 01/08/19 2322 01/09/19 0332 01/09/19 0800  GLUCAP 122* 107* 106* 99 106*   Lipid Profile: No results for input(s): CHOL, HDL, LDLCALC, TRIG, CHOLHDL, LDLDIRECT in the last 72 hours. Thyroid Function Tests: No results for input(s): TSH, T4TOTAL, FREET4, T3FREE, THYROIDAB in the last 72 hours. Anemia Panel: No results for input(s): VITAMINB12, FOLATE, FERRITIN, TIBC, IRON, RETICCTPCT in the last 72 hours.    Radiology Studies: I have reviewed all of the imaging during this hospital visit personally     Scheduled Meds: . amLODipine  10 mg Per Tube Daily  . aspirin  325 mg Per Tube Daily   Or  . aspirin  300 mg Rectal Daily  . chlorhexidine  15 mL Mouth Rinse BID  . dorzolamide  1 drop Both Eyes BID  . feeding supplement (PRO-STAT SUGAR FREE 64)  30 mL Per Tube Daily  . ferrous sulfate  300 mg Per Tube BID WC  . hydrALAZINE  100 mg Per Tube BID  . insulin aspart  0-9 Units Subcutaneous Q4H  . lacosamide  50 mg Per NG tube BID  . levETIRAcetam  500 mg Per Tube BID  . lisinopril  20 mg Per Tube Daily  . mouth rinse  15 mL Mouth Rinse q12n4p  . metoprolol tartrate  25 mg Per Tube BID  . nicotine  21 mg Transdermal Daily  . polyethylene glycol  17 g Per Tube BID  . senna  1 tablet Per  Tube QHS  . sodium chloride flush  10-40 mL Intracatheter Q12H   Continuous Infusions: . feeding supplement (JEVITY 1.5 CAL/FIBER) 1,000 mL (01/06/19 1249)  . lactated ringers       LOS: 28 days        Mauricio Gerome Apley, MD

## 2019-01-09 NOTE — Progress Notes (Signed)
Occupational Therapy Treatment Patient Details Name: Julian Tyler MRN: [id number] DOB: 1945/07/27 Today's Date: 01/09/2019    History of present illness Pt is a 73 y/o nursing home resident who presents with AMS. MRI negative, and acute metabolic encephalopathy suspected to be 2 dehydration and hypernatremia. Possible UTI as well. PMH: stroke, CKD, Dry eyes, DM, hyperlipidemia   OT comments  Pt performing minimal exertion of activity- no gains have been made in over 2 weeks. Therapeutic exercise performed and pt unwilling to fully participate asking about his laundry being done in the other room. No further OT skilled services are required. Pt is totalA for self care and Mobility.  OT signing off. Pt is not appropriate for skilled OT at this time.   Follow Up Recommendations  No OT follow up(Pt  does not require skilled OT at this time)    Equipment Recommendations  None recommended by OT    Recommendations for Other Services      Precautions / Restrictions Precautions Precautions: Fall;Other (comment) Precaution Comments: mitts on hands and PEG tube Restrictions Weight Bearing Restrictions: No       Mobility Bed Mobility Overal bed mobility: Needs Assistance             General bed mobility comments: Pt in chair position for session and released to lying flatter upon leaving room.  Transfers                 General transfer comment: Pt is appropriate for transition OOB to chair with maxi-move with nursing staff. Recommend Geomat for chair if getting OOB.     Balance                                           ADL either performed or assessed with clinical judgement   ADL Overall ADL's : Needs assistance/impaired Eating/Feeding: Total assistance                                   Functional mobility during ADLs: Total assistance General ADL Comments: No further ADL required- pt is totalA without mitts. Pt requires assist  with all functional tasks and continues to pull at lines.     Vision   Vision Assessment?: Vision impaired- to be further tested in functional context   Perception     Praxis      Cognition Arousal/Alertness: Lethargic Behavior During Therapy: Flat affect Overall Cognitive Status: Difficult to assess                         Following Commands: Follows one step commands inconsistently                Exercises Exercises: Other exercises Other Exercises Other Exercises: shoulder flex, elbow flex/ext and hand squeezes 5 reps each exercise   Shoulder Instructions       General Comments Pt performing minimal exertion of activity- no gains have been made in over 2 weeks. OT signing off. Pt is not appropriate for skilled OT at this time.    Pertinent Vitals/ Pain       Pain Assessment: Faces Faces Pain Scale: No hurt  Home Living  Prior Functioning/Environment              Frequency           Progress Toward Goals  OT Goals(current goals can now be found in the care plan section)  Progress towards OT goals: Not progressing toward goals - comment(OT signing off)  Acute Rehab OT Goals Patient Stated Goal: unable to state goals  OT Goal Formulation: Patient unable to participate in goal setting Time For Goal Achievement: 01/17/19 Potential to Achieve Goals: Poor ADL Goals Pt Will Perform Grooming: with modified independence;bed level Pt Will Transfer to Toilet: with mod assist;with +2 assist;squat pivot transfer;bedside commode Additional ADL Goal #1: Pt will attend to task in 3/3 trials with mutlimodal cues to continue to attend to task, Additional ADL Goal #2: Pt modA for ADL at bed level or sitting EOB.  Plan All goals met and education completed, patient discharged from OT services;Discharge plan needs to be updated    Co-evaluation                 AM-PAC OT "6 Clicks" Daily  Activity     Outcome Measure   Help from another person eating meals?: Total Help from another person taking care of personal grooming?: Total Help from another person toileting, which includes using toliet, bedpan, or urinal?: Total Help from another person bathing (including washing, rinsing, drying)?: Total Help from another person to put on and taking off regular upper body clothing?: Total Help from another person to put on and taking off regular lower body clothing?: Total 6 Click Score: 6    End of Session    OT Visit Diagnosis: Muscle weakness (generalized) (M62.81);Cognitive communication deficit (R41.841) Symptoms and signs involving cognitive functions: Other cerebrovascular disease   Activity Tolerance Patient limited by fatigue;Patient limited by lethargy   Patient Left in bed;with call bell/phone within reach;with bed alarm set;with SCD's reapplied   Nurse Communication Mobility status        Time: 2595-6387 OT Time Calculation (min): 18 min  Charges: OT General Charges $OT Visit: 1 Visit OT Treatments $Therapeutic Exercise: 8-22 mins  [name]) Marsa Aris OTR/L Acute Rehabilitation Services Pager: [phone] Office: [phone]    Jenene Slicker Caelen Reierson 01/09/2019, 4:40 PM

## 2019-01-09 NOTE — Progress Notes (Signed)
Dr. Baltazar Najjar informed of pt's inability to receive PO meds d/t PEG being on low suction until 0300  01/10/19 and pt being NPO.

## 2019-01-09 NOTE — Progress Notes (Signed)
G port has been connected to low wall suction. Nurse will continue to monitor. [place]

## 2019-01-09 NOTE — Sedation Documentation (Addendum)
Not6ified Dr Earleen Newport of elevated BP and tachycardia (see vitals).  Order received to admin toradol IV 30mg  x1 now and to notify patients primary nurse.

## 2019-01-09 NOTE — Sedation Documentation (Signed)
Pt in IR Room 1, supine on table, secured with strap.  Pt placed on cont cardiac monitoring.  Pt placed on 2L O2 via [place]

## 2019-01-09 NOTE — Procedures (Signed)
Interventional Radiology Procedure Note  Procedure: Placement of percutaneous 20F pull-through gastrostomy tube. Complications: None Recommendations: - NPO except for sips and chips remainder of today and overnight - Maintain G-tube to LWS until tomorrow morning  - May advance diet as tolerated and begin using tube tomorrow morning  Signed,   Murphy Duzan S. Tristin Gladman, DO   

## 2019-01-10 DIAGNOSIS — Z931 Gastrostomy status: Secondary | ICD-10-CM

## 2019-01-10 LAB — GLUCOSE, CAPILLARY
Glucose-Capillary: 94 mg/dL (ref 70–99)
Glucose-Capillary: 133 mg/dL — ABNORMAL HIGH (ref 70–99)
Glucose-Capillary: 137 mg/dL — ABNORMAL HIGH (ref 70–99)
Glucose-Capillary: 108 mg/dL — ABNORMAL HIGH (ref 70–99)
Glucose-Capillary: 157 mg/dL — ABNORMAL HIGH (ref 70–99)
Glucose-Capillary: 150 mg/dL — ABNORMAL HIGH (ref 70–99)

## 2019-01-10 MED ORDER — LISINOPRIL 20 MG PO TABS: 20.0000 mg | ORAL_TABLET | Freq: Every day | ORAL | 0 refills | Status: AC

## 2019-01-10 MED ORDER — LACOSAMIDE 50 MG PO TABS
50.0000 mg | ORAL_TABLET | Freq: Two times a day (BID) | ORAL | Status: DC
  Administered 2019-01-10 – 2019-01-24 (×29): 50 mg
  Filled 2019-01-10 (×29): qty 1

## 2019-01-10 MED ORDER — AMLODIPINE BESYLATE 10 MG PO TABS: 10.0000 mg | ORAL_TABLET | Freq: Every day | ORAL | 0 refills | Status: AC

## 2019-01-10 MED ORDER — ASPIRIN 325 MG PO TABS: 325.0000 mg | ORAL_TABLET | Freq: Every day | ORAL | 0 refills | Status: AC

## 2019-01-10 MED ORDER — OXYCODONE-ACETAMINOPHEN 5-325 MG PO TABS
1.0000 | ORAL_TABLET | Freq: Four times a day (QID) | ORAL | Status: DC | PRN
  Administered 2019-01-10 – 2019-01-11 (×3): 1
  Filled 2019-01-10 (×3): qty 1

## 2019-01-10 MED ORDER — PRAVASTATIN SODIUM 40 MG PO TABS: 40.0000 mg | ORAL_TABLET | Freq: Every day | ORAL | 0 refills | Status: AC

## 2019-01-10 MED ORDER — HYDRALAZINE HCL 100 MG PO TABS: 100.0000 mg | ORAL_TABLET | Freq: Two times a day (BID) | ORAL | 0 refills | Status: AC

## 2019-01-10 MED ORDER — OXYCODONE HCL 5 MG PO TABS
5.0000 mg | ORAL_TABLET | Freq: Four times a day (QID) | ORAL | Status: DC | PRN
  Administered 2019-01-11 – 2019-01-13 (×3): 5 mg via ORAL
  Filled 2019-01-10 (×3): qty 1

## 2019-01-10 MED ORDER — LEVETIRACETAM 100 MG/ML PO SOLN: 500.0000 mg | Freq: Two times a day (BID) | ORAL | 0 refills | Status: AC

## 2019-01-10 MED ORDER — METFORMIN HCL 500 MG PO TABS: 500.0000 mg | ORAL_TABLET | Freq: Two times a day (BID) | ORAL | 0 refills | Status: AC

## 2019-01-10 MED ORDER — JEVITY 1.5 CAL/FIBER PO LIQD: 1000.0000 mL | ORAL | 0 refills | Status: AC

## 2019-01-10 MED ORDER — FERROUS SULFATE 300 (60 FE) MG/5ML PO SYRP: 300.0000 mg | ORAL_SOLUTION | Freq: Two times a day (BID) | ORAL | 0 refills | Status: AC

## 2019-01-10 MED ORDER — METOPROLOL TARTRATE 25 MG PO TABS: 25.0000 mg | ORAL_TABLET | Freq: Two times a day (BID) | ORAL | 0 refills | Status: AC

## 2019-01-10 MED ORDER — PRO-STAT SUGAR FREE PO LIQD: 30.0000 mL | Freq: Every day | ORAL | 0 refills | Status: AC

## 2019-01-10 MED ORDER — POLYETHYLENE GLYCOL 3350 17 G PO PACK: 17.0000 g | PACK | Freq: Two times a day (BID) | ORAL | 0 refills | Status: AC

## 2019-01-10 MED ORDER — LACOSAMIDE 50 MG PO TABS: 50.0000 mg | ORAL_TABLET | Freq: Two times a day (BID) | ORAL | 0 refills | Status: DC

## 2019-01-10 MED ORDER — LEVETIRACETAM 100 MG/ML PO SOLN
500.0000 mg | Freq: Two times a day (BID) | ORAL | Status: DC
  Administered 2019-01-10 – 2019-01-24 (×29): 500 mg
  Filled 2019-01-10 (×30): qty 5

## 2019-01-10 NOTE — Progress Notes (Addendum)
Palliative medicine progress note  73 year old man complex past medical history including stroke with residual right-sided deficits, diabetes mellitus, dementia, recent complex hospitalization where he was treated for seizures and delirium in the setting of dementia who presented to the emergency department 6/30 as a code stroke, reported left facial droop and left arm weakness.   Continued physical, functional and cognitive decline in spite of medical interventions  Day 28 of hospital stay.  PEG placed and patient is tolorating feeds  Patient remains high risk for aspiration and it is unlikely he will be able to support himself nutritionally or from a hydration standpoint, now or at any time in the future.     Family remains hopeful for Improvement   Patient intermittently follow simple commands, minimal interaction with his environment   Will attempt to communicate with  Mr. Darel Hong this evening regarding current medical situation.  (best time to reach is after 5pm)   PMT will continue to support holistically    Disposition  Family is requesting a SNF in the [facility] area in discharge. They will refused placement at [facility]-   Total Time:  15 minutes  Greater than 50%  of this time was spent counseling and coordinating care related to the above assessment and plan.  Wadie Lessen NP  Palliative Medicine Team Team Phone # [phone] Pager  [phone]

## 2019-01-10 NOTE — Plan of Care (Signed)
  Problem: Activity: Goal: Risk for activity intolerance will decrease Outcome: Progressing   Problem: Coping: Goal: Level of anxiety will decrease Outcome: Progressing   Problem: Pain Managment: Goal: General experience of comfort will improve Outcome: Progressing   Problem: Education: Goal: Knowledge of disease or condition will improve Outcome: Progressing Goal: Knowledge of secondary prevention will improve Outcome: Progressing Goal: Knowledge of patient specific risk factors addressed and post discharge goals established will improve Outcome: Progressing   Problem: Nutrition: Goal: Risk of aspiration will decrease Outcome: Progressing Goal: Dietary intake will improve Outcome: Progressing   Problem: Nutrition Goal: Nutritional status is improving Description: Monitor and assess patient for malnutrition (ex- brittle hair, bruises, dry skin, pale skin and conjunctiva, muscle wasting, smooth red tongue, and disorientation). Collaborate with interdisciplinary team and initiate plan and interventions as ordered.  Monitor patient's weight and dietary intake as ordered or per policy. Utilize nutrition screening tool and intervene per policy. Determine patient's food preferences and provide high-protein, high-caloric foods as appropriate.  Outcome: Progressing   Julian Tyler, BSN, RN

## 2019-01-10 NOTE — Care Management Important Message (Signed)
Important Message  Patient Details  Name: Julian Tyler MRN: [id number] Date of Birth: 05-29-1946   Medicare Important Message Given:  Yes     Orbie Pyo 01/10/2019, 12:28 PM

## 2019-01-10 NOTE — Plan of Care (Signed)
  Problem: Pain Managment: Goal: General experience of comfort will improve Outcome: Progressing   

## 2019-01-10 NOTE — TOC Progression Note (Signed)
Transition of Care [facility]) - Progression Note    Patient Details  Name: Julian Tyler MRN: [id number] Date of Birth: 1946/03/31  Transition of Care [facility]) CM/SW [place], [place] Phone Number: 01/10/2019, 8:23 PM  Clinical Narrative:   CSW spoke with Britney at [facility], and they have agreed to offer a bed for the patient. This is the only bed offer for the patient received in [facility] so far. CSW faxed out patient to [facility], as well. CSW attempted to call the son to discuss the bed offer, no answer and unable to leave a voicemail. CSW to follow.    Expected Discharge Plan: [place] Barriers to Discharge: Continued Medical Work up, Ship broker  Expected Discharge Plan and Services Expected Discharge Plan: [place] Choice: [place] Living arrangements for the past 2 months: [place] Expected Discharge Date: 01/10/19                                     Social Determinants of Health (SDOH) Interventions    Readmission Risk Interventions No flowsheet data found.

## 2019-01-10 NOTE — Discharge Summary (Signed)
Physician Discharge Summary  Julian Tyler GHW:[id number] DOB: [birth year] DOA: 12/11/2018  PCP: Milana Na., MD  Admit date: 12/11/2018 Discharge date: 01/10/2019  Admitted From: SNF  Disposition:  SNF  Recommendations for Outpatient Follow-up and new medication changes:  1. Follow up with Dr. Harlow Asa in 7 days.  2. Patient had PEG tube placed for nutrition.  3. Discontinue quetiapine and divaloprax.  4. Decreased dose of Keppra to 500 mg bid and lacosamide to 50 mg bid.  5. Holding basal insulin to prevent hypoglycemia.   Home Health: Na  Equipment/Devices: na    Discharge Condition: stable  CODE STATUS: full  Diet recommendation: tube feedings.   Brief/Interim Summary: 73 year old male who presented with altered mental status. He does have significant past medical history for hypertension, dyslipidemia, type 2 diabetes mellitus, history of stroke, seizures anddementia. Patient was noted to have right-sided weakness, difficulty talking, andslurred speech. He had agitation and received recently combination ofSeroquel, alprazolam and Depakote. On his initial physical examination his blood pressure was 170/91, pulse rate 131, respiratory rate 27, oxygen saturation 96%. He was neurologically nonfocal, oriented only x1, his lungs were clear to auscultation bilaterally, heart S1-S2 present rhythmic, his abdomen soft, nolowerextremity edema.Sodium 158, potassium 4.9, chloride 129, bicarb 19, glucose 200, BUN 67, creatinine 1.5.Urinalysis had more than 50 white cells, 6-10 red cells, specific gravity more than 1.046.  Patient was admitted to the hospitalwith aworking diagnosis of metabolic encephalopathy complicated by hypernatremia, dehydration and acute kidney injury.   Patient developed persistent encephalopathy with featuresofdelirium, severe calorie protein malnutrition with swallow dysfunction. A PEG tube has been placed on 07.28.20, for continue  nutritional supplementation. His psychotropic and seizure medications have been adjusted.   1.  Acute metabolic encephalopathy with delirium in the setting of vascular dementia and behavioral disturbance.  Patient was admitted to the medical ward, he received supportive medical therapy, his electrolytes and dehydration was corrected.  Patient underwent further work-up with electroencephalography which was unremarkable, brain MRI which was negative for acute changes.  His psychotropic agents have been held.  Over the last 5 days patient has been stable, no significant agitation, responding to simple questions and following commands.  A PEG tube has been placed to support nutrition and hydration.  2.  History of CVA with swallow dysfunction.  Patient had persistent swallow dysfunction with high risk of aspiration.  Initially he had a nasogastric tube placed for nutrition and then transition to a PEG tube.  Continue aspiration precautions.  3.  Seizures.  No evident clinical seizure activity was noted, his electroencephalogram was negative for active seizures, patient continue Keppra and lacosamide.  4.  Type 2 diabetes mellitus.  He was placed on insulin sliding scale for glucose coverage and monitoring.  Patient will resume metformin at discharge, will hold on basal insulin to avoid hypoglycemia.  5.  Right heel deep tissue injury, not able to stage, not present on admission.  Continue local wound care.  6.  Severe protein calorie malnutrition.  Patient was placed on nutritional supplements per tube feeds.  7.  Acute kidney injury.  Prerenal renal failure, improved with supportive intravenous fluids.  8.  Obesity.  BMI 41.5.  9.  Hypertension.  Blood pressures remained well controlled with lisinopril, metoprolol, hydralazine and amlodipine.  10.  Iron deficiency anemia.  Continue iron supplements.  Discharge Diagnoses:  Principal Problem:   Acute metabolic encephalopathy Active Problems:    Goals of care, counseling/discussion   Severe protein-calorie malnutrition (HCC)   Anemia  Palliative care by specialist   Vascular dementia without behavioral disturbance ([place])   Seizure ([place])   Dysphagia   Adult failure to thrive   Type 2 diabetes mellitus without complication (HCC)   Pressure injury of skin   Aspiration into airway   PEG (percutaneous endoscopic gastrostomy) status ([place])    Discharge Instructions   Allergies as of 01/10/2019      Reactions   Hctz [hydrochlorothiazide] Swelling   Sulfa Drugs Cross Reactors Swelling   Zithromax [azithromycin] Diarrhea   Robaxin [methocarbamol] Other (See Comments)   Per MAR      Medication List    STOP taking these medications   ALPRAZolam 0.5 MG tablet Commonly known as: Xanax   aspirin EC 81 MG tablet Replaced by: aspirin 325 MG tablet   cloNIDine 0.3 mg/24hr patch Commonly known as: CATAPRES - Dosed in mg/24 hr   divalproex 250 MG DR tablet Commonly known as: DEPAKOTE   ferrous sulfate 325 (65 FE) MG tablet Replaced by: ferrous sulfate 300 (60 Fe) MG/5ML syrup   HumaLOG 100 UNIT/ML cartridge Generic drug: insulin lispro   Lantus SoloStar 100 UNIT/ML Solostar Pen Generic drug: Insulin Glargine   levETIRAcetam 1000 MG tablet Commonly known as: KEPPRA Replaced by: levETIRAcetam 100 MG/ML solution   metFORMIN 500 MG 24 hr tablet Commonly known as: GLUCOPHAGE-XR Replaced by: metFORMIN 500 MG tablet   metoprolol succinate 25 MG 24 hr tablet Commonly known as: TOPROL-XL   potassium chloride 10 MEQ tablet Commonly known as: K-DUR   QUEtiapine 50 MG tablet Commonly known as: SEROQUEL     TAKE these medications   amLODipine 10 MG tablet Commonly known as: NORVASC Place 1 tablet (10 mg total) into feeding tube daily. Start taking on: January 11, 2019 What changed: how to take this   aspirin 325 MG tablet Place 1 tablet (325 mg total) into feeding tube daily. Start taking on: January 11, 2019 Replaces:  aspirin EC 81 MG tablet   [employer] Nicotine 21 mg/24hr patch Generic drug: nicotine Place 21 mg onto the skin daily. Place 1 patch onto skin daily Remove old patch prior to placement of new patch   dorzolamide 2 % ophthalmic solution Commonly known as: TRUSOPT Place 1 drop into both eyes 2 (two) times daily.   feeding supplement (JEVITY 1.5 CAL/FIBER) Liqd Place 1,000 mLs into feeding tube continuous. What changed:   how much to take  how to take this  when to take this   feeding supplement (PRO-STAT SUGAR FREE 64) Liqd Place 30 mLs into feeding tube daily.   ferrous sulfate 300 (60 Fe) MG/5ML syrup Place 5 mLs (300 mg total) into feeding tube 2 (two) times daily with a meal. Replaces: ferrous sulfate 325 (65 FE) MG tablet   hydrALAZINE 100 MG tablet Commonly known as: APRESOLINE Place 1 tablet (100 mg total) into feeding tube 2 (two) times daily. What changed: how to take this   lacosamide 50 MG Tabs tablet Commonly known as: VIMPAT Place 1 tablet (50 mg total) into feeding tube 2 (two) times daily. What changed:   medication strength  how much to take  how to take this  when to take this   levETIRAcetam 100 MG/ML solution Commonly known as: KEPPRA Place 5 mLs (500 mg total) into feeding tube 2 (two) times daily. Replaces: levETIRAcetam 1000 MG tablet   lisinopril 20 MG tablet Commonly known as: ZESTRIL Place 1 tablet (20 mg total) into feeding tube daily. Start taking on: January 11, 2019  What changed: how to take this   Melatonin 3 MG Tabs Take 3 mg by mouth at bedtime as needed (sleep).   metFORMIN 500 MG tablet Commonly known as: Glucophage Take 1 tablet (500 mg total) by mouth 2 (two) times daily with a meal. Replaces: metFORMIN 500 MG 24 hr tablet   metoprolol tartrate 25 MG tablet Commonly known as: LOPRESSOR Place 1 tablet (25 mg total) into feeding tube 2 (two) times daily.   polyethylene glycol 17 g packet Commonly known as: MIRALAX /  GLYCOLAX Place 17 g into feeding tube 2 (two) times daily.   pravastatin 40 MG tablet Commonly known as: PRAVACHOL Place 1 tablet (40 mg total) into feeding tube daily. What changed: how to take this       Allergies  Allergen Reactions  . Hctz [Hydrochlorothiazide] Swelling  . Sulfa Drugs Cross Reactors Swelling  . Zithromax [Azithromycin] Diarrhea  . Robaxin [Methocarbamol] Other (See Comments)    Per MAR    Consultations:  Neurology   IR    Procedures/Studies: Ct Abdomen Wo Contrast  Result Date: 01/03/2019 CLINICAL DATA:  Evaluate anatomy prior to potential percutaneous gastrostomy tube placement EXAM: CT ABDOMEN WITHOUT CONTRAST TECHNIQUE: Multidetector CT imaging of the abdomen was performed following the standard protocol without IV contrast. COMPARISON:  CT abdomen pelvis-02/12/2016 FINDINGS: Evaluation of solid abdominal organs is degraded secondary to lack of intravenous contrast. Lower chest: Limited visualization of the lower thorax is negative for focal airspace opacity or pleural effusion. Normal heart size. Coronary artery calcifications. Trace amount of pericardial fluid, presumably physiologic. Hepatobiliary: Normal hepatic contour. Normal noncontrast appearance of the gallbladder given degree distention. No radiopaque gallstones. No ascites. Pancreas: Normal noncontrast appearance of the pancreas. Spleen: Normal noncontrast appearance of the spleen. Adrenals/Urinary Tract: Moderate amount of grossly symmetric likely age and body habitus related perinephric stranding. No evidence of urinary obstruction. No renal stones. There is mild thickening the bilateral adrenal glands without discrete nodule. The urinary bladder was not imaged. Stomach/Bowel: Enteric tube tip terminates within the duodenal bulb. There is no interposed hepatic parenchyma or transverse colon between the anterior wall of the distal aspect of the mid body of the stomach and ventral wall of the upper  abdomen (image 26, series 3) and this percutaneous window would likely be improved with gaseous distention of the stomach. Tiny hiatal hernia. Moderate colonic stool burden without evidence of enteric obstruction. Normal noncontrast appearance of the imaged portions of the appendix. No pneumoperitoneum, pneumatosis or portal venous gas. Vascular/Lymphatic: Atherosclerotic plaque within the abdominal aorta. No bulky retroperitoneal or mesenteric lymphadenopathy. Other: Diffuse body wall anasarca. Musculoskeletal: No acute or aggressive osseous abnormalities. Stigmata of DISH within the thoracic spine. IMPRESSION: 1. Gastric anatomy amenable to potential percutaneous gastrostomy tube placement as indicated. 2. Tiny hiatal hernia. 3. Coronary calcifications.  Aortic Atherosclerosis (ICD10-I70.0). Electronically Signed   By: Sandi Mariscal M.D.   On: 01/03/2019 07:41   Ct Angio Head W Or Wo Contrast  Result Date: 12/11/2018 CLINICAL DATA:  Left facial droop and left arm weakness. EXAM: CT ANGIOGRAPHY HEAD AND NECK CT PERFUSION BRAIN TECHNIQUE: Multidetector CT imaging of the head and neck was performed using the standard protocol during bolus administration of intravenous contrast. Multiplanar CT image reconstructions and MIPs were obtained to evaluate the vascular anatomy. Carotid stenosis measurements (when applicable) are obtained utilizing NASCET criteria, using the distal internal carotid diameter as the denominator. Multiphase CT imaging of the brain was performed following IV bolus contrast injection. Subsequent parametric  perfusion maps were calculated using RAPID software. CONTRAST:  158mL OMNIPAQUE IOHEXOL 350 MG/ML SOLN COMPARISON:  Brain MRI 11/07/2018.  No prior angiographic imaging. FINDINGS: CTA NECK FINDINGS Aortic arch: Standard 3 vessel aortic arch with mild-to-moderate atherosclerotic plaque. No significant arch vessel origin stenosis. Right carotid system: Patent with mild-to-moderate predominantly  calcified plaque in the distal common and proximal internal carotid arteries without evidence of significant stenosis or dissection. Left carotid system: Patent with extensive calcified plaque in the mid and distal common carotid artery resulting in 55% stenosis. Mild calcified plaque in the proximal ICA without significant stenosis. Vertebral arteries: Patent and dominant right vertebral artery with calcified plaque at its origin not resulting in significant stenosis. Diminutive left vertebral artery with severe origin stenosis and poor visualization of the remainder of the V1 segment due to venous contrast. Patent but diffusely small and irregular left V2 and V3 segments. Skeleton: Moderate cervical spondylosis. Other neck: No evidence of cervical lymphadenopathy or mass. Upper chest: Clear lung apices. Review of the MIP images confirms the above findings CTA HEAD FINDINGS Anterior circulation: The internal carotid arteries are patent from skull base to carotid termini with extensive calcified plaque resulting in mild cavernous and moderate supraclinoid stenoses bilaterally. ACAs and MCAs are patent without evidence of proximal branch occlusion or significant A1 or M1 stenosis. There are prominent branch vessel atherosclerotic changes including multiple severe M2 and A2 stenoses. No aneurysm is identified. Posterior circulation: The intracranial right vertebral artery is patent with mild atherosclerotic irregularity but no significant stenosis. The left V4 segment is occluded distal to the PICA origin, likely chronic based on the prior MRI. The basilar artery is patent with mild atherosclerotic irregularity but no flow limiting stenosis. Patent SCA is are seen bilaterally. There is a small right posterior communicating artery. Both PCAs are patent with diffuse atherosclerotic irregularity. There are mild right P1 and severe mid right P2 stenoses. No aneurysm is identified. Venous sinuses: As permitted by contrast  timing, patent. Anatomic variants: None. Review of the MIP images confirms the above findings CT Brain Perfusion Findings: ASPECTS: 10 CBF (<30%) Volume: 79mL Perfusion (Tmax>6.0s) volume: 8mL Mismatch Volume: 7mL Infarction Location:None IMPRESSION: 1. No emergent large vessel occlusion. 2. 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 3. Patent right vertebral artery without significant stenosis. 4. 55% left common carotid artery stenosis. 5. Advanced intracranial atherosclerosis including moderate bilateral ICA stenoses and multiple severe anterior and posterior circulation branch vessel stenoses. 6. Negative CT perfusion imaging. These results were communicated to Dr. Erlinda Hong at 4:00 pmon 12/11/2018 by text page via the [facility] messaging system. Electronically Signed   By: Logan Bores M.D.   On: 12/11/2018 17:58   Ct Angio Neck W Or Wo Contrast  Result Date: 12/11/2018 CLINICAL DATA:  Left facial droop and left arm weakness. EXAM: CT ANGIOGRAPHY HEAD AND NECK CT PERFUSION BRAIN TECHNIQUE: Multidetector CT imaging of the head and neck was performed using the standard protocol during bolus administration of intravenous contrast. Multiplanar CT image reconstructions and MIPs were obtained to evaluate the vascular anatomy. Carotid stenosis measurements (when applicable) are obtained utilizing NASCET criteria, using the distal internal carotid diameter as the denominator. Multiphase CT imaging of the brain was performed following IV bolus contrast injection. Subsequent parametric perfusion maps were calculated using RAPID software. CONTRAST:  160mL OMNIPAQUE IOHEXOL 350 MG/ML SOLN COMPARISON:  Brain MRI 11/07/2018.  No prior angiographic imaging. FINDINGS: CTA NECK FINDINGS Aortic arch: Standard 3 vessel aortic arch with mild-to-moderate  atherosclerotic plaque. No significant arch vessel origin stenosis. Right carotid system: Patent with mild-to-moderate predominantly calcified plaque in  the distal common and proximal internal carotid arteries without evidence of significant stenosis or dissection. Left carotid system: Patent with extensive calcified plaque in the mid and distal common carotid artery resulting in 55% stenosis. Mild calcified plaque in the proximal ICA without significant stenosis. Vertebral arteries: Patent and dominant right vertebral artery with calcified plaque at its origin not resulting in significant stenosis. Diminutive left vertebral artery with severe origin stenosis and poor visualization of the remainder of the V1 segment due to venous contrast. Patent but diffusely small and irregular left V2 and V3 segments. Skeleton: Moderate cervical spondylosis. Other neck: No evidence of cervical lymphadenopathy or mass. Upper chest: Clear lung apices. Review of the MIP images confirms the above findings CTA HEAD FINDINGS Anterior circulation: The internal carotid arteries are patent from skull base to carotid termini with extensive calcified plaque resulting in mild cavernous and moderate supraclinoid stenoses bilaterally. ACAs and MCAs are patent without evidence of proximal branch occlusion or significant A1 or M1 stenosis. There are prominent branch vessel atherosclerotic changes including multiple severe M2 and A2 stenoses. No aneurysm is identified. Posterior circulation: The intracranial right vertebral artery is patent with mild atherosclerotic irregularity but no significant stenosis. The left V4 segment is occluded distal to the PICA origin, likely chronic based on the prior MRI. The basilar artery is patent with mild atherosclerotic irregularity but no flow limiting stenosis. Patent SCA is are seen bilaterally. There is a small right posterior communicating artery. Both PCAs are patent with diffuse atherosclerotic irregularity. There are mild right P1 and severe mid right P2 stenoses. No aneurysm is identified. Venous sinuses: As permitted by contrast timing, patent.  Anatomic variants: None. Review of the MIP images confirms the above findings CT Brain Perfusion Findings: ASPECTS: 10 CBF (<30%) Volume: 38mL Perfusion (Tmax>6.0s) volume: 62mL Mismatch Volume: 70mL Infarction Location:None IMPRESSION: 1. No emergent large vessel occlusion. 2. 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 3. Patent right vertebral artery without significant stenosis. 4. 55% left common carotid artery stenosis. 5. Advanced intracranial atherosclerosis including moderate bilateral ICA stenoses and multiple severe anterior and posterior circulation branch vessel stenoses. 6. Negative CT perfusion imaging. These results were communicated to Dr. Erlinda Hong at 4:00 pmon 12/11/2018 by text page via the [facility] messaging system. Electronically Signed   By: Logan Bores M.D.   On: 12/11/2018 17:58   Mr Brain Wo Contrast  Result Date: 12/11/2018 CLINICAL DATA:  73 y/o  M; TIA, initial exam. EXAM: MRI HEAD WITHOUT CONTRAST TECHNIQUE: Axial DWI, axial T2 FLAIR, axial T2 propeller, and axial SWI sequences were acquired. Patient was unable to continue and additional sequences were not acquired. COMPARISON:  None. FINDINGS: Brain: No reduced diffusion to suggest acute or early subacute infarction. Nonspecific confluent foci of T2 FLAIR hyperintense signal abnormality are present in subcortical and periventricular white matter compatible with moderate to severe chronic microvascular ischemic changes. There is moderate to severe volume loss the brain. No focal mass effect, extra-axial collection, hydrocephalus, or herniation identified. Motion degraded SWI sequence. Vascular: Normal flow voids. Skull and upper cervical spine: Normal marrow signal. Sinuses/Orbits: Negative. Other: None. IMPRESSION: Axial DWI, T2 FLAIR, T2, and SWI sequences were acquired. Motion degraded SWI sequence. 1. No acute stroke, mass effect, or extra-axial collection identified. 2. Moderate to severe chronic  microvascular ischemic changes and volume loss of the brain. Electronically Signed   By:  Kristine Garbe M.D.   On: 12/11/2018 22:42   Nm Pulmonary Perfusion  Result Date: 12/12/2018 CLINICAL DATA:  Elevated D-dimer, mental status changes, question pulmonary embolism, history hypertension, diabetes mellitus EXAM: NUCLEAR MEDICINE PERFUSION LUNG SCAN TECHNIQUE: Perfusion images were obtained in multiple projections after intravenous injection of radiopharmaceutical. Ventilation scans intentionally deferred if perfusion scan and chest x-ray adequate for interpretation during COVID 19 epidemic. RADIOPHARMACEUTICALS:  1.5 mCi Tc-72m MAA IV COMPARISON:  None Correlation: Chest radiograph 12/11/2018 FINDINGS: Minimal diminished perfusion in LEFT lower lobe. No other perfusion abnormalities identified. Chest radiograph demonstrates LEFT basilar atelectasis, greater than suggested by perfusion scan. Findings represent a very low probability for pulmonary embolism. Ventilation exam not required. IMPRESSION: Very low probability for pulmonary embolism. Electronically Signed   By: Lavonia Dana M.D.   On: 12/12/2018 13:07   Ir Gastrostomy Tube Mod Sed  Result Date: 01/09/2019 INDICATION: 73 year old male with dysphagia EXAM: PERC PLACEMENT GASTROSTOMY MEDICATIONS: 2 g Ancef; Antibiotics were administered within 1 hour of the procedure. ANESTHESIA/SEDATION: Versed 2.0 mg IV; Fentanyl 100 mcg IV Moderate Sedation Time:  10 minutes The patient was continuously monitored during the procedure by the interventional radiology nurse under my direct supervision. CONTRAST:  10 cc-administered into the gastric lumen. FLUOROSCOPY TIME:  Fluoroscopy Time: 5 minutes 0 seconds COMPLICATIONS: None PROCEDURE: Informed written consent was obtained from the patient and the patient's family after a thorough discussion of the procedural risks, benefits and alternatives. All questions were addressed. Maximal Sterile Barrier Technique  was utilized including caps, mask, sterile gowns, sterile gloves, sterile drape, hand hygiene and skin antiseptic. A timeout was performed prior to the initiation of the procedure. The epigastrium was prepped with Betadine in a sterile fashion, and a sterile drape was applied covering the operative field. A sterile gown and sterile gloves were used for the procedure. A 5-French orogastric tube is placed under fluoroscopic guidance. Scout imaging of the abdomen confirms barium within the transverse colon. The stomach was distended with gas. Under fluoroscopic guidance, an 18 gauge needle was utilized to puncture the anterior wall of the body of the stomach. An Amplatz wire was advanced through the needle passing a T fastener into the lumen of the stomach. The T fastener was secured for gastropexy. A 9-French sheath was inserted. A snare was advanced through the 9-French sheath. A Britta Mccreedy was advanced through the orogastric tube. It was snared then pulled out the oral cavity, pulling the snare, as well. The leading edge of the gastrostomy was attached to the snare. It was then pulled down the esophagus and out the percutaneous site. Tube secured in place. Contrast was injected. Patient tolerated the procedure well and remained hemodynamically stable throughout. No complications were encountered and no significant blood loss encountered. IMPRESSION: Status post fluoroscopic placed percutaneous gastrostomy tube, with 20 [place] pull-through. Signed, Dulcy Fanny. Earleen Newport, DO [facility] Electronically Signed   By: Corrie Mckusick D.O.   On: 01/09/2019 13:51   Ct Cerebral Perfusion W Contrast  Result Date: 12/11/2018 CLINICAL DATA:  Left facial droop and left arm weakness. EXAM: CT ANGIOGRAPHY HEAD AND NECK CT PERFUSION BRAIN TECHNIQUE: Multidetector CT imaging of the head and neck was performed using the standard protocol during bolus administration of intravenous  contrast. Multiplanar CT image reconstructions and MIPs were obtained to evaluate the vascular anatomy. Carotid stenosis measurements (when applicable) are obtained utilizing NASCET criteria, using the distal internal carotid diameter as the denominator. Multiphase CT imaging of the  brain was performed following IV bolus contrast injection. Subsequent parametric perfusion maps were calculated using RAPID software. CONTRAST:  149mL OMNIPAQUE IOHEXOL 350 MG/ML SOLN COMPARISON:  Brain MRI 11/07/2018.  No prior angiographic imaging. FINDINGS: CTA NECK FINDINGS Aortic arch: Standard 3 vessel aortic arch with mild-to-moderate atherosclerotic plaque. No significant arch vessel origin stenosis. Right carotid system: Patent with mild-to-moderate predominantly calcified plaque in the distal common and proximal internal carotid arteries without evidence of significant stenosis or dissection. Left carotid system: Patent with extensive calcified plaque in the mid and distal common carotid artery resulting in 55% stenosis. Mild calcified plaque in the proximal ICA without significant stenosis. Vertebral arteries: Patent and dominant right vertebral artery with calcified plaque at its origin not resulting in significant stenosis. Diminutive left vertebral artery with severe origin stenosis and poor visualization of the remainder of the V1 segment due to venous contrast. Patent but diffusely small and irregular left V2 and V3 segments. Skeleton: Moderate cervical spondylosis. Other neck: No evidence of cervical lymphadenopathy or mass. Upper chest: Clear lung apices. Review of the MIP images confirms the above findings CTA HEAD FINDINGS Anterior circulation: The internal carotid arteries are patent from skull base to carotid termini with extensive calcified plaque resulting in mild cavernous and moderate supraclinoid stenoses bilaterally. ACAs and MCAs are patent without evidence of proximal branch occlusion or significant A1 or M1  stenosis. There are prominent branch vessel atherosclerotic changes including multiple severe M2 and A2 stenoses. No aneurysm is identified. Posterior circulation: The intracranial right vertebral artery is patent with mild atherosclerotic irregularity but no significant stenosis. The left V4 segment is occluded distal to the PICA origin, likely chronic based on the prior MRI. The basilar artery is patent with mild atherosclerotic irregularity but no flow limiting stenosis. Patent SCA is are seen bilaterally. There is a small right posterior communicating artery. Both PCAs are patent with diffuse atherosclerotic irregularity. There are mild right P1 and severe mid right P2 stenoses. No aneurysm is identified. Venous sinuses: As permitted by contrast timing, patent. Anatomic variants: None. Review of the MIP images confirms the above findings CT Brain Perfusion Findings: ASPECTS: 10 CBF (<30%) Volume: 76mL Perfusion (Tmax>6.0s) volume: 52mL Mismatch Volume: 3mL Infarction Location:None IMPRESSION: 1. No emergent large vessel occlusion. 2. 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 3. Patent right vertebral artery without significant stenosis. 4. 55% left common carotid artery stenosis. 5. Advanced intracranial atherosclerosis including moderate bilateral ICA stenoses and multiple severe anterior and posterior circulation branch vessel stenoses. 6. Negative CT perfusion imaging. These results were communicated to Dr. Erlinda Hong at 4:00 pmon 12/11/2018 by text page via the [facility] messaging system. Electronically Signed   By: Logan Bores M.D.   On: 12/11/2018 17:58   Dg Chest Port 1 View  Result Date: 12/29/2018 CLINICAL DATA:  Shortness of breath. Aspiration. EXAM: PORTABLE CHEST 1 VIEW COMPARISON:  Chest radiograph 12/12/2018 FINDINGS: Low lung volumes persist. Mild bibasilar atelectasis, similar to prior. No new airspace disease. Unchanged heart size and mediastinal contours. No pulmonary  edema, large pleural effusion or pneumothorax. Unchanged osseous structures. IMPRESSION: Persistent low lung volumes with bibasilar atelectasis. No significant change from prior exam. Electronically Signed   By: Keith Rake M.D.   On: 12/29/2018 02:52   Dg Chest Port 1 View  Result Date: 12/12/2018 CLINICAL DATA:  Respiratory distress EXAM: PORTABLE CHEST 1 VIEW COMPARISON:  December 11, 2018 FINDINGS: Again identified are low lung volumes. There is probable atelectasis at the lung  bases bilaterally. There are a few densities in the right mid lung zone favored to represent atelectasis. There may be small bilateral pleural effusions. There is no pneumothorax. The cardiac silhouette is stable from prior study. There is no acute osseous abnormality. IMPRESSION: Persistent low lung volumes with bibasilar atelectasis. No new focal airspace opacity that would be concerning for aspiration. Electronically Signed   By: Constance Holster M.D.   On: 12/12/2018 13:57   Dg Chest Port 1 View  Result Date: 12/11/2018 CLINICAL DATA:  73 year old male with weakness. EXAM: PORTABLE CHEST 1 VIEW COMPARISON:  CTA head and neck, CT perfusion today. FINDINGS: Portable AP semi upright view at 1853 hours. Somewhat low lung volumes. Normal cardiac size and mediastinal contours. Visualized tracheal air column is within normal limits. Crowding of lung markings at the bases. Otherwise Allowing for portable technique the lungs are clear. No pneumothorax. Paucity of bowel gas. No acute osseous abnormality identified. IMPRESSION: Low lung volumes with mild atelectasis. Electronically Signed   By: Genevie Ann M.D.   On: 12/11/2018 19:21   Dg Abd Portable 1v  Result Date: 12/28/2018 CLINICAL DATA:  73 year old male with enteric tube placement. EXAM: PORTABLE ABDOMEN - 1 VIEW COMPARISON:  Radiograph dated 12/25/2018 FINDINGS: There has been interval removal of previously seen feeding tube and placement of an enteric tube. The tip of the  tube is in the right upper abdomen, likely in the distal stomach. No bowel dilatation. Nonspecific bowel gas pattern. Degenerative changes of the spine. IMPRESSION: Enteric tube with tip in the distal stomach. Electronically Signed   By: Anner Crete M.D.   On: 12/28/2018 23:21   Dg Abd Portable 1v  Result Date: 12/25/2018 CLINICAL DATA:  Constipation EXAM: PORTABLE ABDOMEN - 1 VIEW COMPARISON:  11/04/2018 FINDINGS: None scattered large and small bowel gas is noted. Feeding catheter is noted within the distal stomach. No free air is seen. No definitive retained fecal material to suggest constipation is noted IMPRESSION: No acute abnormality noted. Electronically Signed   By: Inez Catalina M.D.   On: 12/25/2018 12:46   Ct Head Code Stroke Wo Contrast  Result Date: 12/11/2018 CLINICAL DATA:  Code stroke. Left facial droop and left arm weakness. EXAM: CT HEAD WITHOUT CONTRAST TECHNIQUE: Contiguous axial images were obtained from the base of the skull through the vertex without intravenous contrast. COMPARISON:  Brain MRI 11/07/2018 FINDINGS: Brain: There is no evidence of acute infarct, intracranial hemorrhage, mass, midline shift, or extra-axial fluid collection. There is mild-to-moderate cerebral atrophy. Patchy to confluent cerebral white matter hypodensities are nonspecific but compatible with moderate chronic small vessel ischemic disease. Vascular: Calcified atherosclerosis at the skull base. No hyperdense vessel. Skull: No fracture or focal osseous lesion. Sinuses/Orbits: Mild mucosal thickening in the paranasal sinuses. Clear mastoid air cells. Unremarkable orbits. Other: None. ASPECTS [facility] Stroke Program Early CT Score) - Ganglionic level infarction (caudate, lentiform nuclei, internal capsule, insula, M1-M3 cortex): 7 - Supraganglionic infarction (M4-M6 cortex): 3 Total score (0-10 with 10 being normal): 10 IMPRESSION: 1. No evidence of acute intracranial abnormality. 2. ASPECTS is 10. 3.  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 These results were called by telephone at the time of interpretation on 12/11/2018 at 3:45 p.m. to Dr. Erlinda Hong, who verbally acknowledged these results. Electronically Signed   By: Logan Bores M.D.   On: 12/11/2018 17:27   [place] Ekg Site Rite  Result Date: 12/16/2018 If Site Rite image not attached, placement could not be confirmed due to  current cardiac rhythm.     Procedures:   Subjective: Patient is tolerating tube feedings well, no nausea or vomiting, he is calm and not agitated. Denies any pain or dyspnea.   Discharge Exam: Vitals:   01/10/19 1003 01/10/19 1006  BP: (!) 170/71   Pulse:  69  Resp:    Temp:    SpO2:     Vitals:   01/10/19 0321 01/10/19 0755 01/10/19 1003 01/10/19 1006  BP: (!) 165/61 (!) 165/76 (!) 170/71   Pulse: 86 92  69  Resp: 18 15    Temp: 98.7 F (37.1 C) 98.8 F (37.1 C)    TempSrc: Oral Oral    SpO2: 100% 99%    Weight: 100.2 kg       General: deconditioned  Neurology: Awake and alert, answers to simple questions and follows simple commands.  E ENT: no pallor, no icterus, oral mucosa moist Cardiovascular: No JVD. S1-S2 present, rhythmic, no gallops, rubs, or murmurs. Trace lower extremity edema. Pulmonary: positive breath sounds bilaterally, adequate air movement, no wheezing, rhonchi or rales. Gastrointestinal. Abdomen protuberant no organomegaly, non tender, no rebound or guarding/ peg tube in place.  Skin. Right heal pressure ulcer, not able to stage.  Musculoskeletal: no joint deformities   The results of significant diagnostics from this hospitalization (including imaging, microbiology, ancillary and laboratory) are listed below for reference.     Microbiology: Recent Results (from the past 240 hour(s))  SARS Coronavirus 2 (CEPHEID - Performed in [place] hospital lab), Hosp Order     Status: None   Collection Time: 01/07/19  8:16 AM   Specimen: Nasopharyngeal Swab   Result Value Ref Range Status   SARS Coronavirus 2 NEGATIVE NEGATIVE Final    Comment: (NOTE) If result is NEGATIVE SARS-CoV-2 target nucleic acids are NOT DETECTED. The SARS-CoV-2 RNA is generally detectable in upper and lower  respiratory specimens during the acute phase of infection. The lowest  concentration of SARS-CoV-2 viral copies this assay can detect is 250  copies / mL. A negative result does not preclude SARS-CoV-2 infection  and should not be used as the sole basis for treatment or other  patient management decisions.  A negative result may occur with  improper specimen collection / handling, submission of specimen other  than nasopharyngeal swab, presence of viral mutation(s) within the  areas targeted by this assay, and inadequate number of viral copies  (<250 copies / mL). A negative result must be combined with clinical  observations, patient history, and epidemiological information. If result is POSITIVE SARS-CoV-2 target nucleic acids are DETECTED. The SARS-CoV-2 RNA is generally detectable in upper and lower  respiratory specimens dur ing the acute phase of infection.  Positive  results are indicative of active infection with SARS-CoV-2.  Clinical  correlation with patient history and other diagnostic information is  necessary to determine patient infection status.  Positive results do  not rule out bacterial infection or co-infection with other viruses. If result is PRESUMPTIVE POSTIVE SARS-CoV-2 nucleic acids MAY BE PRESENT.   A presumptive positive result was obtained on the submitted specimen  and confirmed on repeat testing.  While 2019 novel coronavirus  (SARS-CoV-2) nucleic acids may be present in the submitted sample  additional confirmatory testing may be necessary for epidemiological  and / or clinical management purposes  to differentiate between  SARS-CoV-2 and other Sarbecovirus currently known to infect humans.  If clinically indicated additional  testing with an alternate test  methodology [phone]) is advised.  The SARS-CoV-2 RNA is generally  detectable in upper and lower respiratory sp ecimens during the acute  phase of infection. The expected result is Negative. Fact Sheet for Patients:  StrictlyIdeas.no Fact Sheet for Healthcare Providers: BankingDealers.co.za This test is not yet approved or cleared by the [place] FDA and has been authorized for detection and/or diagnosis of SARS-CoV-2 by FDA under an Emergency Use Authorization (EUA).  This EUA will remain in effect (meaning this test can be used) for the duration of the COVID-19 declaration under Section 564(b)(1) of the Act, 21 U.S.C. section 360bbb-3(b)(1), unless the authorization is terminated or revoked sooner. Performed at [facility], [place] [address] [place], [place] [address]      Labs: BNP (last 3 results) No results for input(s): BNP in the last 8760 hours. Basic Metabolic Panel: Recent Labs  Lab 01/07/19 0525 01/08/19 0351  NA 138  --   K 3.8  --   CL 105  --   CO2 25  --   GLUCOSE 127*  --   BUN 11  --   CREATININE 0.56* 0.68  CALCIUM 8.8*  --    Liver Function Tests: No results for input(s): AST, ALT, ALKPHOS, BILITOT, PROT, ALBUMIN in the last 168 hours. No results for input(s): LIPASE, AMYLASE in the last 168 hours. No results for input(s): AMMONIA in the last 168 hours. CBC: Recent Labs  Lab 01/04/19 0753 01/07/19 0525  WBC 12.0* 12.3*  HGB 7.8* 8.0*  HCT 26.6* 26.8*  MCV 79.4* 78.1*  PLT 309 293   Cardiac Enzymes: No results for input(s): CKTOTAL, CKMB, CKMBINDEX, TROPONINI in the last 168 hours. BNP: Invalid input(s): POCBNP CBG: Recent Labs  Lab 01/09/19 1623 01/09/19 1956 01/09/19 2350 01/10/19 0318 01/10/19 0751  GLUCAP 115* 97 97 94 133*   D-Dimer No results for input(s): DDIMER in the last 72 hours. Hgb A1c No results for input(s): HGBA1C in the last  72 hours. Lipid Profile No results for input(s): CHOL, HDL, LDLCALC, TRIG, CHOLHDL, LDLDIRECT in the last 72 hours. Thyroid function studies No results for input(s): TSH, T4TOTAL, T3FREE, THYROIDAB in the last 72 hours.  Invalid input(s): FREET3 Anemia work up No results for input(s): VITAMINB12, FOLATE, FERRITIN, TIBC, IRON, RETICCTPCT in the last 72 hours. Urinalysis    Component Value Date/Time   COLORURINE AMBER (A) 12/12/2018 0108   APPEARANCEUR HAZY (A) 12/12/2018 0108   LABSPEC >1.046 (H) 12/12/2018 0108   PHURINE 5.0 12/12/2018 0108   GLUCOSEU NEGATIVE 12/12/2018 0108   HGBUR SMALL (A) 12/12/2018 0108   BILIRUBINUR NEGATIVE 12/12/2018 0108   KETONESUR NEGATIVE 12/12/2018 0108   PROTEINUR 30 (A) 12/12/2018 0108   NITRITE NEGATIVE 12/12/2018 0108   LEUKOCYTESUR LARGE (A) 12/12/2018 0108   Sepsis Labs Invalid input(s): PROCALCITONIN,  WBC,  LACTICIDVEN Microbiology Recent Results (from the past 240 hour(s))  SARS Coronavirus 2 (CEPHEID - Performed in [place] hospital lab), Hosp Order     Status: None   Collection Time: 01/07/19  8:16 AM   Specimen: Nasopharyngeal Swab  Result Value Ref Range Status   SARS Coronavirus 2 NEGATIVE NEGATIVE Final    Comment: (NOTE) If result is NEGATIVE SARS-CoV-2 target nucleic acids are NOT DETECTED. The SARS-CoV-2 RNA is generally detectable in upper and lower  respiratory specimens during the acute phase of infection. The lowest  concentration of SARS-CoV-2 viral copies this assay can detect is 250  copies / mL. A negative result does not preclude SARS-CoV-2 infection  and should  not be used as the sole basis for treatment or other  patient management decisions.  A negative result may occur with  improper specimen collection / handling, submission of specimen other  than nasopharyngeal swab, presence of viral mutation(s) within the  areas targeted by this assay, and inadequate number of viral copies  (<250 copies / mL). A  negative result must be combined with clinical  observations, patient history, and epidemiological information. If result is POSITIVE SARS-CoV-2 target nucleic acids are DETECTED. The SARS-CoV-2 RNA is generally detectable in upper and lower  respiratory specimens dur ing the acute phase of infection.  Positive  results are indicative of active infection with SARS-CoV-2.  Clinical  correlation with patient history and other diagnostic information is  necessary to determine patient infection status.  Positive results do  not rule out bacterial infection or co-infection with other viruses. If result is PRESUMPTIVE POSTIVE SARS-CoV-2 nucleic acids MAY BE PRESENT.   A presumptive positive result was obtained on the submitted specimen  and confirmed on repeat testing.  While 2019 novel coronavirus  (SARS-CoV-2) nucleic acids may be present in the submitted sample  additional confirmatory testing may be necessary for epidemiological  and / or clinical management purposes  to differentiate between  SARS-CoV-2 and other Sarbecovirus currently known to infect humans.  If clinically indicated additional testing with an alternate test  methodology [phone]) is advised. The SARS-CoV-2 RNA is generally  detectable in upper and lower respiratory sp ecimens during the acute  phase of infection. The expected result is Negative. Fact Sheet for Patients:  StrictlyIdeas.no Fact Sheet for Healthcare Providers: BankingDealers.co.za This test is not yet approved or cleared by the [place] FDA and has been authorized for detection and/or diagnosis of SARS-CoV-2 by FDA under an Emergency Use Authorization (EUA).  This EUA will remain in effect (meaning this test can be used) for the duration of the COVID-19 declaration under Section 564(b)(1) of the Act, 21 U.S.C. section 360bbb-3(b)(1), unless the authorization is terminated or revoked sooner. Performed  at [facility], [place] [address], [place], [place] [address]      Time coordinating discharge: 45 minutes  SIGNED:   Tawni Millers, MD  [facility] 01/10/2019, 11:26 AM

## 2019-01-10 NOTE — Progress Notes (Signed)
Referring Physician(s): Dr.Adhikari  Supervising Physician: Jacqulynn Cadet  Patient Status:  [facility] - In-pt  Chief Complaint: Dysphagia  Subjective: Sleeping.  In restraints.  Arouses when G-tube manipulated.  Allergies: Hctz [hydrochlorothiazide], Sulfa drugs cross reactors, Zithromax [azithromycin], and Robaxin [methocarbamol]  Medications: Prior to Admission medications   Medication Sig Start Date End Date Taking? Authorizing Provider  ALPRAZolam Duanne Moron) 0.5 MG tablet Take 1 tablet (0.5 mg total) by mouth 2 (two) times daily. 11/26/18  Yes Hennie Duos, MD  amLODipine (NORVASC) 10 MG tablet Take 10 mg by mouth daily.   Yes [provider]  aspirin EC 81 MG tablet Take 81 mg by mouth daily.   Yes [provider]  cloNIDine (CATAPRES - DOSED IN MG/24 HR) 0.3 mg/24hr patch Place 1 patch onto the skin once a week last placed on 11/18/2018   Yes [provider]  divalproex (DEPAKOTE) 250 MG DR tablet Take 250 mg by mouth 2 (two) times daily.    Yes [provider]  dorzolamide (TRUSOPT) 2 % ophthalmic solution Place 1 drop into both eyes 2 (two) times daily.   Yes [provider]  ferrous sulfate 325 (65 FE) MG tablet Take 325 mg by mouth daily with breakfast.   Yes [provider]  hydrALAZINE (APRESOLINE) 100 MG tablet Take 100 mg by mouth 2 (two) times daily.   Yes [provider]  Insulin Glargine (LANTUS SOLOSTAR) 100 UNIT/ML Solostar Pen Inject 30 Units into the skin at bedtime.    Yes [provider]  insulin lispro (HUMALOG) 100 UNIT/ML cartridge Inject 5 units into skin three times a day with meals   Yes [provider]  Lacosamide (VIMPAT) 100 MG TABS Take 100 mg by mouth 2 (two) times a day.    Yes [provider]  lisinopril (ZESTRIL) 20 MG tablet Take 20 mg by mouth daily.   Yes [provider]  metFORMIN (GLUCOPHAGE-XR) 500 MG 24 hr tablet Take 500 mg by mouth daily  with breakfast.   Yes [provider]  metoprolol succinate (TOPROL-XL) 25 MG 24 hr tablet Take 25 mg by mouth daily. For HTN   Yes [provider]  nicotine ([employer] NICOTINE) 21 mg/24hr patch Place 21 mg onto the skin daily. Place 1 patch onto skin daily Remove old patch prior to placement of new patch   Yes [provider]  potassium chloride (K-DUR) 10 MEQ tablet Take 10 mEq by mouth daily.   Yes [provider]  pravastatin (PRAVACHOL) 40 MG tablet Take 40 mg by mouth daily.   Yes [provider]  QUEtiapine (SEROQUEL) 50 MG tablet Take 50 mg by mouth 2 (two) times daily.    Yes [provider]  levETIRAcetam (KEPPRA) 1000 MG tablet Take 1,000 mg by mouth 2 (two) times daily.    [provider]  Melatonin 3 MG TABS Take 3 mg by mouth at bedtime as needed (sleep).     [provider]  Nutritional Supplement LIQD Take 120 mLs by mouth 2 (two) times a day.    [provider]     Vital Signs: BP (!) 170/71   Pulse 69   Temp 98.8 F (37.1 C) (Oral)   Resp 15   Wt 221 lb (100.2 kg)   SpO2 99%   BMI 33.60 kg/m   Physical Exam  NAD, in restraints Abdomen: soft, non-distended.  Gastrostomy tube in place. No bleeding.  Site c/d/i. Currently in use without  issue.   Imaging: Ir Gastrostomy Tube Mod Sed  Result Date: 01/09/2019 INDICATION: 73 year old male with dysphagia EXAM: PERC PLACEMENT GASTROSTOMY MEDICATIONS: 2 g Ancef; Antibiotics were administered within 1 hour of the procedure. ANESTHESIA/SEDATION: Versed 2.0 mg IV; Fentanyl 100 mcg IV Moderate Sedation Time:  10 minutes The patient was continuously monitored during the procedure by the interventional radiology nurse under my direct supervision. CONTRAST:  10 cc-administered into the gastric lumen. FLUOROSCOPY TIME:  Fluoroscopy Time: 5 minutes 0 seconds COMPLICATIONS: None PROCEDURE: Informed written consent was obtained from the patient and the patient's  family after a thorough discussion of the procedural risks, benefits and alternatives. All questions were addressed. Maximal Sterile Barrier Technique was utilized including caps, mask, sterile gowns, sterile gloves, sterile drape, hand hygiene and skin antiseptic. A timeout was performed prior to the initiation of the procedure. The epigastrium was prepped with Betadine in a sterile fashion, and a sterile drape was applied covering the operative field. A sterile gown and sterile gloves were used for the procedure. A 5-French orogastric tube is placed under fluoroscopic guidance. Scout imaging of the abdomen confirms barium within the transverse colon. The stomach was distended with gas. Under fluoroscopic guidance, an 18 gauge needle was utilized to puncture the anterior wall of the body of the stomach. An Amplatz wire was advanced through the needle passing a T fastener into the lumen of the stomach. The T fastener was secured for gastropexy. A 9-French sheath was inserted. A snare was advanced through the 9-French sheath. A Britta Mccreedy was advanced through the orogastric tube. It was snared then pulled out the oral cavity, pulling the snare, as well. The leading edge of the gastrostomy was attached to the snare. It was then pulled down the esophagus and out the percutaneous site. Tube secured in place. Contrast was injected. Patient tolerated the procedure well and remained hemodynamically stable throughout. No complications were encountered and no significant blood loss encountered. IMPRESSION: Status post fluoroscopic placed percutaneous gastrostomy tube, with 20 [place] pull-through. Signed, Dulcy Fanny. Earleen Newport, DO [facility] Radiology Electronically Signed   By: Corrie Mckusick D.O.   On: 01/09/2019 13:51    Labs:  CBC: Recent Labs    12/31/18 0432 01/03/19 0346 01/04/19 0753 01/07/19 0525  WBC 10.8* 14.0* 12.0* 12.3*  HGB 8.2* 7.9* 7.8* 8.0*  HCT 28.3*  27.7* 26.6* 26.8*  PLT 426* 349 309 293    COAGS: Recent Labs    12/11/18 1600 01/07/19 0525  INR 1.4* 1.2  APTT 30  --     BMP: Recent Labs    12/29/18 0320 12/30/18 1102 01/01/19 0416 01/03/19 0346 01/07/19 0525 01/08/19 0351  NA 137 135  --  141 138  --   K 3.8 4.9  --  3.6 3.8  --   CL 106 108  --  108 105  --   CO2 23 18*  --  23 25  --   GLUCOSE 124* 127*  --  167* 127*  --   BUN 13 11  --  14 11  --   CALCIUM 9.1 8.7*  --  8.9 8.8*  --   CREATININE 0.71 0.69 0.93 0.58* 0.56* 0.68  GFRNONAA >60 >60 >60 >60 >60 >60  GFRAA >60 >60 >60 >60 >60 >60    LIVER FUNCTION TESTS: Recent Labs    12/12/18 0500 12/14/18 0416 12/17/18 0459 12/19/18 0328  BILITOT 0.9 0.5 0.9 0.5  AST 31 21 41 39  ALT  22 18 24 31   ALKPHOS 62 59 63 57  PROT 8.3* 7.9 6.7 6.3*  ALBUMIN 2.4* 2.3* 1.9* 1.8*    Assessment and Plan: Dysphagia, long-term care Patient s/p gastrostomy tube placement in IR 7/29.  Tube intact today. No issues noted. Currently in use.  IR following.   Electronically Signed: Docia Barrier, PA 01/10/2019, 10:56 AM   I spent a total of 15 Minutes at the the patient's bedside AND on the patient's hospital floor or unit, greater than 50% of which was counseling/coordinating care for dysphagia.

## 2019-01-10 NOTE — TOC Progression Note (Signed)
Transition of Care [facility]) - Progression Note    Patient Details  Name: Julian Tyler MRN: [id number] Date of Birth: December 26, 1945  Transition of Care [facility]) CM/SW [place], [place] Phone Number: 01/10/2019, 8:25 PM  Clinical Narrative:   CSW attempted to contact patient's son to discuss bed offer at [facility]. No answer, unable to leave a message. Family has not yet chosen a SNF. Potential barrier to placement is also that PT and OT have signed off, that patient has made no progress and no longer requires skilled therapy, which is what would qualify patient for a rehab admission. CSW to follow.    Expected Discharge Plan: [place] Barriers to Discharge: Continued Medical Work up, Ship broker  Expected Discharge Plan and Services Expected Discharge Plan: [place] Choice: [place] Living arrangements for the past 2 months: [place] Expected Discharge Date: 01/10/19                                     Social Determinants of Health (SDOH) Interventions    Readmission Risk Interventions No flowsheet data found.

## 2019-01-10 NOTE — TOC Progression Note (Signed)
Transition of Care [facility]) - Progression Note    Patient Details  Name: Reis Goga MRN: [id number] Date of Birth: [birth year]  Transition of Care [facility]) CM/SW [place], [place] Phone Number: 01/10/2019, 8:21 PM  Clinical Narrative:   CSW following for discharge plans. Noting that peg placement has been delayed, patient will need peg prior to transition to SNF. No bed offers at this time.     Expected Discharge Plan: [place] Barriers to Discharge: Continued Medical Work up, Ship broker  Expected Discharge Plan and Services Expected Discharge Plan: [place] Choice: [place] Living arrangements for the past 2 months: [place] Expected Discharge Date: 01/10/19                                     Social Determinants of Health (SDOH) Interventions    Readmission Risk Interventions No flowsheet data found.

## 2019-01-10 NOTE — TOC Initial Note (Signed)
LATE NOTE SUBMISSION    Transition of Care [facility]) - Initial/Assessment Note    Patient Details  Name: Julian Tyler MRN: [id number] Date of Birth: 1945/07/08  Transition of Care [facility]) CM/SW Contact:    Geralynn Ochs, LCSW Phone Number: 01/10/2019, 8:19 PM  Clinical Narrative:   CSW spoke with patient's son, Broadus John, to discuss SNF placement. Broadus John in agreement, hopeful that the patient can be somewhere near [facility], as that is where the patient's wife is located. CSW reminded Broadus John that no one will be allowed to visit, at least for right now, and he indicated understanding. Broadus John asked about what happens when the patient has to transition to long term care, and CSW explained that there will be social workers at the SNF who can walk him through if he would qualify for Medicaid. Broadus John appreciative of CSW information. CSW to fax out referral and follow.              Expected Discharge Plan: Skilled Nursing Facility Barriers to Discharge: Continued Medical Work up, Ship broker   Patient Goals and CMS Choice Patient states their goals for this hospitalization and ongoing recovery are:: patient unable to participate in goal setting CMS Medicare.gov Compare Post Acute Care list provided to:: Patient Represenative (must comment) Choice offered to / list presented to : Adult Children  Expected Discharge Plan and Services Expected Discharge Plan: [place] Acute Care Choice: [place] Living arrangements for the past 2 months: [place] Expected Discharge Date: 01/10/19                                    Prior Living Arrangements/Services Living arrangements for the past 2 months: [place]   Patient language and need for interpreter reviewed:: No Do you feel safe going back to the place where you live?: Yes      Need for Family Participation in Patient Care: Yes (Comment) Care giver  support system in place?: No (comment)   Criminal Activity/Legal Involvement Pertinent to Current Situation/Hospitalization: No - Comment as needed  Activities of Daily Living      Permission Sought/Granted Permission sought to share information with : Facility Sport and exercise psychologist, Family Supports Permission granted to share information with : Yes, Verbal Permission Granted  Share Information with NAME: Broadus John  Permission granted to share info w AGENCY: SNF  Permission granted to share info w Relationship: Son     Emotional Assessment   Attitude/Demeanor/Rapport: Unable to Assess Affect (typically observed): Unable to Assess Orientation: : Oriented to Self Alcohol / Substance Use: Not Applicable Psych Involvement: No (comment)  Admission diagnosis:  Tachycardia [R00.0] Altered mental status, unspecified altered mental status type [R41.82] Patient Active Problem List   Diagnosis Date Noted  . PEG (percutaneous endoscopic gastrostomy) status ([place])   . Aspiration into airway   . Pressure injury of skin 12/29/2018  . Type 2 diabetes mellitus without complication ([place]) 93/71/6967  . Dysphagia   . Adult failure to thrive   . Acute metabolic encephalopathy 89/38/1017  . Seizure ([place]) 12/19/2018  . 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 Vascular dementia without behavioral disturbance ([place])   . Palliative care by specialist   . Severe protein-calorie malnutrition ([place]) 12/11/2018  . Anemia 12/11/2018  . Goals of care, counseling/discussion 12/01/2018  . Dysarthria 11/25/2018  . Expressive aphasia 11/25/2018  .  Partial seizures ([place]) 11/25/2018  . Delirium 11/25/2018  . Dementia with behavioral problem ([place]) 11/25/2018  . Hypertension associated with diabetes ([place]) 11/25/2018  . Acute kidney injury ([place]) 11/25/2018  . Hyperlipidemia associated with type 2 diabetes mellitus ([place]) 11/25/2018  . Iron deficiency anemia 11/25/2018  . Tobacco abuse  11/25/2018   PCP:  Puschinsky, Fransico Him., MD Pharmacy:   [place] [address] Phone: [phone] Fax: [phone]     Social Determinants of Health (SDOH) Interventions    Readmission Risk Interventions No flowsheet data found.

## 2019-01-11 ENCOUNTER — Encounter (HOSPITAL_COMMUNITY): Payer: Self-pay | Admitting: *Deleted

## 2019-01-11 LAB — GLUCOSE, CAPILLARY
Glucose-Capillary: 137 mg/dL — ABNORMAL HIGH (ref 70–99)
Glucose-Capillary: 162 mg/dL — ABNORMAL HIGH (ref 70–99)
Glucose-Capillary: 166 mg/dL — ABNORMAL HIGH (ref 70–99)
Glucose-Capillary: 168 mg/dL — ABNORMAL HIGH (ref 70–99)
Glucose-Capillary: 180 mg/dL — ABNORMAL HIGH (ref 70–99)
Glucose-Capillary: 195 mg/dL — ABNORMAL HIGH (ref 70–99)
Glucose-Capillary: 207 mg/dL — ABNORMAL HIGH (ref 70–99)

## 2019-01-11 MED ORDER — TRAZODONE HCL 50 MG PO TABS
50.0000 mg | ORAL_TABLET | Freq: Once | ORAL | Status: AC
  Administered 2019-01-11: 50 mg via ORAL
  Filled 2019-01-11: qty 1

## 2019-01-11 MED ORDER — TRAZODONE HCL 50 MG PO TABS
50.0000 mg | ORAL_TABLET | Freq: Every day | ORAL | Status: DC
  Administered 2019-01-11: 50 mg
  Filled 2019-01-11: qty 1

## 2019-01-11 NOTE — TOC Progression Note (Addendum)
Transition of Care [facility]) - Progression Note    Patient Details  Name: Julian Tyler MRN: [id number] Date of Birth: May 01, 1946  Transition of Care [facility]) CM/SW [place], [place] Phone Number: 01/11/2019, 12:17 PM  Clinical Narrative:     Update: CSW called the patient's daughter in law, Karma Lew and she was present with her husband. CSW gave bed offers, he asked if he could have the weekend to think about it. CSW stated she would need to know no later than Sunday afternoon so that insurance authorization could be started on Sunday.   CSW will continue to follow.   Domenic Schwab, MSW, LCSW-A Clinical Social Worker [facility]  [phone]   CSW attempted to contact the patient's son with bed offers. CSW left a voicemail. CSW is awaiting a return phone call.   Expected Discharge Plan: [place] Barriers to Discharge: Continued Medical Work up, Ship broker  Expected Discharge Plan and Services Expected Discharge Plan: [place] Choice: [place] Living arrangements for the past 2 months: [place] Expected Discharge Date: 01/10/19                                     Social Determinants of Health (SDOH) Interventions    Readmission Risk Interventions No flowsheet data found.

## 2019-01-11 NOTE — Plan of Care (Signed)
  Problem: Activity: Goal: Risk for activity intolerance will decrease Outcome: Progressing   Problem: Coping: Goal: Level of anxiety will decrease Outcome: Progressing   Problem: Pain Managment: Goal: General experience of comfort will improve Outcome: Progressing   Problem: Education: Goal: Knowledge of disease or condition will improve Outcome: Progressing Goal: Knowledge of secondary prevention will improve Outcome: Progressing Goal: Knowledge of patient specific risk factors addressed and post discharge goals established will improve Outcome: Progressing   Problem: Nutrition: Goal: Risk of aspiration will decrease Outcome: Progressing Goal: Dietary intake will improve Outcome: Progressing   Problem: Nutrition Goal: Nutritional status is improving Description: Monitor and assess patient for malnutrition (ex- brittle hair, bruises, dry skin, pale skin and conjunctiva, muscle wasting, smooth red tongue, and disorientation). Collaborate with interdisciplinary team and initiate plan and interventions as ordered.  Monitor patient's weight and dietary intake as ordered or per policy. Utilize nutrition screening tool and intervene per policy. Determine patient's food preferences and provide high-protein, high-caloric foods as appropriate.  Outcome: Progressing   Ival Bible, BSN, RN

## 2019-01-11 NOTE — Plan of Care (Signed)
  Problem: Nutrition: Goal: Risk of aspiration will decrease Outcome: Progressing   Problem: Nutrition Goal: Nutritional status is improving Description: Monitor and assess patient for malnutrition (ex- brittle hair, bruises, dry skin, pale skin and conjunctiva, muscle wasting, smooth red tongue, and disorientation). Collaborate with interdisciplinary team and initiate plan and interventions as ordered.  Monitor patient's weight and dietary intake as ordered or per policy. Utilize nutrition screening tool and intervene per policy. Determine patient's food preferences and provide high-protein, high-caloric foods as appropriate.  Outcome: Progressing

## 2019-01-11 NOTE — Progress Notes (Signed)
PROGRESS NOTE    Ronold Hardgrove  ASN:[id number] DOB: Jul 16, 1945 DOA: 12/11/2018 PCP: Milana Na., MD    Brief Narrative:  73 year old male who presented with altered mental status. He does have significant past medical history for hypertension, dyslipidemia, type 2 diabetes mellitus, history of stroke, seizures anddementia. Patient was noted to have right-sided weakness, difficulty talking, andslurred speech. He had agitation and received recently combination ofSeroquel, alprazolam and Depakote. On his initial physical examination his blood pressure was 170/91, pulse rate 131, respiratory rate 27, oxygen saturation 96%. He was neurologically nonfocal, oriented only x1, his lungs were clear to auscultation bilaterally, heart S1-S2 present rhythmic, his abdomen soft, nolowerextremity edema.Sodium 158, potassium 4.9, chloride 129, bicarb 19, glucose 200, BUN 67, creatinine 1.5.Urinalysis had more than 50 white cells, 6-10 red cells, specific gravity more than 1.046.  Patient was admitted to the hospitalwith aworking diagnosis of metabolic encephalopathy complicated by hypernatremia, dehydration and acute kidney injury.   Patient developed persistent encephalopathy with featuresofdelirium, severe calorie protein malnutrition with swallow dysfunction. A PEG tube has been placed on 07.28.20, for continue nutritional supplementation. His psychotropic and seizure medications have been adjusted.    Assessment & Plan:   Principal Problem:   Acute metabolic encephalopathy Active Problems:   Goals of care, counseling/discussion   Severe protein-calorie malnutrition (HCC)   Anemia   Palliative care by specialist   Vascular dementia without behavioral disturbance ([place])   Seizure ([place])   Dysphagia   Adult failure to thrive   Type 2 diabetes mellitus without complication (HCC)   Pressure injury of skin   Aspiration into airway   PEG (percutaneous endoscopic gastrostomy)  status ([place])   1. Acute metabolic encephalopathy with delirium, in the setting of vascular dementia and behavioral disturbance. patient has been awake during the night and tends to be more somnolent during the day. Today he requires right hand physical restrain to protect peg tube. Will add trazodone tonight and will continue neuro checks. Avoid benzodiazepines.   Continue nutrition per tube feedings.  2. Hx of CVA with swallow dysfunction.Patient has been started on tube feedings with good toleration. Continue aspiration precautions.   3. Seizures.Onkeppra and lacosemide, with no clinical active seizures.   4. T2DM.Continue well controlled glucose, 150, 195, 162, 168, 207. Insulin sliding scale.   5. Right heal deep tissue injury, not able to stage. Continue with local wound care. Patient has remained not ambulatory.  6. Severe protein malnutrition. Nutritional supplements per peg tube with good toleration.  7.AKI.Clinically resolved.   8. Obesity. BMI33.5.  9. HTN.Blood pressure has been elevated to 193 and 790 systolic because no enteral port. Today will get peg tube and will continue withlisinopril, metoprolol, hydralazine and amlodipine.   DVT prophylaxis:enoxaparin Code Status:full Family Communication:no family at the bedside Disposition Plan/ discharge barriers:pending placement, return to SNF.   Body mass index is 33.59 kg/m. Malnutrition Type:  Nutrition Problem: Inadequate oral intake Etiology: inability to eat   Malnutrition Characteristics:  Signs/Symptoms: NPO status   Nutrition Interventions:  Interventions: Refer to RD note for recommendations  RN Pressure Injury Documentation: Pressure Injury 12/26/18 Heel Right Deep Tissue Injury - Purple or maroon localized area of discolored intact skin or blood-filled blister due to damage of underlying soft tissue from pressure and/or shear. right heel deep tissue injury (Active)   12/26/18 1400  Location: Heel  Location Orientation: Right  Staging: Deep Tissue Injury - Purple or maroon localized area of discolored intact skin or blood-filled blister due to damage  of underlying soft tissue from pressure and/or shear.  Wound Description (Comments): right heel deep tissue injury  Present on Admission:      Pressure Injury 01/10/19 Buttocks Left Stage II -  Partial thickness loss of dermis presenting as a shallow open ulcer with a red, pink wound bed without slough. (Active)  01/10/19 1630  Location: Buttocks  Location Orientation: Left  Staging: Stage II -  Partial thickness loss of dermis presenting as a shallow open ulcer with a red, pink wound bed without slough.  Wound Description (Comments):   Present on Admission:      Consultants:   Neurology   IR  Procedures:     Antimicrobials:       Subjective: Patient had been agitated last night, trying to pull his peg tube, and required right hand restrain to protect tube. This am is more calm, responding to yes and no questions.   Objective: Vitals:   01/10/19 2323 01/11/19 0341 01/11/19 0436 01/11/19 0901  BP: (!) 153/72 (!) 160/75  137/64  Pulse: 84 90  91  Resp: 18 17  19   Temp: 98.9 F (37.2 C) 98.7 F (37.1 C)  99 F (37.2 C)  TempSrc: Oral Oral  Oral  SpO2: 100% 100%  99%  Weight:   100.2 kg     Intake/Output Summary (Last 24 hours) at 01/11/2019 1017 Last data filed at 01/11/2019 0217 Gross per 24 hour  Intake 746 ml  Output -  Net 746 ml   Filed Weights   01/09/19 0334 01/10/19 0321 01/11/19 0436  Weight: 119.7 kg 100.2 kg 100.2 kg    Examination:   General: deconditioned  Neurology: Awake and alert, right hand restrain. Answers to yes and no questions.   E ENT: no pallor, no icterus, oral mucosa moist Cardiovascular: No JVD. S1-S2 present, rhythmic, no gallops, rubs, or murmurs. No lower extremity edema. Pulmonary: positive breath sounds bilaterally, adequate air movement,  no wheezing, rhonchi or rales. Gastrointestinal. Abdomen with, no organomegaly, non tender, no rebound or guarding. Positive peg tube in place.  Skin. No rashes Musculoskeletal: no joint deformities     Data Reviewed: I have personally reviewed following labs and imaging studies  CBC: Recent Labs  Lab 01/07/19 0525  WBC 12.3*  HGB 8.0*  HCT 26.8*  MCV 78.1*  PLT 097   Basic Metabolic Panel: Recent Labs  Lab 01/07/19 0525 01/08/19 0351  NA 138  --   K 3.8  --   CL 105  --   CO2 25  --   GLUCOSE 127*  --   BUN 11  --   CREATININE 0.56* 0.68  CALCIUM 8.8*  --    GFR: Estimated Creatinine Clearance: 95.7 mL/min (by C-G formula based on SCr of 0.68 mg/dL). Liver Function Tests: No results for input(s): AST, ALT, ALKPHOS, BILITOT, PROT, ALBUMIN in the last 168 hours. No results for input(s): LIPASE, AMYLASE in the last 168 hours. No results for input(s): AMMONIA in the last 168 hours. Coagulation Profile: Recent Labs  Lab 01/07/19 0525  INR 1.2   Cardiac Enzymes: No results for input(s): CKTOTAL, CKMB, CKMBINDEX, TROPONINI in the last 168 hours. BNP (last 3 results) No results for input(s): PROBNP in the last 8760 hours. HbA1C: No results for input(s): HGBA1C in the last 72 hours. CBG: Recent Labs  Lab 01/10/19 1613 01/10/19 2005 01/10/19 2320 01/11/19 0340 01/11/19 0745  GLUCAP 108* 157* 150* 195* 162*   Lipid Profile: No results for input(s): CHOL, HDL,  LDLCALC, TRIG, CHOLHDL, LDLDIRECT in the last 72 hours. Thyroid Function Tests: No results for input(s): TSH, T4TOTAL, FREET4, T3FREE, THYROIDAB in the last 72 hours. Anemia Panel: No results for input(s): VITAMINB12, FOLATE, FERRITIN, TIBC, IRON, RETICCTPCT in the last 72 hours.    Radiology Studies: I have reviewed all of the imaging during this hospital visit personally     Scheduled Meds: . amLODipine  10 mg Per Tube Daily  . aspirin  325 mg Per Tube Daily   Or  . aspirin  300 mg Rectal  Daily  . chlorhexidine  15 mL Mouth Rinse BID  . dorzolamide  1 drop Both Eyes BID  . feeding supplement (PRO-STAT SUGAR FREE 64)  30 mL Per Tube Daily  . ferrous sulfate  300 mg Per Tube BID WC  . hydrALAZINE  100 mg Per Tube BID  . insulin aspart  0-9 Units Subcutaneous Q4H  . lacosamide  50 mg Per Tube BID  . levETIRAcetam  500 mg Per Tube BID  . lisinopril  20 mg Per Tube Daily  . mouth rinse  15 mL Mouth Rinse q12n4p  . metoprolol tartrate  25 mg Per Tube BID  . neomycin-bacitracin-polymyxin  1 application Topical Daily  . nicotine  21 mg Transdermal Daily  . polyethylene glycol  17 g Per Tube BID  . senna  1 tablet Per Tube QHS  . sodium chloride flush  10-40 mL Intracatheter Q12H   Continuous Infusions: . feeding supplement (JEVITY 1.5 CAL/FIBER) 1,000 mL (01/11/19 0212)     LOS: 30 days        Chayil Gantt Gerome Apley, MD

## 2019-01-12 DIAGNOSIS — D631 Anemia in chronic kidney disease: Secondary | ICD-10-CM

## 2019-01-12 LAB — GLUCOSE, CAPILLARY
Glucose-Capillary: 160 mg/dL — ABNORMAL HIGH (ref 70–99)
Glucose-Capillary: 172 mg/dL — ABNORMAL HIGH (ref 70–99)
Glucose-Capillary: 177 mg/dL — ABNORMAL HIGH (ref 70–99)
Glucose-Capillary: 177 mg/dL — ABNORMAL HIGH (ref 70–99)
Glucose-Capillary: 190 mg/dL — ABNORMAL HIGH (ref 70–99)
Glucose-Capillary: 216 mg/dL — ABNORMAL HIGH (ref 70–99)

## 2019-01-12 MED ORDER — OLANZAPINE 2.5 MG PO TABS
5.0000 mg | ORAL_TABLET | Freq: Every day | ORAL | Status: DC
  Administered 2019-01-12 – 2019-01-23 (×12): 5 mg
  Filled 2019-01-12 (×12): qty 2

## 2019-01-12 NOTE — Progress Notes (Signed)
PROGRESS NOTE    Julian Tyler  GLO:[id number] DOB: April 26, 1946 DOA: 12/11/2018 PCP: Milana Na., MD    Brief Narrative:  73 year old male who presented with altered mental status. He does have significant past medical history for hypertension, dyslipidemia, type 2 diabetes mellitus, history of stroke, seizures anddementia. Patient was noted to have right-sided weakness, difficulty talking, andslurred speech. He had agitation and received recently combination ofSeroquel, alprazolam and Depakote. On his initial physical examination his blood pressure was 170/91, pulse rate 131, respiratoryrate27, oxygen saturation 96%. He was neurologically nonfocal, oriented only x1, his lungs were clear to auscultation bilaterally, heart S1-S2 present rhythmic, his abdomen soft, nolowerextremity edema.Sodium 158, potassium 4.9, chloride 129, bicarb 19, glucose 200, BUN 67, creatinine 1.5.Urinalysis had more than 50 white cells, 6-10 red cells, specific gravity more than 1.046.  Patient was admitted to the hospitalwith aworking diagnosis of metabolic encephalopathy complicated by hypernatremia, dehydration and acute kidney injury.   Patient developed persistent encephalopathy with featuresofdelirium, severe calorie protein malnutrition with swallow dysfunction.APEG tubehas been placedon 07.28.20, for continue nutritional supplementation. His psychotropic and seizure medications have been adjusted.    Assessment & Plan:   Principal Problem:   Acute metabolic encephalopathy Active Problems:   Goals of care, counseling/discussion   Severe protein-calorie malnutrition (HCC)   Anemia   Palliative care by specialist   Vascular dementia without behavioral disturbance ([place])   Seizure ([place])   Dysphagia   Adult failure to thrive   Type 2 diabetes mellitus without complication (HCC)   Pressure injury of skin   Aspiration into airway   PEG (percutaneous endoscopic gastrostomy)  status ([place])    1. Acute metabolic encephalopathy with delirium, in the setting of vascular dementia and behavioral disturbance.patient continue to have agitation at night and somnolence during the day. This am on bilateral wirst restrains. Will dc trazodone and will start on 5 mg olanzapine qhs. Patient needs to be off restrains to be transferred to SNF.Marland Kitchen   Continue nutrition per tube feedings.  2. Hx of CVA with swallow dysfunction.Tolerating tube feedings well.  3. Seizures.No active seizure activity, continue withkeppra and lacosemide.  4. T2DM.Blood glucose continue to be well controlled, continue insulin sliding scale monitoring.   5. Right heal deep tissue injury, not able to stage.Local wound care.  6. Severe protein malnutrition. Continue with nutritional supplements.   7.AKI.Clinically resolved.  8. Obesity. His calculated BMI33.5.  9. HTN.Continue with lisinopril, metoprolol, hydralazine and amlodipine for blood pressure control.   DVT prophylaxis:enoxaparin Code Status:full Family Communication:no family at the bedside Disposition Plan/ discharge barriers:pending placement, return to SNF.   Body mass index is 40.29 kg/m. Malnutrition Type:  Nutrition Problem: Inadequate oral intake Etiology: inability to eat   Malnutrition Characteristics:  Signs/Symptoms: NPO status   Nutrition Interventions:  Interventions: Refer to RD note for recommendations  RN Pressure Injury Documentation: Pressure Injury 12/26/18 Heel Right Deep Tissue Injury - Purple or maroon localized area of discolored intact skin or blood-filled blister due to damage of underlying soft tissue from pressure and/or shear. right heel deep tissue injury (Active)  12/26/18 1400  Location: Heel  Location Orientation: Right  Staging: Deep Tissue Injury - Purple or maroon localized area of discolored intact skin or blood-filled blister due to damage of underlying  soft tissue from pressure and/or shear.  Wound Description (Comments): right heel deep tissue injury  Present on Admission:      Pressure Injury 01/10/19 Buttocks Left Stage II -  Partial thickness loss of dermis presenting  as a shallow open ulcer with a red, pink wound bed without slough. (Active)  01/10/19 1630  Location: Buttocks  Location Orientation: Left  Staging: Stage II -  Partial thickness loss of dermis presenting as a shallow open ulcer with a red, pink wound bed without slough.  Wound Description (Comments):   Present on Admission:      Consultants:     Procedures:   PEG   Antimicrobials:       Subjective: Patient continue to have significant agitation at night. Not able to sleep.. Continue to be on restrains upper extremities, now with bilateral. This am is somnolent. Continue tolerating tube feedings well.   Objective: Vitals:   01/11/19 2341 01/12/19 0333 01/12/19 0500 01/12/19 0755  BP: (!) 159/89 (!) 160/98  (!) 176/76  Pulse: 97 78  91  Resp: 18 17  20   Temp: 99.3 F (37.4 C) 98 F (36.7 C)  98.5 F (36.9 C)  TempSrc: Oral Oral  Oral  SpO2: 99% 100%  100%  Weight:   120.2 kg     Intake/Output Summary (Last 24 hours) at 01/12/2019 1047 Last data filed at 01/12/2019 8676 Gross per 24 hour  Intake -  Output 500 ml  Net -500 ml   Filed Weights   01/10/19 0321 01/11/19 0436 01/12/19 0500  Weight: 100.2 kg 100.2 kg 120.2 kg    Examination:   General: deconditioned  Neurology: somnolent, opens eyes to touch and voice, not following commands, on bilateral wrist restrains.   E ENT: mild pallor, no icterus, oral mucosa moist Cardiovascular: No JVD. S1-S2 present, rhythmic, no gallops, rubs, or murmurs. No lower extremity edema. Pulmonary: positive breath sounds bilaterally, adequate air movement, no wheezing, rhonchi or rales. Gastrointestinal. Abdomen with, no organomegaly, non tender, no rebound or guarding/ peg tube in place.  Skin. No rashes  Musculoskeletal: no joint deformities     Data Reviewed: I have personally reviewed following labs and imaging studies  CBC: Recent Labs  Lab 01/07/19 0525  WBC 12.3*  HGB 8.0*  HCT 26.8*  MCV 78.1*  PLT 195   Basic Metabolic Panel: Recent Labs  Lab 01/07/19 0525 01/08/19 0351  NA 138  --   K 3.8  --   CL 105  --   CO2 25  --   GLUCOSE 127*  --   BUN 11  --   CREATININE 0.56* 0.68  CALCIUM 8.8*  --    GFR: Estimated Creatinine Clearance: 105.2 mL/min (by C-G formula based on SCr of 0.68 mg/dL). Liver Function Tests: No results for input(s): AST, ALT, ALKPHOS, BILITOT, PROT, ALBUMIN in the last 168 hours. No results for input(s): LIPASE, AMYLASE in the last 168 hours. No results for input(s): AMMONIA in the last 168 hours. Coagulation Profile: Recent Labs  Lab 01/07/19 0525  INR 1.2   Cardiac Enzymes: No results for input(s): CKTOTAL, CKMB, CKMBINDEX, TROPONINI in the last 168 hours. BNP (last 3 results) No results for input(s): PROBNP in the last 8760 hours. HbA1C: No results for input(s): HGBA1C in the last 72 hours. CBG: Recent Labs  Lab 01/11/19 1716 01/11/19 2015 01/11/19 2340 01/12/19 0332 01/12/19 0753  GLUCAP 180* 166* 137* 190* 177*   Lipid Profile: No results for input(s): CHOL, HDL, LDLCALC, TRIG, CHOLHDL, LDLDIRECT in the last 72 hours. Thyroid Function Tests: No results for input(s): TSH, T4TOTAL, FREET4, T3FREE, THYROIDAB in the last 72 hours. Anemia Panel: No results for input(s): VITAMINB12, FOLATE, FERRITIN, TIBC, IRON, RETICCTPCT in the last  72 hours.    Radiology Studies: I have reviewed all of the imaging during this hospital visit personally     Scheduled Meds: . amLODipine  10 mg Per Tube Daily  . aspirin  325 mg Per Tube Daily   Or  . aspirin  300 mg Rectal Daily  . chlorhexidine  15 mL Mouth Rinse BID  . dorzolamide  1 drop Both Eyes BID  . feeding supplement (PRO-STAT SUGAR FREE 64)  30 mL Per Tube Daily  .  ferrous sulfate  300 mg Per Tube BID WC  . hydrALAZINE  100 mg Per Tube BID  . insulin aspart  0-9 Units Subcutaneous Q4H  . lacosamide  50 mg Per Tube BID  . levETIRAcetam  500 mg Per Tube BID  . lisinopril  20 mg Per Tube Daily  . mouth rinse  15 mL Mouth Rinse q12n4p  . metoprolol tartrate  25 mg Per Tube BID  . neomycin-bacitracin-polymyxin  1 application Topical Daily  . nicotine  21 mg Transdermal Daily  . polyethylene glycol  17 g Per Tube BID  . senna  1 tablet Per Tube QHS  . traZODone  50 mg Per Tube QHS   Continuous Infusions: . feeding supplement (JEVITY 1.5 CAL/FIBER) 1,000 mL (01/11/19 2333)     LOS: 31 days         Gerome Apley, MD

## 2019-01-13 LAB — GLUCOSE, CAPILLARY
Glucose-Capillary: 170 mg/dL — ABNORMAL HIGH (ref 70–99)
Glucose-Capillary: 180 mg/dL — ABNORMAL HIGH (ref 70–99)
Glucose-Capillary: 147 mg/dL — ABNORMAL HIGH (ref 70–99)
Glucose-Capillary: 189 mg/dL — ABNORMAL HIGH (ref 70–99)

## 2019-01-13 MED ORDER — HALOPERIDOL LACTATE 5 MG/ML IJ SOLN
1.0000 mg | Freq: Four times a day (QID) | INTRAMUSCULAR | Status: DC | PRN
  Administered 2019-01-13: 1 mg via INTRAMUSCULAR
  Filled 2019-01-13: qty 1

## 2019-01-13 MED ORDER — HALOPERIDOL 1 MG PO TABS
1.0000 mg | ORAL_TABLET | Freq: Four times a day (QID) | ORAL | Status: DC | PRN
  Filled 2019-01-13: qty 1

## 2019-01-13 NOTE — Progress Notes (Signed)
CSW called both the son and daughter-in-law to see if they have picked a SNF for the patient. CSW left a voicemail. CSW is awaiting a return phone call.   Domenic Schwab, MSW, [place] Worker [facility]  [phone]

## 2019-01-13 NOTE — Progress Notes (Signed)
PROGRESS NOTE    Julian Tyler  GHW:[id number] DOB: [birth year] DOA: 12/11/2018 PCP: Milana Na., MD    Brief Narrative:  73 year old male who presented with altered mental status. He does have significant past medical history for hypertension, dyslipidemia, type 2 diabetes mellitus, history of stroke, seizures anddementia. Patient was noted to have right-sided weakness, difficulty talking, andslurred speech. He had agitation and received recently combination ofSeroquel, alprazolam and Depakote. On his initial physical examination his blood pressure was 170/91, pulse rate 131, respiratoryrate27, oxygen saturation 96%. He was neurologically nonfocal, oriented only x1, his lungs were clear to auscultation bilaterally, heart S1-S2 present rhythmic, his abdomen soft, nolowerextremity edema.Sodium 158, potassium 4.9, chloride 129, bicarb 19, glucose 200, BUN 67, creatinine 1.5.Urinalysis had more than 50 white cells, 6-10 red cells, specific gravity more than 1.046.  Patient was admitted to the hospitalwith aworking diagnosis of metabolic encephalopathy complicated by hypernatremia, dehydration and acute kidney injury.   Patient developed persistent encephalopathy with featuresofdelirium, severe calorie protein malnutrition with swallow dysfunction.APEG tubehas been placedon 07.28.20, for continue nutritional supplementation. His psychotropic and seizure medications have been adjusted.   Assessment & Plan:   Principal Problem:   Acute metabolic encephalopathy Active Problems:   Goals of care, counseling/discussion   Severe protein-calorie malnutrition (HCC)   Anemia   Palliative care by specialist   Vascular dementia without behavioral disturbance ([place])   Seizure ([place])   Dysphagia   Adult failure to thrive   Type 2 diabetes mellitus without complication (HCC)   Pressure injury of skin   Aspiration into airway   PEG (percutaneous endoscopic gastrostomy)  status ([place])    1. Acute metabolic encephalopathy with delirium, in the setting of vascular dementia and behavioral disturbance.His agitation has improved but continue to require restrains, soft wrist bilateral. Will add as needed haldol during the day and will continue with 5 mg olanzapine qhs. Patient tolerating tube feedings well, continue aspiration precautions.   2. Hx of CVA with swallow dysfunction.Continue with tube feedings.   3. Seizures.Continue withkeppra and lacosemide.  4. T2DM.Capillary glucose 172, 177, 169, 170, 180. Continue with insulin sliding scale for glucose cover and monitoring.   5. Right heal deep tissue injury, not able to stage.Continue with local wound care.  6. Severe protein malnutrition.On nutritional supplements.   7.AKI.Clinically resolved.  8. Obesity. BMI33.5.  9. HTN.Blood pressure 146/65 mmHg. Blood pressure control withlisinopril, metoprolol, hydralazine and amlodipine.    DVT prophylaxis:enoxaparin Code Status:full Family Communication:no family at the bedside Disposition Plan/ discharge barriers:pending placement, return to SNF.    Body mass index is 40.29 kg/m. Malnutrition Type:  Nutrition Problem: Inadequate oral intake Etiology: inability to eat   Malnutrition Characteristics:  Signs/Symptoms: NPO status   Nutrition Interventions:  Interventions: Refer to RD note for recommendations  RN Pressure Injury Documentation: Pressure Injury 12/26/18 Heel Right Deep Tissue Injury - Purple or maroon localized area of discolored intact skin or blood-filled blister due to damage of underlying soft tissue from pressure and/or shear. right heel deep tissue injury (Active)  12/26/18 1400  Location: Heel  Location Orientation: Right  Staging: Deep Tissue Injury - Purple or maroon localized area of discolored intact skin or blood-filled blister due to damage of underlying soft tissue from pressure and/or  shear.  Wound Description (Comments): right heel deep tissue injury  Present on Admission:      Pressure Injury 01/10/19 Buttocks Left Stage II -  Partial thickness loss of dermis presenting as a shallow open ulcer with a red,  pink wound bed without slough. (Active)  01/10/19 1630  Location: Buttocks  Location Orientation: Left  Staging: Stage II -  Partial thickness loss of dermis presenting as a shallow open ulcer with a red, pink wound bed without slough.  Wound Description (Comments):   Present on Admission:      Consultants:   IR   Procedures:   PEG   Antimicrobials:       Subjective: Patient this am somnolent, improved nocturnal agitation, he has been tolerating tube feedings well, continue on bilateral upper extremities restrains.   Objective: Vitals:   01/12/19 2023 01/12/19 2349 01/13/19 0323 01/13/19 0758  BP: (!) 149/65 (!) 157/70 (!) 159/67 (!) 150/63  Pulse: 92 89 80 79  Resp: 16 16 15 18   Temp: 99.1 F (37.3 C) 99 F (37.2 C) 99.1 F (37.3 C) 98.6 F (37 C)  TempSrc: Oral Oral Oral Oral  SpO2: 100% 99% 97%   Weight:        Intake/Output Summary (Last 24 hours) at 01/13/2019 0834 Last data filed at 01/13/2019 0456 Gross per 24 hour  Intake -  Output 103 ml  Net -103 ml   Filed Weights   01/10/19 0321 01/11/19 0436 01/12/19 0500  Weight: 100.2 kg 100.2 kg 120.2 kg    Examination:   General: deconditioned but not in pain or dyspnea.  Neurology:somnlent this am, answers to simple questions with yes and no.  E ENT: no pallor, no icterus, oral mucosa moist Cardiovascular: No JVD. S1-S2 present, rhythmic, no gallops, rubs, or murmurs. No lower extremity edema. Pulmonary: positive breath sounds bilaterally, adequate air movement, no wheezing, rhonchi or rales. Gastrointestinal. Abdomen with no organomegaly, non tender, no rebound or guarding/ peg tube in place. Skin. No rashes Musculoskeletal: no joint deformities     Data Reviewed: I have  personally reviewed following labs and imaging studies  CBC: Recent Labs  Lab 01/07/19 0525  WBC 12.3*  HGB 8.0*  HCT 26.8*  MCV 78.1*  PLT 867   Basic Metabolic Panel: Recent Labs  Lab 01/07/19 0525 01/08/19 0351  NA 138  --   K 3.8  --   CL 105  --   CO2 25  --   GLUCOSE 127*  --   BUN 11  --   CREATININE 0.56* 0.68  CALCIUM 8.8*  --    GFR: Estimated Creatinine Clearance: 105.2 mL/min (by C-G formula based on SCr of 0.68 mg/dL). Liver Function Tests: No results for input(s): AST, ALT, ALKPHOS, BILITOT, PROT, ALBUMIN in the last 168 hours. No results for input(s): LIPASE, AMYLASE in the last 168 hours. No results for input(s): AMMONIA in the last 168 hours. Coagulation Profile: Recent Labs  Lab 01/07/19 0525  INR 1.2   Cardiac Enzymes: No results for input(s): CKTOTAL, CKMB, CKMBINDEX, TROPONINI in the last 168 hours. BNP (last 3 results) No results for input(s): PROBNP in the last 8760 hours. HbA1C: No results for input(s): HGBA1C in the last 72 hours. CBG: Recent Labs  Lab 01/12/19 1118 01/12/19 1634 01/12/19 2036 01/12/19 2348 01/13/19 0435  GLUCAP 216* 172* 177* 160* 170*   Lipid Profile: No results for input(s): CHOL, HDL, LDLCALC, TRIG, CHOLHDL, LDLDIRECT in the last 72 hours. Thyroid Function Tests: No results for input(s): TSH, T4TOTAL, FREET4, T3FREE, THYROIDAB in the last 72 hours. Anemia Panel: No results for input(s): VITAMINB12, FOLATE, FERRITIN, TIBC, IRON, RETICCTPCT in the last 72 hours.    Radiology Studies: I have reviewed all of the imaging during  this hospital visit personally     Scheduled Meds: . amLODipine  10 mg Per Tube Daily  . aspirin  325 mg Per Tube Daily   Or  . aspirin  300 mg Rectal Daily  . chlorhexidine  15 mL Mouth Rinse BID  . dorzolamide  1 drop Both Eyes BID  . feeding supplement (PRO-STAT SUGAR FREE 64)  30 mL Per Tube Daily  . ferrous sulfate  300 mg Per Tube BID WC  . hydrALAZINE  100 mg Per Tube  BID  . insulin aspart  0-9 Units Subcutaneous Q4H  . lacosamide  50 mg Per Tube BID  . levETIRAcetam  500 mg Per Tube BID  . lisinopril  20 mg Per Tube Daily  . mouth rinse  15 mL Mouth Rinse q12n4p  . metoprolol tartrate  25 mg Per Tube BID  . neomycin-bacitracin-polymyxin  1 application Topical Daily  . nicotine  21 mg Transdermal Daily  . OLANZapine  5 mg Per Tube QHS  . polyethylene glycol  17 g Per Tube BID  . senna  1 tablet Per Tube QHS   Continuous Infusions: . feeding supplement (JEVITY 1.5 CAL/FIBER) 1,000 mL (01/11/19 2333)     LOS: 32 days        Arlander Gillen Gerome Apley, MD

## 2019-01-14 LAB — GLUCOSE, CAPILLARY
Glucose-Capillary: 156 mg/dL — ABNORMAL HIGH (ref 70–99)
Glucose-Capillary: 169 mg/dL — ABNORMAL HIGH (ref 70–99)
Glucose-Capillary: 175 mg/dL — ABNORMAL HIGH (ref 70–99)
Glucose-Capillary: 195 mg/dL — ABNORMAL HIGH (ref 70–99)
Glucose-Capillary: 199 mg/dL — ABNORMAL HIGH (ref 70–99)
Glucose-Capillary: 215 mg/dL — ABNORMAL HIGH (ref 70–99)
Glucose-Capillary: 252 mg/dL — ABNORMAL HIGH (ref 70–99)

## 2019-01-14 LAB — SARS CORONAVIRUS 2 BY RT PCR (HOSPITAL ORDER, PERFORMED IN ~~LOC~~ HOSPITAL LAB): SARS Coronavirus 2: NEGATIVE

## 2019-01-14 NOTE — Progress Notes (Signed)
Nutrition Follow-up   RD working remotely.  DOCUMENTATION CODES:   Obesity unspecified  INTERVENTION:  Continue Jevity 1.59frmula via PEG at goal rate of571mhr.  Continue3023mrostat (or equivalent) once daily.   Tube feeding regimen provides1900kcal (100% of needs),90grams of protein, and 912m94m H2O.  NUTRITION DIAGNOSIS:   Inadequate oral intake related to inability to eat as evidenced by NPO status; ongoing  GOAL:   Patient will meet greater than or equal to 90% of their needs; met with TF  MONITOR:   TF tolerance, Weight trends, Labs, I & O's, Skin  REASON FOR ASSESSMENT:   NPO/Clear Liquid Diet, Low [place]    ASSESSMENT:   73 y29r old male with a history of previous stroke, hypertension, diabetes, seizure disorder, dementia who is a resident of a skilled nursing facility, was brought to the hospital with worsening mental status.  Found to be significantly dehydrated, hypernatremic, with acute kidney injury.  Pt continues on NPO status. Per MD, pt continues to be confused. PEG placed 7/28. Pt has been tolerating his tube feeds. RD to continue with current tube feeding orders. SNF placement pending.    Labs and medications reviewed.   Diet Order:   Diet Order            Diet - low sodium heart healthy        Diet NPO time specified  Diet effective midnight              EDUCATION NEEDS:   Not appropriate for education at this time  Skin:  Skin Assessment: Skin Integrity Issues: Skin Integrity Issues:: Stage II, DTI DTI: R heel Stage II: L buttocks  Last BM:  7/31  Height:   Ht Readings from Last 1 Encounters:  12/11/18 '5\' 8"'  (1.727 m)    Weight:   Wt Readings from Last 1 Encounters:  01/12/19 120.2 kg    Ideal Body Weight:  70 kg  BMI:  Body mass index is 40.29 kg/m.  Estimated Nutritional Needs:   Kcal:  1900-2100  Protein:  90-110 grams  Fluid:  1.9 - 2.1 L/day   StepCorrin Parker, RD, LDN Pager #  [phone] hours/ weekend pager # [phone]

## 2019-01-14 NOTE — TOC Progression Note (Signed)
Transition of Care [facility]) - Progression Note    Patient Details  Name: [name] MRN: [id number] Date of Birth: 1945/12/08  Transition of Care [facility]) CM/SW [place], [place] Phone Number: 01/14/2019, 10:47 AM  Clinical Narrative:   CSW confirmed bed availability at [facility] for the patient. Patient will need an insurance authorization for placement; PT and OT had previously signed off. CSW asked MD to place new orders for the patient, as insurance will need updated therapy notes recommending SNF at discharge.   CSW to follow. [facility] waiting to start authorization after therapy updates have been received.     Expected Discharge Plan: [place] Barriers to Discharge: Continued Medical Work up, Ship broker  Expected Discharge Plan and Services Expected Discharge Plan: [place] Choice: [place] Living arrangements for the past 2 months: [place] Expected Discharge Date: 01/10/19                                     Social Determinants of Health (SDOH) Interventions    Readmission Risk Interventions No flowsheet data found.

## 2019-01-14 NOTE — Progress Notes (Signed)
PROGRESS NOTE    Julian Tyler  QVZ:[id number] DOB: 1945/11/30 DOA: 12/11/2018 PCP: Milana Na., MD    Brief Narrative:  73 year old male who presented with altered mental status. He does have significant past medical history for hypertension, dyslipidemia, type 2 diabetes mellitus, history of stroke, seizures anddementia. Patient was noted to have right-sided weakness, difficulty talking, andslurred speech. He had agitation and received recently combination ofSeroquel, alprazolam and Depakote. On his initial physical examination his blood pressure was 170/91, pulse rate 131, respiratoryrate27, oxygen saturation 96%. He was neurologically nonfocal, oriented only x1, his lungs were clear to auscultation bilaterally, heart S1-S2 present rhythmic, his abdomen soft, nolowerextremity edema.Sodium 158, potassium 4.9, chloride 129, bicarb 19, glucose 200, BUN 67, creatinine 1.5.Urinalysis had more than 50 white cells, 6-10 red cells, specific gravity more than 1.046.  Patient was admitted to the hospitalwith aworking diagnosis of metabolic encephalopathy complicated by hypernatremia, dehydration and acute kidney injury.   Patient developed persistent encephalopathy with featuresofdelirium, severe calorie protein malnutrition with swallow dysfunction.APEG tubehas been placedon 07.28.20, for continue nutritional supplementation. His psychotropic and seizure medications have been adjusted   Assessment & Plan:   Principal Problem:   Acute metabolic encephalopathy Active Problems:   Goals of care, counseling/discussion   Severe protein-calorie malnutrition (HCC)   Anemia   Palliative care by specialist   Vascular dementia without behavioral disturbance ([place])   Seizure ([place])   Dysphagia   Adult failure to thrive   Type 2 diabetes mellitus without complication (HCC)   Pressure injury of skin   Aspiration into airway   PEG (percutaneous endoscopic gastrostomy)  status ([place])   1. Acute metabolic encephalopathy with delirium, in the setting of vascular dementia and behavioral disturbance.Today he is more awake and alert, continue to be confused, suspected close to his baseline, will attempt today to remove physical bilateral wrist restrains. Continue with add as needed haldol and qhs  5 mg olanzapine. Continue nutritional supplementation per tube feedings.    2. Hx of CVA with swallow dysfunction.Aspiration precautions.  3. Seizures.On keppra and lacosemide with good toleration, no active seizure.   4. T2DM.On insulin sliding scale for glucose cover and monitoring. Patient is tolerating tube feedings well.  5. Right heal deep tissue injury, not able to stage.Local wound care.  6. Severe protein malnutrition.Continue with nutritional supplements.  7.AKI.Clinically resolved.  8. Obesity.Calculated BMI33.5.  9. HTN.On lisinopril, metoprolol, hydralazine and amlodipine.   DVT prophylaxis:enoxaparin Code Status:full Family Communication:no family at the bedside Disposition Plan/ discharge barriers:pending placement, return to SNF (needs to be off restrains)   Body mass index is 40.29 kg/m. Malnutrition Type:  Nutrition Problem: Inadequate oral intake Etiology: inability to eat   Malnutrition Characteristics:  Signs/Symptoms: NPO status   Nutrition Interventions:  Interventions: Refer to RD note for recommendations  RN Pressure Injury Documentation: Pressure Injury 12/26/18 Heel Right Deep Tissue Injury - Purple or maroon localized area of discolored intact skin or blood-filled blister due to damage of underlying soft tissue from pressure and/or shear. right heel deep tissue injury (Active)  12/26/18 1400  Location: Heel  Location Orientation: Right  Staging: Deep Tissue Injury - Purple or maroon localized area of discolored intact skin or blood-filled blister due to damage of underlying soft tissue  from pressure and/or shear.  Wound Description (Comments): right heel deep tissue injury  Present on Admission:      Pressure Injury 01/10/19 Buttocks Left Stage II -  Partial thickness loss of dermis presenting as a shallow open ulcer  with a red, pink wound bed without slough. (Active)  01/10/19 1630  Location: Buttocks  Location Orientation: Left  Staging: Stage II -  Partial thickness loss of dermis presenting as a shallow open ulcer with a red, pink wound bed without slough.  Wound Description (Comments):   Present on Admission:      Consultants:   Neurology   Procedures:     Antimicrobials:       Subjective: Patient this am is confused and disorientated but awake and alert, no apparent pain, dyspnea, nausea or vomiting.   Objective: Vitals:   01/13/19 2324 01/14/19 0322 01/14/19 0845 01/14/19 1226  BP: (!) 166/63 (!) 160/72 (!) 156/78 (!) 163/73  Pulse: 96 91 96 97  Resp: 16 16 18 20   Temp: 99.5 F (37.5 C) (!) 100.6 F (38.1 C) 98.9 F (37.2 C) 99.7 F (37.6 C)  TempSrc: Oral Oral Oral Axillary  SpO2: 100% 99% 99% 100%  Weight:       No intake or output data in the 24 hours ending 01/14/19 1243 Filed Weights   01/10/19 0321 01/11/19 0436 01/12/19 0500  Weight: 100.2 kg 100.2 kg 120.2 kg    Examination:   General: Not in pain or dyspnea Neurology: Awake and alert, non focal , confused and disorientated. This am on bilateral soft wrist restrains.  E ENT: no pallor, no icterus, oral mucosa moist Cardiovascular: No JVD. S1-S2 present, rhythmic, no gallops, rubs, or murmurs. No lower extremity edema. Pulmonary: vesicular breath sounds bilaterally, adequate air movement, no wheezing, rhonchi or rales. Gastrointestinal. Abdomen with no organomegaly, non tender, no rebound or guarding Skin. No rashes Musculoskeletal: no joint deformities     Data Reviewed: I have personally reviewed following labs and imaging studies  CBC: No results for input(s):  WBC, NEUTROABS, HGB, HCT, MCV, PLT in the last 168 hours. Basic Metabolic Panel: Recent Labs  Lab 01/08/19 0351  CREATININE 0.68   GFR: Estimated Creatinine Clearance: 105.2 mL/min (by C-G formula based on SCr of 0.68 mg/dL). Liver Function Tests: No results for input(s): AST, ALT, ALKPHOS, BILITOT, PROT, ALBUMIN in the last 168 hours. No results for input(s): LIPASE, AMYLASE in the last 168 hours. No results for input(s): AMMONIA in the last 168 hours. Coagulation Profile: No results for input(s): INR, PROTIME in the last 168 hours. Cardiac Enzymes: No results for input(s): CKTOTAL, CKMB, CKMBINDEX, TROPONINI in the last 168 hours. BNP (last 3 results) No results for input(s): PROBNP in the last 8760 hours. HbA1C: No results for input(s): HGBA1C in the last 72 hours. CBG: Recent Labs  Lab 01/13/19 1639 01/13/19 2017 01/14/19 0018 01/14/19 0409 01/14/19 0841  GLUCAP 147* 189* 169* 156* 175*   Lipid Profile: No results for input(s): CHOL, HDL, LDLCALC, TRIG, CHOLHDL, LDLDIRECT in the last 72 hours. Thyroid Function Tests: No results for input(s): TSH, T4TOTAL, FREET4, T3FREE, THYROIDAB in the last 72 hours. Anemia Panel: No results for input(s): VITAMINB12, FOLATE, FERRITIN, TIBC, IRON, RETICCTPCT in the last 72 hours.    Radiology Studies: I have reviewed all of the imaging during this hospital visit personally     Scheduled Meds: . amLODipine  10 mg Per Tube Daily  . aspirin  325 mg Per Tube Daily  . chlorhexidine  15 mL Mouth Rinse BID  . dorzolamide  1 drop Both Eyes BID  . feeding supplement (PRO-STAT SUGAR FREE 64)  30 mL Per Tube Daily  . ferrous sulfate  300 mg Per Tube BID WC  . hydrALAZINE  100 mg Per Tube BID  . insulin aspart  0-9 Units Subcutaneous Q4H  . lacosamide  50 mg Per Tube BID  . levETIRAcetam  500 mg Per Tube BID  . lisinopril  20 mg Per Tube Daily  . mouth rinse  15 mL Mouth Rinse q12n4p  . metoprolol tartrate  25 mg Per Tube BID  .  neomycin-bacitracin-polymyxin  1 application Topical Daily  . nicotine  21 mg Transdermal Daily  . OLANZapine  5 mg Per Tube QHS  . polyethylene glycol  17 g Per Tube BID  . senna  1 tablet Per Tube QHS   Continuous Infusions: . feeding supplement (JEVITY 1.5 CAL/FIBER) 1,000 mL (01/11/19 2333)     LOS: 33 days        Mauricio Gerome Apley, MD

## 2019-01-15 DIAGNOSIS — Z931 Gastrostomy status: Secondary | ICD-10-CM

## 2019-01-15 LAB — GLUCOSE, CAPILLARY
Glucose-Capillary: 209 mg/dL — ABNORMAL HIGH (ref 70–99)
Glucose-Capillary: 186 mg/dL — ABNORMAL HIGH (ref 70–99)
Glucose-Capillary: 210 mg/dL — ABNORMAL HIGH (ref 70–99)
Glucose-Capillary: 186 mg/dL — ABNORMAL HIGH (ref 70–99)
Glucose-Capillary: 202 mg/dL — ABNORMAL HIGH (ref 70–99)

## 2019-01-15 LAB — CREATININE, SERUM
Creatinine, Ser: 0.61 mg/dL (ref 0.61–1.24)
GFR calc Af Amer: >60 mL/min (ref >60)
GFR calc non Af Amer: >60 mL/min (ref >60)

## 2019-01-15 NOTE — Care Management Important Message (Signed)
Important Message  Patient Details  Name: Julian Tyler MRN: [id number] Date of Birth: [birth year]   Medicare Important Message Given:  Yes     Orbie Pyo 01/15/2019, 12:37 PM

## 2019-01-15 NOTE — Progress Notes (Signed)
Chart reviewed. B Kerney Hopfensperger RN,MHA,BSN Advanced Care Supervisor 

## 2019-01-15 NOTE — Progress Notes (Signed)
PROGRESS NOTE    Julian Tyler  EXH:[id number] DOB: 01/15/1946 DOA: 12/11/2018 PCP: Milana Na., MD    Brief Narrative:  73 year old male who presented with altered mental status. He does have significant past medical history for hypertension, dyslipidemia, type 2 diabetes mellitus, history of stroke, seizures anddementia. Patient was noted to have right-sided weakness, difficulty talking, andslurred speech. He had agitation and received recently combination ofSeroquel, alprazolam and Depakote. On his initial physical examination his blood pressure was 170/91, pulse rate 131, respiratoryrate27, oxygen saturation 96%. He was neurologically nonfocal, oriented only x1, his lungs were clear to auscultation bilaterally, heart S1-S2 present rhythmic, his abdomen soft, nolowerextremity edema.Sodium 158, potassium 4.9, chloride 129, bicarb 19, glucose 200, BUN 67, creatinine 1.5.Urinalysis had more than 50 white cells, 6-10 red cells, specific gravity more than 1.046.  Patient was admitted to the hospitalwith aworking diagnosis of metabolic encephalopathy complicated by hypernatremia, dehydration and acute kidney injury.   Patient developed persistent encephalopathy with featuresofdelirium, severe calorie protein malnutrition with swallow dysfunction.APEG tubehas been placedon 07.28.20, for continue nutritional supplementation. His psychotropic and seizure medications have been adjusted   Assessment & Plan:   Principal Problem:   Acute metabolic encephalopathy Active Problems:   Goals of care, counseling/discussion   Severe protein-calorie malnutrition (HCC)   Anemia   Palliative care by specialist   Vascular dementia without behavioral disturbance ([place])   Seizure ([place])   Dysphagia   Adult failure to thrive   Type 2 diabetes mellitus without complication (HCC)   Pressure injury of skin   Aspiration into airway   PEG (percutaneous endoscopic gastrostomy)  status ([place])   1. Acute metabolic encephalopathy with delirium, in the setting of vascular dementia and behavioral disturbance.This am is more somnolent than yesterday, is off restrains and tolerating tube feedings well. Positive fever spike at 23:35 pm yesterday at 101.8, but no further fever today. Will continue neuro checks and aspiration precautions.   He has not required haldol over last 24 H, will continue with qhs 5 mg olanzapine. Needs physical therapy evaluation, in order for SNF disposition.   2. Hx of CVA with swallow dysfunction.Continue with aspiration precautions.  3. Seizures.Continue with keppra and lacosemide. No clinical signs of seizures.  4. T2DM.Glucose 186 this am, will continue with insulin sliding scale for glucose cover and monitoring.Patient is tolerating tube feedings well.  5. Right heal deep tissue injury not able to stage, and stage 2 left buttock. (unclear if present on admission).Continue with local wound care.  6. Severe protein malnutrition.Continue with nutritionalsupplements.  7.AKI.Clinically resolved.Follow bmp in am.   8. Obesity.Calculated BMI33.5.  9. HTN.Continue blood pressure control with lisinopril, metoprolol, hydralazine and amlodipine.   DVT prophylaxis:enoxaparin Code Status:full Family Communication:no family at the bedside Disposition Plan/ discharge barriers:pending placement, return to SNF (needs to be off restrains)    Body mass index is 39.08 kg/m. Malnutrition Type:  Nutrition Problem: Inadequate oral intake Etiology: inability to eat   Malnutrition Characteristics:  Signs/Symptoms: NPO status   Nutrition Interventions:  Interventions: Refer to RD note for recommendations  RN Pressure Injury Documentation: Pressure Injury 12/26/18 Heel Right Deep Tissue Injury - Purple or maroon localized area of discolored intact skin or blood-filled blister due to damage of underlying soft  tissue from pressure and/or shear. right heel deep tissue injury (Active)  12/26/18 1400  Location: Heel  Location Orientation: Right  Staging: Deep Tissue Injury - Purple or maroon localized area of discolored intact skin or blood-filled blister due to damage of  underlying soft tissue from pressure and/or shear.  Wound Description (Comments): right heel deep tissue injury  Present on Admission:      Pressure Injury 01/10/19 Buttocks Left Stage II -  Partial thickness loss of dermis presenting as a shallow open ulcer with a red, pink wound bed without slough. (Active)  01/10/19 1630  Location: Buttocks  Location Orientation: Left  Staging: Stage II -  Partial thickness loss of dermis presenting as a shallow open ulcer with a red, pink wound bed without slough.  Wound Description (Comments):   Present on Admission:      Consultants:   IR   Procedures:   PEG  Antimicrobials:       Subjective: This am patient is off restrains, is not agitated or combative. This am somnolent, and tolerating tube feedings well.   Objective: Vitals:   01/14/19 2335 01/15/19 0355 01/15/19 0500 01/15/19 0757  BP: (!) 159/110 (!) 172/70  (!) 162/69  Pulse: 98 (!) 107  (!) 106  Resp: 17 18  18   Temp: (!) 101.8 F (38.8 C) 99.8 F (37.7 C)  100 F (37.8 C)  TempSrc: Axillary Oral  Axillary  SpO2: 99% 99%  98%  Weight:   116.6 kg     Intake/Output Summary (Last 24 hours) at 01/15/2019 1146 Last data filed at 01/14/2019 1700 Gross per 24 hour  Intake -  Output 1000 ml  Net -1000 ml   Filed Weights   01/11/19 0436 01/12/19 0500 01/15/19 0500  Weight: 100.2 kg 120.2 kg 116.6 kg    Examination:   General: deconditioned  Neurology: patient is somnolent, off restrains. Opens yes to touch. Not following commands.   E ENT: mild pallor, no icterus, oral mucosa moist Cardiovascular: No JVD. S1-S2 present, rhythmic, no gallops, rubs, or murmurs. No lower extremity edema. Pulmonary: positive  breath sounds bilaterally, adequate air movement, no wheezing, rhonchi or rales. Gastrointestinal. Abdomen with no organomegaly, non tender, no rebound or guarding. PEG tube in place. Skin. No rashes Musculoskeletal: no joint deformities     Data Reviewed: I have personally reviewed following labs and imaging studies  CBC: No results for input(s): WBC, NEUTROABS, HGB, HCT, MCV, PLT in the last 168 hours. Basic Metabolic Panel: Recent Labs  Lab 01/15/19 1039  CREATININE 0.61   GFR: Estimated Creatinine Clearance: 103.5 mL/min (by C-G formula based on SCr of 0.61 mg/dL). Liver Function Tests: No results for input(s): AST, ALT, ALKPHOS, BILITOT, PROT, ALBUMIN in the last 168 hours. No results for input(s): LIPASE, AMYLASE in the last 168 hours. No results for input(s): AMMONIA in the last 168 hours. Coagulation Profile: No results for input(s): INR, PROTIME in the last 168 hours. Cardiac Enzymes: No results for input(s): CKTOTAL, CKMB, CKMBINDEX, TROPONINI in the last 168 hours. BNP (last 3 results) No results for input(s): PROBNP in the last 8760 hours. HbA1C: No results for input(s): HGBA1C in the last 72 hours. CBG: Recent Labs  Lab 01/14/19 1610 01/14/19 1949 01/14/19 2333 01/15/19 0450 01/15/19 0754  GLUCAP 199* 215* 195* 209* 186*   Lipid Profile: No results for input(s): CHOL, HDL, LDLCALC, TRIG, CHOLHDL, LDLDIRECT in the last 72 hours. Thyroid Function Tests: No results for input(s): TSH, T4TOTAL, FREET4, T3FREE, THYROIDAB in the last 72 hours. Anemia Panel: No results for input(s): VITAMINB12, FOLATE, FERRITIN, TIBC, IRON, RETICCTPCT in the last 72 hours.    Radiology Studies: I have reviewed all of the imaging during this hospital visit personally     Scheduled Meds: .  amLODipine  10 mg Per Tube Daily  . aspirin  325 mg Per Tube Daily  . chlorhexidine  15 mL Mouth Rinse BID  . dorzolamide  1 drop Both Eyes BID  . feeding supplement (PRO-STAT SUGAR  FREE 64)  30 mL Per Tube Daily  . ferrous sulfate  300 mg Per Tube BID WC  . hydrALAZINE  100 mg Per Tube BID  . insulin aspart  0-9 Units Subcutaneous Q4H  . lacosamide  50 mg Per Tube BID  . levETIRAcetam  500 mg Per Tube BID  . lisinopril  20 mg Per Tube Daily  . mouth rinse  15 mL Mouth Rinse q12n4p  . metoprolol tartrate  25 mg Per Tube BID  . neomycin-bacitracin-polymyxin  1 application Topical Daily  . nicotine  21 mg Transdermal Daily  . OLANZapine  5 mg Per Tube QHS  . polyethylene glycol  17 g Per Tube BID  . senna  1 tablet Per Tube QHS   Continuous Infusions: . feeding supplement (JEVITY 1.5 CAL/FIBER) 1,000 mL (01/11/19 2333)     LOS: 34 days        Mauricio Gerome Apley, MD

## 2019-01-16 ENCOUNTER — Inpatient Hospital Stay (HOSPITAL_COMMUNITY): Payer: Medicare Other

## 2019-01-16 DIAGNOSIS — I959 Hypotension, unspecified: Secondary | ICD-10-CM

## 2019-01-16 DIAGNOSIS — E1165 Type 2 diabetes mellitus with hyperglycemia: Secondary | ICD-10-CM

## 2019-01-16 DIAGNOSIS — E870 Hyperosmolality and hypernatremia: Principal | ICD-10-CM

## 2019-01-16 DIAGNOSIS — E43 Unspecified severe protein-calorie malnutrition: Secondary | ICD-10-CM

## 2019-01-16 DIAGNOSIS — R1310 Dysphagia, unspecified: Secondary | ICD-10-CM

## 2019-01-16 DIAGNOSIS — G9341 Metabolic encephalopathy: Secondary | ICD-10-CM

## 2019-01-16 DIAGNOSIS — N179 Acute kidney failure, unspecified: Secondary | ICD-10-CM

## 2019-01-16 LAB — CBC WITH DIFFERENTIAL/PLATELET
Abs Immature Granulocytes: 0.05 10*3/uL (ref 0.00–0.07)
Basophils Absolute: 0 10*3/uL (ref 0.0–0.1)
Basophils Relative: 0 %
Eosinophils Absolute: 0.2 10*3/uL (ref 0.0–0.5)
Eosinophils Relative: 1 %
HCT: 28.4 % — ABNORMAL LOW (ref 39.0–52.0)
Hemoglobin: 8.4 g/dL — ABNORMAL LOW (ref 13.0–17.0)
Immature Granulocytes: 0 %
Lymphocytes Relative: 15 %
Lymphs Abs: 2.2 10*3/uL (ref 0.7–4.0)
MCH: 23.1 pg — ABNORMAL LOW (ref 26.0–34.0)
MCHC: 29.6 g/dL — ABNORMAL LOW (ref 30.0–36.0)
MCV: 78.2 fL — ABNORMAL LOW (ref 80.0–100.0)
Monocytes Absolute: 1.3 10*3/uL — ABNORMAL HIGH (ref 0.1–1.0)
Monocytes Relative: 9 %
Neutro Abs: 10.8 10*3/uL — ABNORMAL HIGH (ref 1.7–7.7)
Neutrophils Relative %: 75 %
Platelets: 399 10*3/uL (ref 150–400)
RBC: 3.63 MIL/uL — ABNORMAL LOW (ref 4.22–5.81)
RDW: 16.4 % — ABNORMAL HIGH (ref 11.5–15.5)
WBC: 14.5 10*3/uL — ABNORMAL HIGH (ref 4.0–10.5)
nRBC: 0 % (ref 0.0–0.2)

## 2019-01-16 LAB — GLUCOSE, CAPILLARY
Glucose-Capillary: 164 mg/dL — ABNORMAL HIGH (ref 70–99)
Glucose-Capillary: 166 mg/dL — ABNORMAL HIGH (ref 70–99)
Glucose-Capillary: 166 mg/dL — ABNORMAL HIGH (ref 70–99)
Glucose-Capillary: 171 mg/dL — ABNORMAL HIGH (ref 70–99)
Glucose-Capillary: 176 mg/dL — ABNORMAL HIGH (ref 70–99)
Glucose-Capillary: 216 mg/dL — ABNORMAL HIGH (ref 70–99)

## 2019-01-16 LAB — BASIC METABOLIC PANEL
Anion gap: 13 (ref 5–15)
BUN: 17 mg/dL (ref 8–23)
CO2: 23 mmol/L (ref 22–32)
Calcium: 9.3 mg/dL (ref 8.9–10.3)
Chloride: 101 mmol/L (ref 98–111)
Creatinine, Ser: 0.62 mg/dL (ref 0.61–1.24)
GFR calc Af Amer: >60 mL/min (ref >60)
GFR calc non Af Amer: >60 mL/min (ref >60)
Glucose, Bld: 180 mg/dL — ABNORMAL HIGH (ref 70–99)
Potassium: 3.9 mmol/L (ref 3.5–5.1)
Sodium: 137 mmol/L (ref 135–145)

## 2019-01-16 MED ORDER — SODIUM CHLORIDE 0.9% FLUSH: 10.0000 mL | INTRAVENOUS | Status: DC | PRN

## 2019-01-16 MED ORDER — SODIUM CHLORIDE 0.9 % IV BOLUS
500.0000 mL | Freq: Once | INTRAVENOUS | Status: AC
  Administered 2019-01-16: 500 mL via INTRAVENOUS

## 2019-01-16 MED ORDER — SODIUM CHLORIDE 0.9% FLUSH
10.0000 mL | Freq: Two times a day (BID) | INTRAVENOUS | Status: DC
  Administered 2019-01-16 – 2019-01-22 (×12): 10 mL

## 2019-01-16 MED ORDER — NICOTINE 14 MG/24HR TD PT24
14.0000 mg | MEDICATED_PATCH | Freq: Every day | TRANSDERMAL | Status: DC
  Administered 2019-01-17 – 2019-01-24 (×8): 14 mg via TRANSDERMAL
  Filled 2019-01-16 (×8): qty 1

## 2019-01-16 NOTE — Progress Notes (Signed)
RN notified by NT of patient complaining of new headache and low BP (126/43). I went to assess the patient and he endorsed a 10/10 headache and pointed to the R side of his head. The patient also endorsed tingling "all over the right side" of his face. No other new deficits noted. At this time I paged Dr Wynelle Cleveland, and rapid response RN called. Rechecked BP was 113/53. IV access obtained by IV team RN,  and 500cc bolus of NS was ordered and started. Bolus currently infusing. Will recheck BP and neuro status at the completion of bolus.

## 2019-01-16 NOTE — Evaluation (Signed)
Occupational Therapy Evaluation Patient Details Name: Julian Tyler MRN: [id number] DOB: [birth year] Today's Date: 01/16/2019    History of Present Illness Pt is a 73 y/o nursing home resident who presents with AMS. MRI negative, and acute metabolic encephalopathy suspected to be 2 dehydration and hypernatremia. Possible UTI as well. PMH: stroke, CKD, Dry eyes, DM, hyperlipidemia   Clinical Impression   Pt admitted with above and presents to OT with impairments (listed below) impacting ability to engage in ADLs at [facility].  Pt re-evaluated as insurance will need updated therapy notes.  Pt currently requires total assist for bed level mobility to complete self-care tasks, would require +2 for EOB activity.  Pt did demonstrate increased arousal and participation during session.  Pt would benefit from OT acutely in preparation for d/c to SNF.    Follow Up Recommendations  SNF    Equipment Recommendations    TBD (defer to next venue of care)      Precautions / Restrictions Precautions Precautions: Fall;Other (comment) Precaution Comments: PEG tube Required Braces or Orthoses: Other Brace Other Brace: abdominal binder, heel protectors      Mobility Bed Mobility Overal bed mobility: Needs Assistance Bed Mobility: Rolling Rolling: Total assist         General bed mobility comments: Total assist to roll bil left more difficult than right as once pt rolls right he can be cued to hold the right bed rail and hold that position.  He did not significantly help with pushing or pulling over to his side, bed pad used to help facilitate. Unable to get pt EOB with only one person assist.  Transfers                 General transfer comment: would need total lift/mechanical lift for safe transfer OOB to chair.         ADL either performed or assessed with clinical judgement   ADL Overall ADL's : Needs assistance/impaired Eating/Feeding: Total assistance   Grooming: Bed level;Minimal  assistance                               Functional mobility during ADLs: Total assistance General ADL Comments: Pt requires total assist with bathing/dressing, however is more alert and engaged than previously reported, able to follow commands inconsistently to engage in movement.     Vision   Vision Assessment?: Vision impaired- to be further tested in functional context            Pertinent Vitals/Pain Pain Assessment: Faces Faces Pain Scale: Hurts even more Pain Location: with movement of R UE and LE, spastic, flexion response     Hand Dominance Right   Extremity/Trunk Assessment Upper Extremity Assessment Upper Extremity Assessment: RUE deficits/detail RUE: Unable to fully assess due to pain RUE Sensation: (hypersensitive to touch) RUE Coordination: decreased fine motor;decreased gross motor           Communication Communication Communication: Expressive difficulties;Other (comment)(able to answer some questions, often one word responses)   Cognition Arousal/Alertness: Lethargic Behavior During Therapy: Flat affect Overall Cognitive Status: No family/caregiver present to determine baseline cognitive functioning Area of Impairment: Attention;Orientation;Memory;Following commands;Safety/judgement;Awareness;Problem solving                 Orientation Level: Disoriented to;Situation;Time;Place Current Attention Level: Sustained Memory: Decreased short-term memory Following Commands: Follows one step commands consistently Safety/Judgement: Decreased awareness of safety;Decreased awareness of deficits Awareness: Emergent Problem Solving: Decreased initiation;Difficulty sequencing;Requires verbal  cues;Requires tactile cues General Comments: Pt has difficulty initiating movement to roll/sequenc without multimodal cues.  He was able to report he is in the hospital and says he goes by Julian Tyler (this is different than earlier reports).  He was able to sustain  attention to allow for AAROM to [place] expects to be discharged to:: Skilled nursing facility                                        Prior Functioning/Environment Level of Independence: Needs assistance        Comments: unknown prior level of function. Patient with wound guards on B/L heels        OT Problem List: Decreased strength;Decreased range of motion;Decreased activity tolerance;Impaired balance (sitting and/or standing);Decreased coordination;Impaired vision/perception;Decreased cognition;Decreased safety awareness;Pain      OT Treatment/Interventions: Self-care/ADL training;Therapeutic exercise;Neuromuscular education;Energy conservation;DME and/or AE instruction;Therapeutic activities;Visual/perceptual remediation/compensation;Cognitive remediation/compensation;Patient/family education;Balance training    OT Goals(Current goals can be found in the care plan section) Acute Rehab OT Goals Patient Stated Goal: did not state OT Goal Formulation: Patient unable to participate in goal setting Time For Goal Achievement: 01/30/19 Potential to Achieve Goals: Fair  OT Frequency: Min 1X/week    AM-PAC OT "6 Clicks" Daily Activity     Outcome Measure Help from another person eating meals?: Total Help from another person taking care of personal grooming?: Total Help from another person toileting, which includes using toliet, bedpan, or urinal?: Total Help from another person bathing (including washing, rinsing, drying)?: Total Help from another person to put on and taking off regular upper body clothing?: Total Help from another person to put on and taking off regular lower body clothing?: Total 6 Click Score: 6   End of Session Nurse Communication: Mobility status  Activity Tolerance: Patient limited by fatigue Patient left: in bed;with call bell/phone within reach;with bed alarm set;with SCD's reapplied  OT Visit  Diagnosis: Muscle weakness (generalized) (M62.81);Cognitive communication deficit (R41.841) Symptoms and signs involving cognitive functions: Other cerebrovascular disease                Time: 2297-9892 OT Time Calculation (min): 16 min Charges:  OT General Charges $OT Visit: 1 Visit OT Evaluation $OT Eval Low Complexity: [place], [place], [place] 01/16/2019, 3:57 PM

## 2019-01-16 NOTE — Progress Notes (Signed)
OT Cancellation Note  Patient Details Name: Julian Tyler MRN: [id number] DOB: 1946/01/24   Cancelled Treatment:    Reason Eval/Treat Not Completed: Medical issues which prohibited therapy(RN reports pt with new onset HA and Rt sided numbness/tingling)   RN requests therapist return later in day after bolus received.  Will attempt to return as time/schedule allows.  [name] 01/16/2019, 2:22 PM

## 2019-01-16 NOTE — Significant Event (Addendum)
Rapid Response Event Note  Overview: Neurologic - Severe HA and Tingling RT Side of Face  Initial Focused Assessment: Called by 3W nurses about Mr. Findlay having an acute onset of a severe headache coupled with right sided facial tingling. When I arrived [facility] MD and nursing staff were present. Patient was alert, oriented to self/place/situation but disoriented to time. Baseline has history of strokes/seizures/dementia - he did present with RT sided weakness and slurred speech.   Upon my arrival, BLE 2/5 - quite weak but equally as weak, LUE no drift/no weakness. RUE - very weak and quite painful. Patient denied a headache or sensory changes for my exam. BP 126/43, HR 94, 100% on RA, RR normal, not in acute distress. HOB lowered to promote perfusion to the brain, skin warm but quite, veins flat.   Interventions: -- NS Bolus 500 cc x 1  -- No PIV access - I attempted x 2 - VAST RN consulted-- Midline placed at 1300, bolus started  Plan of Care: -- Once PIV is established, RN to give fluid bolus and re-assess neurologic status  -- Monitor VS  Event Summary:  Call Time 1148 Arrival Time 1151 End Time [place], [place]

## 2019-01-16 NOTE — Progress Notes (Signed)
PROGRESS NOTE    Julian Tyler   VHQ:[id number]  DOB: 08-19-1945  DOA: 12/11/2018 PCP: Puschinsky, Fransico Him., MD   Brief Narrative:  Julian Tyler is a 73 y/o with HTN, HLD, DM 2, CVA, seizure disorder, dementia who presents with confusion, agitation, sodium 158, dehydration and AKI.  Due to ongoing inability to swallow, he has received a PEG tube. His agitation/ delirium was severe enough to require restrains and adjustment of medications.   Subjective: He had no complaints when evaluated this AM. Later told nurse that he had a headache and left facial numbness but about 20 min later, he denied this. I did return to the bedside. Rapid response RN subsequently arrived. He was sleepy and BP was low at 113/53. A NS bolus was given, BP improved to 147/ 67 and he remained asymptomatic in regards to headache and numbness.     Assessment & Plan:    Principal Problem:   Delirium/ dementia - h/o dementia and mental status changes may be relate to progression of this- Haldol PRN and Olanzapine QHS are being use in the hospital for behavioral disturbances- he has been stable over the past few days- have d/c'd Haldol today.  Active Problems:     Severe protein-calorie malnutrition/ dehydration, AKI and hypernatremia - noted to have dysphagia possibly due to prior CVA ? - hydrated with IVF - now has PEG tube and has been tolerating tube feeds (Jevity) - cont Prostat  Recent fever, leukocytosis - temp 101.8 on 8/3, now 99 - WBC is 14.5 - CXR unrevealing- no cough, dyspnea or hypoxia - f/u on UA- Pt has not voided today despite a fluid bolus-  Has > 450 cc per bladder scan- RNs unable to do I and O cath due to resistence ? BPH- Urology states we should wait and continue to monitor for now rather than pursuing a foley- have instructed RN to touch base with me in a few hours   Hypotension - episode of hypotension earlier today noted - patient was slightly somnolent as well- he had not  received any sedatives today and ? If this was due to hypotension- given a 500 cc bolus with improvement in mental status and BP  H/o CVA - cont ASA  Seizure disorder - cont Vimpat and Keppra  Essential HTN - cont Metoprolol, Hydralazine, Amlodipine and Lisinopril- monitor closely for recurrent hypotension   Type 2 diabetes mellitus without complication - cont SSI  Nicotine abuse - taper nicotine patch  Obesity Body mass index is 39.08 kg/m.    Time spent in minutes: 45 min in reviewing prior notes, labs and imaging reports and returning to bedside when he became hypotensive  DVT prophylaxis: SCDs Code Status: Full code Family Communication:  Disposition Plan: SNF Consultants:    Procedures:   PEG tube  2 d ECHO  1. The left ventricle has normal systolic function with an ejection fraction of 60-65%. The cavity size was normal. There is mildly increased left ventricular wall thickness. Left ventricular diastolic Doppler parameters are consistent with impaired  relaxation. No evidence of left ventricular regional wall motion abnormalities.  2. The right ventricle has normal systolic function. The cavity was normal. There is no increase in right ventricular wall thickness.  3. Trivial pericardial effusion is present.  4. Mild calcification of the mitral valve leaflet. There is mild mitral annular calcification present. No evidence of mitral valve stenosis. No significant mitral regurgitation.  5. The aortic valve is tricuspid. Moderate calcification of the  aortic valve. No stenosis of the aortic valve. In the parasternal long axis view, there appears to be a small mobile mass on the atrial size of the aortic valve, possible vegetation. Poor  images on this study, cannot see aortic valve well in other views. Would suggest TEE to evaluate more closely.  6. The aortic root is normal in size and structure.  7. IVC was normal in size. PA systolic pressure 35 mmHg.  8. Technically  difficult study with poor acoustic windows.  Antimicrobials:  Anti-infectives (From admission, onward)   Start     Dose/Rate Route Frequency Ordered Stop   01/09/19 1230  ceFAZolin (ANCEF) IVPB 2g/100 mL premix     2 g 200 mL/hr over 30 Minutes Intravenous To Radiology 01/09/19 1215 01/09/19 1302   01/07/19 0000  ceFAZolin (ANCEF) IVPB 2g/100 mL premix     2 g 200 mL/hr over 30 Minutes Intravenous To Radiology 01/04/19 1336 01/07/19 0015   12/12/18 2100  cefTRIAXone (ROCEPHIN) 1 g in sodium chloride 0.9 % 100 mL IVPB  Status:  Discontinued     1 g 200 mL/hr over 30 Minutes Intravenous Every 24 hours 12/12/18 2052 12/15/18 1932       Objective: Vitals:   01/15/19 2013 01/16/19 0013 01/16/19 0419 01/16/19 0750  BP: 136/78 (!) 151/74 (!) 163/62 (!) 150/74  Pulse: (!) 102 95 98 (!) 107  Resp: 18 17 20 15   Temp: 99.2 F (37.3 C) 99.6 F (37.6 C) 99.9 F (37.7 C) 99.4 F (37.4 C)  TempSrc: Oral Oral Oral Axillary  SpO2: 100% 100% 99% 99%  Weight:        Intake/Output Summary (Last 24 hours) at 01/16/2019 0937 Last data filed at 01/16/2019 0616 Gross per 24 hour  Intake 1340 ml  Output 950 ml  Net 390 ml   Filed Weights   01/11/19 0436 01/12/19 0500 01/15/19 0500  Weight: 100.2 kg 120.2 kg 116.6 kg    Examination: General exam: Appears comfortable  HEENT: PERRLA, oral mucosa moist, no sclera icterus or thrush Respiratory system: Clear to auscultation. Respiratory effort normal. Cardiovascular system: S1 & S2 heard, RRR.   Gastrointestinal system: Abdomen soft, non-tender, nondistended. Normal bowel sounds. Central nervous system: Alert and oriented only to person. Follows command intermittently- weak on right side Extremities: No cyanosis, clubbing or edema Skin: No rashes or ulcers Psychiatry:  Flat affect    Data Reviewed: I have personally reviewed following labs and imaging studies  CBC: Recent Labs  Lab 01/16/19 0428  WBC 14.5*  NEUTROABS 10.8*  HGB 8.4*   HCT 28.4*  MCV 78.2*  PLT 573   Basic Metabolic Panel: Recent Labs  Lab 01/15/19 1039 01/16/19 0428  NA  --  137  K  --  3.9  CL  --  101  CO2  --  23  GLUCOSE  --  180*  BUN  --  17  CREATININE 0.61 0.62  CALCIUM  --  9.3   GFR: Estimated Creatinine Clearance: 103.5 mL/min (by C-G formula based on SCr of 0.62 mg/dL). Liver Function Tests: No results for input(s): AST, ALT, ALKPHOS, BILITOT, PROT, ALBUMIN in the last 168 hours. No results for input(s): LIPASE, AMYLASE in the last 168 hours. No results for input(s): AMMONIA in the last 168 hours. Coagulation Profile: No results for input(s): INR, PROTIME in the last 168 hours. Cardiac Enzymes: No results for input(s): CKTOTAL, CKMB, CKMBINDEX, TROPONINI in the last 168 hours. BNP (last 3 results) No results for  input(s): PROBNP in the last 8760 hours. HbA1C: No results for input(s): HGBA1C in the last 72 hours. CBG: Recent Labs  Lab 01/15/19 1551 01/15/19 2012 01/16/19 0012 01/16/19 0421 01/16/19 0749  GLUCAP 186* 202* 176* 171* 166*   Lipid Profile: No results for input(s): CHOL, HDL, LDLCALC, TRIG, CHOLHDL, LDLDIRECT in the last 72 hours. Thyroid Function Tests: No results for input(s): TSH, T4TOTAL, FREET4, T3FREE, THYROIDAB in the last 72 hours. Anemia Panel: No results for input(s): VITAMINB12, FOLATE, FERRITIN, TIBC, IRON, RETICCTPCT in the last 72 hours. Urine analysis:    Component Value Date/Time   COLORURINE AMBER (A) 12/12/2018 0108   APPEARANCEUR HAZY (A) 12/12/2018 0108   LABSPEC >1.046 (H) 12/12/2018 0108   PHURINE 5.0 12/12/2018 0108   GLUCOSEU NEGATIVE 12/12/2018 0108   HGBUR SMALL (A) 12/12/2018 0108   BILIRUBINUR NEGATIVE 12/12/2018 0108   KETONESUR NEGATIVE 12/12/2018 0108   PROTEINUR 30 (A) 12/12/2018 0108   NITRITE NEGATIVE 12/12/2018 0108   LEUKOCYTESUR LARGE (A) 12/12/2018 0108   Sepsis Labs: @LABRCNTIP (procalcitonin:4,lacticidven:4) ) Recent Results (from the past 240 hour(s))   SARS Coronavirus 2 (CEPHEID - Performed in [facility] hospital lab), Hosp Order     Status: None   Collection Time: 01/07/19  8:16 AM   Specimen: Nasopharyngeal Swab  Result Value Ref Range Status   SARS Coronavirus 2 NEGATIVE NEGATIVE Final    Comment: (NOTE) If result is NEGATIVE SARS-CoV-2 target nucleic acids are NOT DETECTED. The SARS-CoV-2 RNA is generally detectable in upper and lower  respiratory specimens during the acute phase of infection. The lowest  concentration of SARS-CoV-2 viral copies this assay can detect is 250  copies / mL. A negative result does not preclude SARS-CoV-2 infection  and should not be used as the sole basis for treatment or other  patient management decisions.  A negative result may occur with  improper specimen collection / handling, submission of specimen other  than nasopharyngeal swab, presence of viral mutation(s) within the  areas targeted by this assay, and inadequate number of viral copies  (<250 copies / mL). A negative result must be combined with clinical  observations, patient history, and epidemiological information. If result is POSITIVE SARS-CoV-2 target nucleic acids are DETECTED. The SARS-CoV-2 RNA is generally detectable in upper and lower  respiratory specimens dur ing the acute phase of infection.  Positive  results are indicative of active infection with SARS-CoV-2.  Clinical  correlation with patient history and other diagnostic information is  necessary to determine patient infection status.  Positive results do  not rule out bacterial infection or co-infection with other viruses. If result is PRESUMPTIVE POSTIVE SARS-CoV-2 nucleic acids MAY BE PRESENT.   A presumptive positive result was obtained on the submitted specimen  and confirmed on repeat testing.  While 2019 novel coronavirus  (SARS-CoV-2) nucleic acids may be present in the submitted sample  additional confirmatory testing may be necessary for epidemiological   and / or clinical management purposes  to differentiate between  SARS-CoV-2 and other Sarbecovirus currently known to infect humans.  If clinically indicated additional testing with an alternate test  methodology [phone]) is advised. The SARS-CoV-2 RNA is generally  detectable in upper and lower respiratory sp ecimens during the acute  phase of infection. The expected result is Negative. Fact Sheet for Patients:  StrictlyIdeas.no Fact Sheet for Healthcare Providers: BankingDealers.co.za This test is not yet approved or cleared by the [place] FDA and has been authorized for detection and/or diagnosis of SARS-CoV-2 by  FDA under an Emergency Use Authorization (EUA).  This EUA will remain in effect (meaning this test can be used) for the duration of the COVID-19 declaration under Section 564(b)(1) of the Act, 21 U.S.C. section 360bbb-3(b)(1), unless the authorization is terminated or revoked sooner. Performed at [facility], [place] [address], [place], [place] [address]   SARS Coronavirus 2 [facility] order, Performed in [facility] hospital lab)     Status: None   Collection Time: 01/14/19 11:20 AM  Result Value Ref Range Status   SARS Coronavirus 2 NEGATIVE NEGATIVE Final    Comment: (NOTE) If result is NEGATIVE SARS-CoV-2 target nucleic acids are NOT DETECTED. The SARS-CoV-2 RNA is generally detectable in upper and lower  respiratory specimens during the acute phase of infection. The lowest  concentration of SARS-CoV-2 viral copies this assay can detect is 250  copies / mL. A negative result does not preclude SARS-CoV-2 infection  and should not be used as the sole basis for treatment or other  patient management decisions.  A negative result may occur with  improper specimen collection / handling, submission of specimen other  than nasopharyngeal swab, presence of viral mutation(s) within the  areas targeted by this assay,  and inadequate number of viral copies  (<250 copies / mL). A negative result must be combined with clinical  observations, patient history, and epidemiological information. If result is POSITIVE SARS-CoV-2 target nucleic acids are DETECTED. The SARS-CoV-2 RNA is generally detectable in upper and lower  respiratory specimens dur ing the acute phase of infection.  Positive  results are indicative of active infection with SARS-CoV-2.  Clinical  correlation with patient history and other diagnostic information is  necessary to determine patient infection status.  Positive results do  not rule out bacterial infection or co-infection with other viruses. If result is PRESUMPTIVE POSTIVE SARS-CoV-2 nucleic acids MAY BE PRESENT.   A presumptive positive result was obtained on the submitted specimen  and confirmed on repeat testing.  While 2019 novel coronavirus  (SARS-CoV-2) nucleic acids may be present in the submitted sample  additional confirmatory testing may be necessary for epidemiological  and / or clinical management purposes  to differentiate between  SARS-CoV-2 and other Sarbecovirus currently known to infect humans.  If clinically indicated additional testing with an alternate test  methodology [phone]) is advised. The SARS-CoV-2 RNA is generally  detectable in upper and lower respiratory sp ecimens during the acute  phase of infection. The expected result is Negative. Fact Sheet for Patients:  StrictlyIdeas.no Fact Sheet for Healthcare Providers: BankingDealers.co.za This test is not yet approved or cleared by the [place] FDA and has been authorized for detection and/or diagnosis of SARS-CoV-2 by FDA under an Emergency Use Authorization (EUA).  This EUA will remain in effect (meaning this test can be used) for the duration of the COVID-19 declaration under Section 564(b)(1) of the Act, 21 U.S.C. section 360bbb-3(b)(1), unless  the authorization is terminated or revoked sooner. Performed at [facility], [place] [address], [place], [place] [address]          Radiology Studies: Dg Chest Port 1 View  Result Date: 01/16/2019 CLINICAL DATA:  Fever EXAM: PORTABLE CHEST 1 VIEW COMPARISON:  12/29/2018 FINDINGS: Cardiomediastinal contours are stable. Calcific aortic knob. Linear right basilar atelectasis. No new focal airspace consolidation. No large pleural fluid collection. No pneumothorax. IMPRESSION: Mild right basilar atelectasis. Electronically Signed   By: Davina Poke M.D.   On: 01/16/2019 08:27  Scheduled Meds: . amLODipine  10 mg Per Tube Daily  . aspirin  325 mg Per Tube Daily  . chlorhexidine  15 mL Mouth Rinse BID  . dorzolamide  1 drop Both Eyes BID  . feeding supplement (PRO-STAT SUGAR FREE 64)  30 mL Per Tube Daily  . ferrous sulfate  300 mg Per Tube BID WC  . hydrALAZINE  100 mg Per Tube BID  . insulin aspart  0-9 Units Subcutaneous Q4H  . lacosamide  50 mg Per Tube BID  . levETIRAcetam  500 mg Per Tube BID  . lisinopril  20 mg Per Tube Daily  . mouth rinse  15 mL Mouth Rinse q12n4p  . metoprolol tartrate  25 mg Per Tube BID  . neomycin-bacitracin-polymyxin  1 application Topical Daily  . nicotine  21 mg Transdermal Daily  . OLANZapine  5 mg Per Tube QHS  . polyethylene glycol  17 g Per Tube BID  . senna  1 tablet Per Tube QHS   Continuous Infusions: . feeding supplement (JEVITY 1.5 CAL/FIBER) 1,000 mL (01/15/19 1441)     LOS: 35 days      Debbe Odea, MD [facility] Pager: [email or web address] Password [facility] 01/16/2019, 9:37 AM

## 2019-01-16 NOTE — Progress Notes (Signed)
Pt unable to void since change of shift this am (0700). Bladder scan revealed >420 cc, and belly distended. Pt states he doesn't feel like he needs to urinate. In and out cath attempted by 2 different RNs, each time unsuccessful. Thick white discharge from urethra noted before first attempt. Resistance met, unable to advance catheter past prostate. MD notified. Awaiting orders.

## 2019-01-16 NOTE — Evaluation (Signed)
Physical Therapy Evaluation Patient Details Name: Julian Tyler MRN: [id number] DOB: [birth year] Today's Date: 01/16/2019   History of Present Illness  Pt is a 73 y/o nursing home resident who presents with AMS. MRI negative, and acute metabolic encephalopathy suspected to be 2 dehydration and hypernatremia. Possible UTI as well. PMH: stroke, CKD, Dry eyes, DM, hyperlipidemia  Clinical Impression  Pt was agreeable and participative to the best of his physical abilities with me today. He presents with R>L sided weakness and hypersensitivity and spasm on the right as well as cognitive deficits.  He is total assist for bed level activity and would require a second person for attempts at EOB.  He is appropriate for return to SNF at discharge.   PT to follow acutely for deficits listed below.       Follow Up Recommendations SNF    Equipment Recommendations  Wheelchair (measurements PT);Wheelchair cushion (measurements PT);Hospital bed;Other (comment)(hoyer lift, air mattress overlay)    Recommendations for Other Services   NA    Precautions / Restrictions Precautions Precautions: Fall;Other (comment) Precaution Comments: PEG tube Required Braces or Orthoses: Other Brace Other Brace: abdominal binder, heel protectors Restrictions Weight Bearing Restrictions: No      Mobility  Bed Mobility Overal bed mobility: Needs Assistance Bed Mobility: Rolling Rolling: Total assist         General bed mobility comments: Total assist to roll bil left more difficult than right as once pt rolls right he can be cued to hold the right bed rail and hold that position.  He did not significantly help with pushing or pulling over to his side, bed pad used to help facilitate.  Pt was able to help with cues for hand placementand sequencing move up towards [place] by pullin with L UE with bed in trendelenberg and therapist assist (mod) at hips and trunk. Unable to get pt EOB with only one person assist.    Transfers                 General transfer comment: would need total lift/mechanical lift for safe transfer OOB to chair.   Ambulation/Gait             General Gait Details: Non ambulatory            Pertinent Vitals/Pain Pain Assessment: Faces Faces Pain Scale: Hurts whole lot Pain Location: with movement of R UE and LE, spastic, flexion response Pain Descriptors / Indicators: Guarding;Grimacing;Cramping Pain Intervention(s): Limited activity within patient's tolerance;Monitored during session;Premedicated before session;Repositioned    Home Living Family/patient expects to be discharged to:: Skilled nursing facility                      Prior Function Level of Independence: Needs assistance         Comments: unknown prior level of function. Patient with wound guards on B/L heels     Hand Dominance   Dominant Hand: Right    Extremity/Trunk Assessment   Upper Extremity Assessment Upper Extremity Assessment: Defer to OT evaluation    Lower Extremity Assessment Lower Extremity Assessment: RLE deficits/detail;LLE deficits/detail RLE Deficits / Details: right leg with flexion spasms to touch and attempts at ROM that are painful.  This leg at rest is positioned in ER and flexion.   RLE Sensation: (hypersensative to touch) RLE Coordination: decreased gross motor LLE Deficits / Details: left leg can move to command, but pt is unable to lift it against gravity and per bed level assessment is  2-2+/5 grossly.  Arm is stronger than leg on this side.  LLE Sensation: WNL LLE Coordination: WNL    Cervical / Trunk Assessment Cervical / Trunk Assessment: Normal  Communication   Communication: Expressive difficulties;Other (comment)(able to answer some questions, one word response, often repe)  Cognition Arousal/Alertness: Lethargic Behavior During Therapy: Flat affect Overall Cognitive Status: No family/caregiver present to determine baseline  cognitive functioning Area of Impairment: Attention;Orientation;Memory;Following commands;Safety/judgement;Awareness;Problem solving                 Orientation Level: Disoriented to;Situation;Time("hospital") Current Attention Level: Sustained Memory: Decreased short-term memory Following Commands: Follows one step commands consistently(consistant on L side) Safety/Judgement: Decreased awareness of safety;Decreased awareness of deficits Awareness: Emergent(R side is weak) Problem Solving: Decreased initiation;Difficulty sequencing;Requires verbal cues;Requires tactile cues General Comments: Pt has difficulty initiating movement to roll/sequenc without multimodal cues.  He was able to report he is in the hospital and says he goes by [name] (this is different than earlier reports).  He was able to sustain attention to do exercises on L LE.      General Comments      Exercises General Exercises - Lower Extremity Ankle Circles/Pumps: PROM;Right;5 reps;AAROM;Left;10 reps Heel Slides: PROM;Right;5 reps;AAROM;Left;10 reps Hip ABduction/ADduction: PROM;Right;5 reps;AAROM;Left;10 reps Straight Leg Raises: AAROM;Left;5 reps   Assessment/Plan    PT Assessment Patient needs continued PT services  PT Problem List Decreased strength;Decreased activity tolerance;Decreased range of motion;Decreased balance;Decreased mobility;Decreased coordination;Decreased cognition;Decreased knowledge of use of DME;Decreased safety awareness;Decreased knowledge of precautions       PT Treatment Interventions DME instruction;Functional mobility training;Therapeutic activities;Therapeutic exercise;Balance training;Neuromuscular re-education;Cognitive remediation;Patient/family education;Manual techniques;Modalities;Wheelchair mobility training    PT Goals (Current goals can be found in the Care Plan section)  Acute Rehab PT Goals Patient Stated Goal: did not state PT Goal Formulation: Patient unable to  participate in goal setting Time For Goal Achievement: 01/30/19 Potential to Achieve Goals: Fair    Frequency Min 1X/week    AM-PAC PT "6 Clicks" Mobility  Outcome Measure Help needed turning from your back to your side while in a flat bed without using bedrails?: Total Help needed moving from lying on your back to sitting on the side of a flat bed without using bedrails?: Total Help needed moving to and from a bed to a chair (including a wheelchair)?: Total Help needed standing up from a chair using your arms (e.g., wheelchair or bedside chair)?: Total Help needed to walk in hospital room?: Total Help needed climbing 3-5 steps with a railing? : Total 6 Click Score: 6    End of Session   Activity Tolerance: Patient limited by pain Patient left: in bed;with call bell/phone within reach;with bed alarm set Nurse Communication: Mobility status PT Visit Diagnosis: Muscle weakness (generalized) (M62.81);Difficulty in walking, not elsewhere classified (R26.2);Pain;Hemiplegia and hemiparesis Hemiplegia - Right/Left: Right Hemiplegia - dominant/non-dominant: Dominant Hemiplegia - caused by: Cerebral infarction Pain - Right/Left: Right Pain - part of body: Leg    Time: 6761-9509 PT Time Calculation (min) (ACUTE ONLY): 14 min   Charges:          Wells Guiles B. Jonique Kulig, PT, DPT  Acute Rehabilitation [phone] pager [phone]) [phone] office  @ [facility]: [phone]   PT Evaluation $PT Eval Moderate Complexity: 1 Mod          01/16/2019, 11:50 AM

## 2019-01-17 LAB — GLUCOSE, CAPILLARY
Glucose-Capillary: 166 mg/dL — ABNORMAL HIGH (ref 70–99)
Glucose-Capillary: 173 mg/dL — ABNORMAL HIGH (ref 70–99)
Glucose-Capillary: 181 mg/dL — ABNORMAL HIGH (ref 70–99)
Glucose-Capillary: 196 mg/dL — ABNORMAL HIGH (ref 70–99)
Glucose-Capillary: 204 mg/dL — ABNORMAL HIGH (ref 70–99)
Glucose-Capillary: 234 mg/dL — ABNORMAL HIGH (ref 70–99)

## 2019-01-17 LAB — CBC
HCT: 26.3 % — ABNORMAL LOW (ref 39.0–52.0)
Hemoglobin: 7.7 g/dL — ABNORMAL LOW (ref 13.0–17.0)
MCH: 22.7 pg — ABNORMAL LOW (ref 26.0–34.0)
MCHC: 29.3 g/dL — ABNORMAL LOW (ref 30.0–36.0)
MCV: 77.6 fL — ABNORMAL LOW (ref 80.0–100.0)
Platelets: 390 10*3/uL (ref 150–400)
RBC: 3.39 MIL/uL — ABNORMAL LOW (ref 4.22–5.81)
RDW: 16.6 % — ABNORMAL HIGH (ref 11.5–15.5)
WBC: 14 10*3/uL — ABNORMAL HIGH (ref 4.0–10.5)
nRBC: 0 % (ref 0.0–0.2)

## 2019-01-17 LAB — URINALYSIS, ROUTINE W REFLEX MICROSCOPIC
Bilirubin Urine: NEGATIVE
Glucose, UA: NEGATIVE mg/dL
Hgb urine dipstick: NEGATIVE
Ketones, ur: NEGATIVE mg/dL
Nitrite: POSITIVE — AB
Protein, ur: NEGATIVE mg/dL
Specific Gravity, Urine: 1.019 (ref 1.005–1.030)
WBC, UA: >50 WBC/hpf — ABNORMAL HIGH (ref 0–5)
pH: 7 (ref 5.0–8.0)

## 2019-01-17 LAB — BASIC METABOLIC PANEL
Anion gap: 7 (ref 5–15)
BUN: 16 mg/dL (ref 8–23)
CO2: 26 mmol/L (ref 22–32)
Calcium: 8.8 mg/dL — ABNORMAL LOW (ref 8.9–10.3)
Chloride: 102 mmol/L (ref 98–111)
Creatinine, Ser: 0.73 mg/dL (ref 0.61–1.24)
GFR calc Af Amer: >60 mL/min (ref >60)
GFR calc non Af Amer: >60 mL/min (ref >60)
Glucose, Bld: 191 mg/dL — ABNORMAL HIGH (ref 70–99)
Potassium: 3.9 mmol/L (ref 3.5–5.1)
Sodium: 135 mmol/L (ref 135–145)

## 2019-01-17 MED ORDER — FREE WATER
200.0000 mL | Freq: Three times a day (TID) | Status: DC
  Administered 2019-01-17 – 2019-01-24 (×21): 200 mL

## 2019-01-17 MED ORDER — SODIUM CHLORIDE 0.9 % IV BOLUS
500.0000 mL | Freq: Once | INTRAVENOUS | Status: AC
  Administered 2019-01-17: 500 mL via INTRAVENOUS

## 2019-01-17 MED ORDER — FOLIC ACID 1 MG PO TABS
2.0000 mg | ORAL_TABLET | Freq: Every day | ORAL | Status: DC
  Administered 2019-01-17 – 2019-01-24 (×8): 2 mg
  Filled 2019-01-17 (×8): qty 2

## 2019-01-17 NOTE — TOC Progression Note (Signed)
Transition of Care [facility]) - Progression Note    Patient Details  Name: Said Rueb MRN: [id number] Date of Birth: [birth year]  Transition of Care [facility]) CM/SW [place], [place] Phone Number: 01/17/2019, 10:29 AM  Clinical Narrative:   CSW following for discharge plan. Patient has updated therapy notes in the chart, CSW sent to [facility] to start [facility] authorization. CSW contacted Cherie with [facility] to update her about therapy notes being received, and ask if she was able to discuss admission with patient's family. Cherie was able to make contact with son, Broadus John, and they are completing paperwork tomorrow. Cherie will update CSW with status of authorization.    Expected Discharge Plan: [place] Barriers to Discharge: Continued Medical Work up, Ship broker  Expected Discharge Plan and Services Expected Discharge Plan: [place] Choice: [place] Living arrangements for the past 2 months: [place] Expected Discharge Date: 01/10/19                                     Social Determinants of Health (SDOH) Interventions    Readmission Risk Interventions No flowsheet data found.

## 2019-01-17 NOTE — TOC Progression Note (Signed)
Transition of Care [facility]) - Progression Note    Patient Details  Name: Julian Tyler MRN: [id number] Date of Birth: Aug 25, 1945  Transition of Care [facility]) CM/SW [place], [place] Phone Number: 01/17/2019, 1:29 PM  Clinical Narrative:    Josem Kaufmann received for pt per admissions director Cheri at [facility].  Paperwork pending completion by family. Auth good for 7 days per admissions director; await medical stability to assist with pt discharge.   Expected Discharge Plan: [place] Barriers to Discharge: Continued Medical Work up, Ship broker  Expected Discharge Plan and Services Expected Discharge Plan: [place] Choice: [place] Living arrangements for the past 2 months: [place] Expected Discharge Date: 01/10/19                    Social Determinants of Health (SDOH) Interventions    Readmission Risk Interventions No flowsheet data found.

## 2019-01-17 NOTE — Progress Notes (Addendum)
PROGRESS NOTE    Julian Tyler   PPI:[id number]  DOB: [birth year]  DOA: 12/11/2018 PCP: Milana Na., MD   Brief Narrative:  Julian Tyler is a 73 y/o with HTN, HLD, DM 2, CVA with residual right sided weakness, seizure disorder, dementia who presents as a code stroke for left facial droop and left arm weakness.  MRI negative for CVA.  Admitted for ongoing confusion, agitation, sodium 158, dehydration and AKI. He recently had a hospital admission Due to ongoing inability to swallow, he has received a PEG tube. His agitation/ delirium was severe enough to require restrains and adjustment of medications.   Subjective: No complaints today.   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 Urinary retention - temp 101.8 on 8/3, now 99 - WBC is 14.5 - CXR unrevealing- no cough, dyspnea or hypoxia -UA noted to be + - we  are still awaiting a culture- I will not start antibiotics until this is done- yesterday he was holding on to > 100 cc in bladder after voiding (he is incontinent and has a condom cath) - will ask for another post void residual  Hypotension with h/o HN - episode of hypotension 8/5- BP 111/53 which was significantly below his normal - patient was very groggy as well- he had not received any sedatives today and was though to be due to hypotension- given a 500 cc bolus with improvement in mental status and BP - cont Metoprolol, Hydralazine, Amlodipine and Lisinopril- monitor closely for recurrent hypotension  H/o CVA - cont ASA  Abnormal echocardiogram with possible mobile density on aortic valve -Blood cultures have remained negative - The patient's son does not want to pursue any further work-up  Anemia- folate deficiency and Iron deficiency - Anemia panel from 7/6 reveals a folate level of 4.5- will start replacing - low Iron saturation noted as well- cont Iron  Seizure disorder - cont Vimpat and Keppra   Type 2 diabetes mellitus without complication - cont SSI- A1c was 6.1 on 12/12/18- he was on Lantus  & Metformin  - he currently is not requiring Lantus as sugars in hospital are not elevated enough  Nicotine abuse - taper nicotine patch  Obesity Body mass index is 39.08 kg/m.  Abnormal serum and protein electrophoresis --Dr. Sarajane Jews discussed this with Dr. Alvy Bimler; kappa and lambda chains were appropriately elevated in the context of renal dysfunction.  There was no M spike.  These tests are unremarkable and no further evaluation is suggested.  Time spent in minutes: 35    DVT prophylaxis: SCDs Code Status: Full code Family Communication: Wife  Disposition Plan: SNF Consultants:    Procedures:   PEG tube  2 d ECHO  1. The left ventricle has normal systolic function with an ejection fraction of 60-65%. The cavity size was normal. There is mildly increased left  ventricular wall thickness. Left ventricular diastolic Doppler parameters are consistent with impaired  relaxation. No evidence of left ventricular regional wall motion abnormalities.  2. The right ventricle has normal systolic function. The cavity was normal. There is no increase in right ventricular wall thickness.  3. Trivial pericardial effusion is present.  4. Mild calcification of the mitral valve leaflet. There is mild mitral annular calcification present. No evidence of mitral valve stenosis. No significant mitral regurgitation.  5. The aortic valve is tricuspid. Moderate calcification of the aortic valve. No stenosis of the aortic valve. In the parasternal long axis view, there appears to be a small mobile mass on the atrial size of the aortic valve, possible vegetation. Poor  images on this study, cannot see aortic valve well in other views. Would suggest TEE to evaluate more closely.  6. The aortic root is normal in size and structure.  7. IVC was normal in size. PA systolic pressure 35 mmHg.  8. Technically difficult study with poor acoustic windows.  Antimicrobials:  Anti-infectives (From admission, onward)   Start     Dose/Rate Route Frequency Ordered Stop   01/09/19 1230  ceFAZolin (ANCEF) IVPB 2g/100 mL premix     2 g 200 mL/hr over 30 Minutes Intravenous To Radiology 01/09/19 1215 01/09/19 1302   01/07/19 0000  ceFAZolin (ANCEF) IVPB 2g/100 mL premix     2 g 200 mL/hr over 30 Minutes Intravenous To Radiology 01/04/19 1336 01/07/19 0015   12/12/18 2100  cefTRIAXone (ROCEPHIN) 1 g in sodium chloride 0.9 % 100 mL IVPB  Status:  Discontinued     1 g 200 mL/hr over 30 Minutes Intravenous Every 24 hours 12/12/18 2052 12/15/18 1932       Objective: Vitals:   01/16/19 2036 01/17/19 0039 01/17/19 0401 01/17/19 0818  BP: 128/70 (!) 140/59 (!) 147/55 (!) 155/62  Pulse: (!) 109 (!) 103 (!) 107 100  Resp: 18 20 19  (!) 22  Temp: 99.4 F (37.4 C) 99.4 F (37.4 C) 99.5 F (37.5  C) 99.4 F (37.4 C)  TempSrc: Oral Oral Oral Oral  SpO2: 99% 96% 99% 98%  Weight:        Intake/Output Summary (Last 24 hours) at 01/17/2019 0903 Last data filed at 01/16/2019 1557 Gross per 24 hour  Intake 520.15 ml  Output -  Net 520.15 ml   Filed Weights   01/11/19 0436 01/12/19 0500 01/15/19 0500  Weight: 100.2 kg 120.2 kg 116.6 kg    Examination: General exam: Appears comfortable  HEENT: PERRLA, oral mucosa moist, no sclera icterus or thrush Respiratory system: Clear to auscultation. Respiratory effort normal. Cardiovascular system: S1 & S2 heard,  No murmurs  Gastrointestinal system: Abdomen soft, non-tender, nondistended. Normal bowel sounds   Central nervous system: Alert and oriented to person- weak on right side.  Extremities: No cyanosis, clubbing or edema Skin: No rashes or ulcers Psychiatry:  Mood & affect appropriate.     Data Reviewed: I have personally reviewed following labs and imaging studies  CBC: Recent Labs  Lab  01/16/19 0428 01/17/19 0430  WBC 14.5* 14.0*  NEUTROABS 10.8*  --   HGB 8.4* 7.7*  HCT 28.4* 26.3*  MCV 78.2* 77.6*  PLT 399 643   Basic Metabolic Panel: Recent Labs  Lab 01/15/19 1039 01/16/19 0428 01/17/19 0430  NA  --  137 135  K  --  3.9 3.9  CL  --  101 102  CO2  --  23 26  GLUCOSE  --  180* 191*  BUN  --  17 16  CREATININE 0.61 0.62 0.73  CALCIUM  --  9.3 8.8*   GFR: Estimated Creatinine Clearance: 103.5 mL/min (by C-G formula based on SCr of 0.73 mg/dL). Liver Function Tests: No results for input(s): AST, ALT, ALKPHOS, BILITOT, PROT, ALBUMIN in the last 168 hours. No results for input(s): LIPASE, AMYLASE in the last 168 hours. No results for input(s): AMMONIA in the last 168 hours. Coagulation Profile: No results for input(s): INR, PROTIME in the last 168 hours. Cardiac Enzymes: No results for input(s): CKTOTAL, CKMB, CKMBINDEX, TROPONINI in the last 168 hours. BNP (last 3 results) No results for input(s):  PROBNP in the last 8760 hours. HbA1C: No results for input(s): HGBA1C in the last 72 hours. CBG: Recent Labs  Lab 01/16/19 1631 01/16/19 2033 01/17/19 0041 01/17/19 0402 01/17/19 0814  GLUCAP 166* 164* 173* 166* 196*   Lipid Profile: No results for input(s): CHOL, HDL, LDLCALC, TRIG, CHOLHDL, LDLDIRECT in the last 72 hours. Thyroid Function Tests: No results for input(s): TSH, T4TOTAL, FREET4, T3FREE, THYROIDAB in the last 72 hours. Anemia Panel: No results for input(s): VITAMINB12, FOLATE, FERRITIN, TIBC, IRON, RETICCTPCT in the last 72 hours. Urine analysis:    Component Value Date/Time   COLORURINE YELLOW 01/17/2019 0015   APPEARANCEUR HAZY (A) 01/17/2019 0015   LABSPEC 1.019 01/17/2019 0015   PHURINE 7.0 01/17/2019 0015   GLUCOSEU NEGATIVE 01/17/2019 0015   HGBUR NEGATIVE 01/17/2019 0015   BILIRUBINUR NEGATIVE 01/17/2019 0015   KETONESUR NEGATIVE 01/17/2019 0015   PROTEINUR NEGATIVE 01/17/2019 0015   NITRITE POSITIVE (A) 01/17/2019 0015   LEUKOCYTESUR MODERATE (A) 01/17/2019 0015   Sepsis Labs: @LABRCNTIP (procalcitonin:4,lacticidven:4) ) Recent Results (from the past 240 hour(s))  SARS Coronavirus 2 [facility] order, Performed in [place] hospital lab)     Status: None   Collection Time: 01/14/19 11:20 AM  Result Value Ref Range Status   SARS Coronavirus 2 NEGATIVE NEGATIVE Final    Comment: (NOTE) If result is NEGATIVE SARS-CoV-2 target nucleic acids are NOT DETECTED. The SARS-CoV-2 RNA is generally detectable in upper and lower  respiratory specimens during the acute phase of infection. The lowest  concentration of SARS-CoV-2 viral copies this assay can detect is 250  copies / mL. A negative result does not preclude SARS-CoV-2 infection  and should not be used as the sole basis for treatment or other  patient management decisions.  A negative result may occur with  improper specimen collection / handling, submission of specimen other  than nasopharyngeal  swab, presence of viral mutation(s) within the  areas targeted by this assay, and inadequate number of viral copies  (<250 copies / mL). A negative result must be combined with clinical  observations, patient history, and epidemiological information. If result is POSITIVE SARS-CoV-2 target nucleic acids are DETECTED. The SARS-CoV-2 RNA is generally detectable in upper and lower  respiratory specimens dur ing the acute phase of infection.  Positive  results are indicative of active infection with SARS-CoV-2.  Clinical  correlation with patient history  and other diagnostic information is  necessary to determine patient infection status.  Positive results do  not rule out bacterial infection or co-infection with other viruses. If result is PRESUMPTIVE POSTIVE SARS-CoV-2 nucleic acids MAY BE PRESENT.   A presumptive positive result was obtained on the submitted specimen  and confirmed on repeat testing.  While 2019 novel coronavirus  (SARS-CoV-2) nucleic acids may be present in the submitted sample  additional confirmatory testing may be necessary for epidemiological  and / or clinical management purposes  to differentiate between  SARS-CoV-2 and other Sarbecovirus currently known to infect humans.  If clinically indicated additional testing with an alternate test  methodology [phone]) is advised. The SARS-CoV-2 RNA is generally  detectable in upper and lower respiratory sp ecimens during the acute  phase of infection. The expected result is Negative. Fact Sheet for Patients:  StrictlyIdeas.no Fact Sheet for Healthcare Providers: BankingDealers.co.za This test is not yet approved or cleared by the [place] FDA and has been authorized for detection and/or diagnosis of SARS-CoV-2 by FDA under an Emergency Use Authorization (EUA).  This EUA will remain in effect (meaning this test can be used) for the duration of the COVID-19 declaration  under Section 564(b)(1) of the Act, 21 U.S.C. section 360bbb-3(b)(1), unless the authorization is terminated or revoked sooner. Performed at [facility], [place] [address] [place], [place] [address]          Radiology Studies: Dg Chest Port 1 View  Result Date: 01/16/2019 CLINICAL DATA:  Fever EXAM: PORTABLE CHEST 1 VIEW COMPARISON:  12/29/2018 FINDINGS: Cardiomediastinal contours are stable. Calcific aortic knob. Linear right basilar atelectasis. No new focal airspace consolidation. No large pleural fluid collection. No pneumothorax. IMPRESSION: Mild right basilar atelectasis. Electronically Signed   By: Davina Poke M.D.   On: 01/16/2019 08:27      Scheduled Meds: . amLODipine  10 mg Per Tube Daily  . aspirin  325 mg Per Tube Daily  . chlorhexidine  15 mL Mouth Rinse BID  . dorzolamide  1 drop Both Eyes BID  . feeding supplement (PRO-STAT SUGAR FREE 64)  30 mL Per Tube Daily  . ferrous sulfate  300 mg Per Tube BID WC  . hydrALAZINE  100 mg Per Tube BID  . insulin aspart  0-9 Units Subcutaneous Q4H  . lacosamide  50 mg Per Tube BID  . levETIRAcetam  500 mg Per Tube BID  . lisinopril  20 mg Per Tube Daily  . mouth rinse  15 mL Mouth Rinse q12n4p  . metoprolol tartrate  25 mg Per Tube BID  . nicotine  14 mg Transdermal Daily  . OLANZapine  5 mg Per Tube QHS  . polyethylene glycol  17 g Per Tube BID  . senna  1 tablet Per Tube QHS  . sodium chloride flush  10-40 mL Intracatheter Q12H   Continuous Infusions: . feeding supplement (JEVITY 1.5 CAL/FIBER) 1,000 mL (01/16/19 1733)     LOS: 36 days      Debbe Odea, MD [facility] Pager: [email or web address] Password [email or web address] 01/17/2019, 9:03 AM

## 2019-01-18 LAB — GLUCOSE, CAPILLARY
Glucose-Capillary: 172 mg/dL — ABNORMAL HIGH (ref 70–99)
Glucose-Capillary: 177 mg/dL — ABNORMAL HIGH (ref 70–99)
Glucose-Capillary: 192 mg/dL — ABNORMAL HIGH (ref 70–99)
Glucose-Capillary: 194 mg/dL — ABNORMAL HIGH (ref 70–99)
Glucose-Capillary: 221 mg/dL — ABNORMAL HIGH (ref 70–99)

## 2019-01-18 LAB — CBC
HCT: 26.6 % — ABNORMAL LOW (ref 39.0–52.0)
Hemoglobin: 7.9 g/dL — ABNORMAL LOW (ref 13.0–17.0)
MCH: 22.9 pg — ABNORMAL LOW (ref 26.0–34.0)
MCHC: 29.7 g/dL — ABNORMAL LOW (ref 30.0–36.0)
MCV: 77.1 fL — ABNORMAL LOW (ref 80.0–100.0)
Platelets: 477 10*3/uL — ABNORMAL HIGH (ref 150–400)
RBC: 3.45 MIL/uL — ABNORMAL LOW (ref 4.22–5.81)
RDW: 16.5 % — ABNORMAL HIGH (ref 11.5–15.5)
WBC: 15.6 10*3/uL — ABNORMAL HIGH (ref 4.0–10.5)
nRBC: 0 % (ref 0.0–0.2)

## 2019-01-18 MED ORDER — SILODOSIN 8 MG PO CAPS
8.0000 mg | ORAL_CAPSULE | Freq: Every day | ORAL | Status: DC
  Administered 2019-01-18 – 2019-01-24 (×7): 8 mg via ORAL
  Filled 2019-01-18 (×7): qty 1

## 2019-01-18 MED ORDER — SODIUM CHLORIDE 0.9 % IV SOLN
1.0000 g | INTRAVENOUS | Status: DC
  Administered 2019-01-18 – 2019-01-20 (×3): 1 g via INTRAVENOUS
  Filled 2019-01-18 (×3): qty 10

## 2019-01-18 NOTE — Progress Notes (Signed)
Nutrition Follow-up  DOCUMENTATION CODES:   Obesity unspecified  INTERVENTION:  ContinueJevity 1.109frmula via PEG at goal rate of531mhr.  Continue3030mrostat(or equivalent)once daily.   Continue free water flushes of 200 ml every 8 hours.   Tube feeding regimen provides1900kcal (100% of needs),90grams of protein, and 1512m95m H2O.  NUTRITION DIAGNOSIS:   Inadequate oral intake related to inability to eat as evidenced by NPO status; ongoing  GOAL:   Patient will meet greater than or equal to 90% of their needs; met with TF  MONITOR:   TF tolerance, Weight trends, Labs, I & O's, Skin  REASON FOR ASSESSMENT:   NPO/Clear Liquid Diet, Low Braden    ASSESSMENT:   73 y35r old male with a history of previous stroke, hypertension, diabetes, seizure disorder, dementia who is a resident of a skilled nursing facility, was brought to the hospital with worsening mental status.  Found to be significantly dehydrated, hypernatremic, with acute kidney injury. PEG placed 7/28.   Pt continues on NPO status. Pt has been tolerating his tube feedings well. RD to continue with current tube feeding orders. Plans to discharge to SNF.   Labs and medications reviewed.   Diet Order:   Diet Order            Diet - low sodium heart healthy        Diet NPO time specified  Diet effective midnight              EDUCATION NEEDS:   Not appropriate for education at this time  Skin:  Skin Assessment: Skin Integrity Issues: Skin Integrity Issues:: Stage II, DTI DTI: R heel Stage II: L buttocks  Last BM:  8/6  Height:   Ht Readings from Last 1 Encounters:  12/11/18 '5\' 8"'  (1.727 m)    Weight:   Wt Readings from Last 1 Encounters:  01/18/19 114.3 kg    Ideal Body Weight:  70 kg  BMI:  Body mass index is 38.32 kg/m.  Estimated Nutritional Needs:   Kcal:  1900-2100  Protein:  90-110 grams  Fluid:  1.9 - 2.1 L/day    StepCorrin Parker, RD, LDN Pager  # [phone] hours/ weekend pager # [phone]

## 2019-01-18 NOTE — TOC Progression Note (Signed)
Transition of Care [facility] - Progression Note    Patient Details  Name: Julian Tyler MRN: [id number] Date of Birth: 12-07-1945  Transition of Care [facility]) CM/SW [place], [place] Phone Number: 01/18/2019, 11:54 AM  Clinical Narrative:    Family to fill out paperwork this afternoon at [facility]; per admissions Cheri they can take pt over the weekend.    Expected Discharge Plan: [place] Barriers to Discharge: Continued Medical Work up, Ship broker  Expected Discharge Plan and Services Expected Discharge Plan: [place] Choice: [place] Living arrangements for the past 2 months: [place] Expected Discharge Date: 01/10/19                 Social Determinants of Health (SDOH) Interventions    Readmission Risk Interventions No flowsheet data found.

## 2019-01-18 NOTE — Progress Notes (Signed)
PROGRESS NOTE    Julian Tyler   KVQ:[id number]  DOB: 03/21/1946  DOA: 12/11/2018 PCP: Milana Na., MD   Brief Narrative:  Julian Tyler is a 73 y/o with HTN, HLD, DM 2, CVA with residual right sided weakness, seizure disorder, dementia who presents as a code stroke for left facial droop and left arm weakness.  MRI negative for CVA.  Admitted for ongoing confusion, agitation, sodium 158, dehydration and AKI. He recently had a hospital admission Due to ongoing inability to swallow, he has received a PEG tube. His agitation/ delirium was severe enough to require restrains and adjustment of medications.   Subjective: He has no complaints today.   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 -HIVnon-reactive - he has a history ofdementia and mental status changes may be relate to progression of his dementia - apparently he was recently started on Seroquel, Xanax and Depakote for combativeness in relation to his dementia - Haldol PRN and Olanzapine QHSwere being use in the hospital for behavioral disturbances - Haldol PRN discontinued to the being stable with Olanzapine alone - he needs to total care with ADLs including feeding   Active Problems:    Severe protein-calorie malnutrition/ dehydration, AKI and hypernatremia -The patient failed a swallow eval in the hospital and the family initially requested a core track -He was hydrated with IVF for dehydration - now has PEG tube and has been tolerating tube feeds (Jevity) - cont Prostat  Recent fever, leukocytosis- ? Urinary retention - temp 101.8 on 8/3, now 99 - WBC is 14.5 - CXR unrevealing- no cough, dyspnea  or hypoxia -UA noted to be + -  Culture sent yesterday- started Ceftriaxone  Hypotension with h/o HN - episode of hypotension 8/5- BP 111/53 which was significantly below his normal - patient was very groggy as well- he had not received any sedatives today and was though to be due to hypotension- given a 500 cc bolus with improvement in mental status and BP - cont Metoprolol, Hydralazine, Amlodipine and Lisinopril- monitoring closely for recurrent hypotension- it has not recurred  H/o CVA - cont ASA  Abnormal echocardiogram with possible mobile density on aortic valve -Blood cultures have remained negative - The patient's son does not want to pursue any further work-up  Anemia- folate deficiency and Iron deficiency - Anemia panel from 7/6 reveals a folate level of 4.5-  started replacing - low Iron saturation noted as well- cont Iron  Seizure disorder - cont Vimpat and Keppra   Type 2 diabetes mellitus without complication - cont SSI- A1c was 6.1 on 12/12/18- he was on Lantus  & Metformin  - he currently is not requiring Lantus as sugars in hospital are not elevated enough  Nicotine abuse - taper nicotine patch  Obesity Body mass index is 38.32 kg/m.  Abnormal serum and protein electrophoresis --Dr. Sarajane Jews discussed this with Dr. Alvy Bimler; kappa and lambda chains were appropriately elevated in the context of renal dysfunction.  There was no M spike.  These tests are unremarkable and no further evaluation is suggested.  Time spent in minutes: 35    DVT prophylaxis: SCDs Code Status: Full code  Family Communication: Wife  Disposition Plan: SNF Consultants:   Neuro   IR Procedures:   PEG tube  2 d ECHO  1. The left ventricle has normal systolic function with an ejection fraction of 60-65%. The cavity size was normal. There is mildly increased left ventricular wall thickness. Left ventricular diastolic Doppler parameters are consistent with impaired  relaxation. No  evidence of left ventricular regional wall motion abnormalities.  2. The right ventricle has normal systolic function. The cavity was normal. There is no increase in right ventricular wall thickness.  3. Trivial pericardial effusion is present.  4. Mild calcification of the mitral valve leaflet. There is mild mitral annular calcification present. No evidence of mitral valve stenosis. No significant mitral regurgitation.  5. The aortic valve is tricuspid. Moderate calcification of the aortic valve. No stenosis of the aortic valve. In the parasternal long axis view, there appears to be a small mobile mass on the atrial size of the aortic valve, possible vegetation. Poor  images on this study, cannot see aortic valve well in other views. Would suggest TEE to evaluate more closely.  6. The aortic root is normal in size and structure.  7. IVC was normal in size. PA systolic pressure 35 mmHg.  8. Technically difficult study with poor acoustic windows.  Antimicrobials:  Anti-infectives (From admission, onward)   Start     Dose/Rate Route Frequency Ordered Stop   01/18/19 0800  cefTRIAXone (ROCEPHIN) 1 g in sodium chloride 0.9 % 100 mL IVPB     1 g 200 mL/hr over 30 Minutes Intravenous Every 24 hours 01/18/19 0717     01/09/19 1230  ceFAZolin (ANCEF) IVPB 2g/100 mL premix     2 g 200 mL/hr over 30 Minutes Intravenous To Radiology 01/09/19 1215 01/09/19 1302   01/07/19 0000  ceFAZolin (ANCEF) IVPB 2g/100 mL premix     2 g 200 mL/hr over 30 Minutes Intravenous To Radiology 01/04/19 1336 01/07/19 0015   12/12/18 2100  cefTRIAXone (ROCEPHIN) 1 g in sodium chloride 0.9 % 100 mL IVPB  Status:  Discontinued     1 g 200 mL/hr over 30 Minutes Intravenous Every 24 hours 12/12/18 2052 12/15/18 1932       Objective: Vitals:   01/18/19 0018 01/18/19 0322 01/18/19 0500 01/18/19 0913  BP: (!) 165/70 (!) 177/74  (!) 151/63  Pulse: (!) 101 (!) 106  100  Resp: (!) 21 19  (!) 24  Temp: 99.5 F (37.5 C)  98.6 F (37 C)  100.2 F (37.9 C)  TempSrc: Oral Oral  Oral  SpO2: 99% 100%  98%  Weight:   114.3 kg     Intake/Output Summary (Last 24 hours) at 01/18/2019 1141 Last data filed at 01/18/2019 0322 Gross per 24 hour  Intake -  Output 825 ml  Net -825 ml   Filed Weights   01/12/19 0500 01/15/19 0500 01/18/19 0500  Weight: 120.2 kg 116.6 kg 114.3 kg    Examination: General exam: Appears comfortable  HEENT: PERRLA, oral mucosa moist, no sclera icterus or thrush Respiratory system: Clear to auscultation. Respiratory effort normal. Cardiovascular system: S1 & S2 heard,  No murmurs  Gastrointestinal system: Abdomen soft, non-tender, nondistended. Normal bowel sounds   Central nervous system: Alert and oriented to person- right sided weakness Extremities: No cyanosis, clubbing or edema Skin: No rashes or ulcers Psychiatry:  Mood & affect appropriate.     Data Reviewed: I have personally reviewed following labs and imaging studies  CBC: Recent  Labs  Lab 01/16/19 0428 01/17/19 0430 01/18/19 0327  WBC 14.5* 14.0* 15.6*  NEUTROABS 10.8*  --   --   HGB 8.4* 7.7* 7.9*  HCT 28.4* 26.3* 26.6*  MCV 78.2* 77.6* 77.1*  PLT 399 390 993*   Basic Metabolic Panel: Recent Labs  Lab 01/15/19 1039 01/16/19 0428 01/17/19 0430  NA  --  137 135  K  --  3.9 3.9  CL  --  101 102  CO2  --  23 26  GLUCOSE  --  180* 191*  BUN  --  17 16  CREATININE 0.61 0.62 0.73  CALCIUM  --  9.3 8.8*   GFR: Estimated Creatinine Clearance: 102.5 mL/min (by C-G formula based on SCr of 0.73 mg/dL). Liver Function Tests: No results for input(s): AST, ALT, ALKPHOS, BILITOT, PROT, ALBUMIN in the last 168 hours. No results for input(s): LIPASE, AMYLASE in the last 168 hours. No results for input(s): AMMONIA in the last 168 hours. Coagulation Profile: No results for input(s): INR, PROTIME in the last 168 hours. Cardiac Enzymes: No results for input(s): CKTOTAL, CKMB, CKMBINDEX, TROPONINI in the last 168  hours. BNP (last 3 results) No results for input(s): PROBNP in the last 8760 hours. HbA1C: No results for input(s): HGBA1C in the last 72 hours. CBG: Recent Labs  Lab 01/17/19 1630 01/17/19 2016 01/18/19 0018 01/18/19 0318 01/18/19 0749  GLUCAP 181* 234* 172* 194* 177*   Lipid Profile: No results for input(s): CHOL, HDL, LDLCALC, TRIG, CHOLHDL, LDLDIRECT in the last 72 hours. Thyroid Function Tests: No results for input(s): TSH, T4TOTAL, FREET4, T3FREE, THYROIDAB in the last 72 hours. Anemia Panel: No results for input(s): VITAMINB12, FOLATE, FERRITIN, TIBC, IRON, RETICCTPCT in the last 72 hours. Urine analysis:    Component Value Date/Time   COLORURINE YELLOW 01/17/2019 0015   APPEARANCEUR HAZY (A) 01/17/2019 0015   LABSPEC 1.019 01/17/2019 0015   PHURINE 7.0 01/17/2019 0015   GLUCOSEU NEGATIVE 01/17/2019 0015   HGBUR NEGATIVE 01/17/2019 0015   BILIRUBINUR NEGATIVE 01/17/2019 0015   KETONESUR NEGATIVE 01/17/2019 0015   PROTEINUR NEGATIVE 01/17/2019 0015   NITRITE POSITIVE (A) 01/17/2019 0015   LEUKOCYTESUR MODERATE (A) 01/17/2019 0015   Sepsis Labs: @LABRCNTIP (procalcitonin:4,lacticidven:4) ) Recent Results (from the past 240 hour(s))  SARS Coronavirus 2 [facility] order, Performed in [place] hospital lab)     Status: None   Collection Time: 01/14/19 11:20 AM  Result Value Ref Range Status   SARS Coronavirus 2 NEGATIVE NEGATIVE Final    Comment: (NOTE) If result is NEGATIVE SARS-CoV-2 target nucleic acids are NOT DETECTED. The SARS-CoV-2 RNA is generally detectable in upper and lower  respiratory specimens during the acute phase of infection. The lowest  concentration of SARS-CoV-2 viral copies this assay can detect is 250  copies / mL. A negative result does not preclude SARS-CoV-2 infection  and should not be used as the sole basis for treatment or other  patient management decisions.  A negative result may occur with  improper specimen collection /  handling, submission of specimen other  than nasopharyngeal swab, presence of viral mutation(s) within the  areas targeted by this assay, and inadequate number of viral copies  (<250 copies / mL). A negative result must be combined with clinical  observations, patient history, and epidemiological information. If result is POSITIVE SARS-CoV-2 target nucleic acids are DETECTED. The SARS-CoV-2 RNA is generally detectable in upper and lower  respiratory specimens dur ing the acute phase of infection.  Positive  results are  indicative of active infection with SARS-CoV-2.  Clinical  correlation with patient history and other diagnostic information is  necessary to determine patient infection status.  Positive results do  not rule out bacterial infection or co-infection with other viruses. If result is PRESUMPTIVE POSTIVE SARS-CoV-2 nucleic acids MAY BE PRESENT.   A presumptive positive result was obtained on the submitted specimen  and confirmed on repeat testing.  While 2019 novel coronavirus  (SARS-CoV-2) nucleic acids may be present in the submitted sample  additional confirmatory testing may be necessary for epidemiological  and / or clinical management purposes  to differentiate between  SARS-CoV-2 and other Sarbecovirus currently known to infect humans.  If clinically indicated additional testing with an alternate test  methodology [phone]) is advised. The SARS-CoV-2 RNA is generally  detectable in upper and lower respiratory sp ecimens during the acute  phase of infection. The expected result is Negative. Fact Sheet for Patients:  StrictlyIdeas.no Fact Sheet for Healthcare Providers: BankingDealers.co.za This test is not yet approved or cleared by the [place] FDA and has been authorized for detection and/or diagnosis of SARS-CoV-2 by FDA under an Emergency Use Authorization (EUA).  This EUA will remain in effect (meaning this  test can be used) for the duration of the COVID-19 declaration under Section 564(b)(1) of the Act, 21 U.S.C. section 360bbb-3(b)(1), unless the authorization is terminated or revoked sooner. Performed at [facility], [place] [address], [place], [place] [address]          Radiology Studies: No results found.    Scheduled Meds: . amLODipine  10 mg Per Tube Daily  . aspirin  325 mg Per Tube Daily  . chlorhexidine  15 mL Mouth Rinse BID  . dorzolamide  1 drop Both Eyes BID  . feeding supplement (PRO-STAT SUGAR FREE 64)  30 mL Per Tube Daily  . ferrous sulfate  300 mg Per Tube BID WC  . folic acid  2 mg Per Tube Daily  . free water  200 mL Per Tube Q8H  . hydrALAZINE  100 mg Per Tube BID  . insulin aspart  0-9 Units Subcutaneous Q4H  . lacosamide  50 mg Per Tube BID  . levETIRAcetam  500 mg Per Tube BID  . lisinopril  20 mg Per Tube Daily  . mouth rinse  15 mL Mouth Rinse q12n4p  . metoprolol tartrate  25 mg Per Tube BID  . nicotine  14 mg Transdermal Daily  . OLANZapine  5 mg Per Tube QHS  . polyethylene glycol  17 g Per Tube BID  . senna  1 tablet Per Tube QHS  . sodium chloride flush  10-40 mL Intracatheter Q12H   Continuous Infusions: . cefTRIAXone (ROCEPHIN)  IV 1 g (01/18/19 1113)  . feeding supplement (JEVITY 1.5 CAL/FIBER) 1,000 mL (01/17/19 1605)     LOS: 37 days      Debbe Odea, MD [facility] Pager: [email or web address] Password [email or web address] 01/18/2019, 11:41 AM

## 2019-01-19 LAB — CBC
HCT: 24.7 % — ABNORMAL LOW (ref 39.0–52.0)
HCT: 25.3 % — ABNORMAL LOW (ref 39.0–52.0)
Hemoglobin: 7.3 g/dL — ABNORMAL LOW (ref 13.0–17.0)
Hemoglobin: 7.6 g/dL — ABNORMAL LOW (ref 13.0–17.0)
MCH: 23.1 pg — ABNORMAL LOW (ref 26.0–34.0)
MCH: 23.2 pg — ABNORMAL LOW (ref 26.0–34.0)
MCHC: 29.6 g/dL — ABNORMAL LOW (ref 30.0–36.0)
MCHC: 30 g/dL (ref 30.0–36.0)
MCV: 78.2 fL — ABNORMAL LOW (ref 80.0–100.0)
MCV: 77.4 fL — ABNORMAL LOW (ref 80.0–100.0)
Platelets: 444 10*3/uL — ABNORMAL HIGH (ref 150–400)
Platelets: 462 10*3/uL — ABNORMAL HIGH (ref 150–400)
RBC: 3.16 MIL/uL — ABNORMAL LOW (ref 4.22–5.81)
RBC: 3.27 MIL/uL — ABNORMAL LOW (ref 4.22–5.81)
RDW: 16.5 % — ABNORMAL HIGH (ref 11.5–15.5)
RDW: 16.5 % — ABNORMAL HIGH (ref 11.5–15.5)
WBC: 14 10*3/uL — ABNORMAL HIGH (ref 4.0–10.5)
WBC: 14.8 10*3/uL — ABNORMAL HIGH (ref 4.0–10.5)
nRBC: 0 % (ref 0.0–0.2)
nRBC: 0 % (ref 0.0–0.2)

## 2019-01-19 LAB — GLUCOSE, CAPILLARY
Glucose-Capillary: 182 mg/dL — ABNORMAL HIGH (ref 70–99)
Glucose-Capillary: 185 mg/dL — ABNORMAL HIGH (ref 70–99)
Glucose-Capillary: 185 mg/dL — ABNORMAL HIGH (ref 70–99)
Glucose-Capillary: 191 mg/dL — ABNORMAL HIGH (ref 70–99)
Glucose-Capillary: 194 mg/dL — ABNORMAL HIGH (ref 70–99)
Glucose-Capillary: 195 mg/dL — ABNORMAL HIGH (ref 70–99)

## 2019-01-19 LAB — SARS CORONAVIRUS 2 (TAT 6-24 HRS): SARS Coronavirus 2: NEGATIVE

## 2019-01-19 NOTE — Progress Notes (Signed)
PROGRESS NOTE    Julian Tyler   GUR:[id number]  DOB: July 11, 1945  DOA: 12/11/2018 PCP: Milana Na., MD   Brief Narrative:  Julian Tyler is a 73 y/o with HTN, HLD, DM 2, CVA with residual right sided weakness, seizure disorder, dementia who presents as a code stroke for left facial droop and left arm weakness.  MRI negative for CVA.  Admitted for ongoing confusion, agitation, sodium 158, dehydration and AKI. He recently had a hospital admission Due to ongoing inability to swallow, he has received a PEG tube. His agitation/ delirium was severe enough to require restrains and adjustment of medications.   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 -HIVnon-reactive - he has a history ofdementia and mental status changes may be relate to progression of his dementia - apparently he was recently started on Seroquel, Xanax and Depakote for combativeness in relation to his dementia - Haldol PRN and Olanzapine QHSwere being use in the hospital for behavioral disturbances - Haldol PRN discontinued to the being stable with Olanzapine alone - he needs to total care with ADLs including feeding   Active Problems:    Severe protein-calorie malnutrition/ dehydration, AKI and hypernatremia -The patient failed a swallow eval in the hospital and the family initially requested a core track -He was hydrated with IVF for dehydration - now has PEG tube and has been tolerating tube feeds (Jevity) - cont Prostat  Recent fever, leukocytosis- ? Urinary retention - temp 101.8 on 8/3, now 99 - WBC is 14.5 - CXR unrevealing- no cough,  dyspnea or hypoxia -UA noted to be + -  Culture sent - started Ceftriaxone- urine culture is growing greater than 100,000 gram-negative rods -His WBC count is not improving on the current antibiotic he had a low-grade temperature again of 99.6 - We will continue to follow in the hospital until improvement is noted and until cultures have been finalized  Hypotension with h/o HTN - episode of hypotension 8/5- BP 111/53 which was significantly below his normal - patient was very groggy as well- he had not received any sedatives today and was though to be due to hypotension- given a 500 cc bolus with improvement in mental status and BP - cont Metoprolol, Hydralazine, Amlodipine and Lisinopril- monitoring closely for recurrent hypotension- it has not recurred  H/o CVA - cont ASA  Abnormal echocardiogram with possible mobile density on aortic valve -Blood cultures have remained negative - The patient's son does not want to pursue any further work-up  Anemia- folate deficiency and Iron deficiency - Anemia panel from 7/6 reveals a folate level of 4.5-  started replacing - low Iron saturation noted as well- cont Iron -His hemoglobin has dropped slightly  Seizure disorder - cont Vimpat and Keppra   Type 2 diabetes mellitus without complication - cont SSI- A1c was 6.1 on 12/12/18- he was on Lantus  & Metformin  - he currently is not requiring Lantus as sugars in hospital are not elevated enough  Nicotine abuse - taper nicotine patch  Obesity Body mass index is 38.25 kg/m.  Abnormal serum and protein electrophoresis --Dr.  Sarajane Jews discussed this with Dr. Alvy Bimler; kappa and lambda chains were appropriately elevated in the context of renal dysfunction.  There was no M spike.  These tests are unremarkable and no further evaluation is suggested.  Time spent in minutes: 35    DVT prophylaxis: SCDs Code Status: Full code Family Communication: Wife  Disposition Plan: SNF Consultants:   Neuro    IR Procedures:   PEG tube  2 d ECHO  1. The left ventricle has normal systolic function with an ejection fraction of 60-65%. The cavity size was normal. There is mildly increased left ventricular wall thickness. Left ventricular diastolic Doppler parameters are consistent with impaired  relaxation. No evidence of left ventricular regional wall motion abnormalities.  2. The right ventricle has normal systolic function. The cavity was normal. There is no increase in right ventricular wall thickness.  3. Trivial pericardial effusion is present.  4. Mild calcification of the mitral valve leaflet. There is mild mitral annular calcification present. No evidence of mitral valve stenosis. No significant mitral regurgitation.  5. The aortic valve is tricuspid. Moderate calcification of the aortic valve. No stenosis of the aortic valve. In the parasternal long axis view, there appears to be a small mobile mass on the atrial size of the aortic valve, possible vegetation. Poor  images on this study, cannot see aortic valve well in other views. Would suggest TEE to evaluate more closely.  6. The aortic root is normal in size and structure.  7. IVC was normal in size. PA systolic pressure 35 mmHg.  8. Technically difficult study with poor acoustic windows.  Antimicrobials:  Anti-infectives (From admission, onward)   Start     Dose/Rate Route Frequency Ordered Stop   01/18/19 0800  cefTRIAXone (ROCEPHIN) 1 g in sodium chloride 0.9 % 100 mL IVPB     1 g 200 mL/hr over 30 Minutes Intravenous Every 24 hours 01/18/19 0717     01/09/19 1230  ceFAZolin (ANCEF) IVPB 2g/100 mL premix     2 g 200 mL/hr over 30 Minutes Intravenous To Radiology 01/09/19 1215 01/09/19 1302   01/07/19 0000  ceFAZolin (ANCEF) IVPB 2g/100 mL premix     2 g 200 mL/hr over 30 Minutes Intravenous To Radiology 01/04/19 1336 01/07/19 0015   12/12/18 2100  cefTRIAXone (ROCEPHIN) 1 g in sodium chloride 0.9 % 100 mL IVPB  Status:   Discontinued     1 g 200 mL/hr over 30 Minutes Intravenous Every 24 hours 12/12/18 2052 12/15/18 1932       Objective: Vitals:   01/19/19 0130 01/19/19 0400 01/19/19 0751 01/19/19 1204  BP:  (!) 152/68 (!) 150/62 136/71  Pulse:  93 (!) 105 92  Resp:  20 (!) 22 20  Temp:   98.6 F (37 C) 99.6 F (37.6 C)  TempSrc:   Oral Oral  SpO2:  99% 98% 95%  Weight: 114.1 kg       Intake/Output Summary (Last 24 hours) at 01/19/2019 1251 Last data filed at 01/19/2019 0830 Gross per 24 hour  Intake 4132 ml  Output 1340 ml  Net 2792 ml   Filed Weights   01/15/19 0500 01/18/19 0500 01/19/19 0130  Weight: 116.6 kg 114.3 kg 114.1 kg    Examination: General exam: Appears comfortable  HEENT: PERRLA, oral mucosa moist, no sclera icterus or thrush Respiratory system: Clear to auscultation. Respiratory effort normal. Cardiovascular system: S1 & S2 heard,  No murmurs  Gastrointestinal system: Abdomen soft, non-tender, nondistended. Normal bowel sounds  Central nervous system: Alert and oriented to person- right sided weakness Extremities: No cyanosis, clubbing or edema Skin: No rashes or ulcers Psychiatry:  flat affect    Data Reviewed: I have personally reviewed following labs and imaging studies  CBC: Recent Labs  Lab 01/16/19 0428 01/17/19 0430 01/18/19 0327 01/19/19 0333 01/19/19 0836  WBC 14.5* 14.0* 15.6* 14.0* 14.8*  NEUTROABS 10.8*  --   --   --   --   HGB 8.4* 7.7* 7.9* 7.3* 7.6*  HCT 28.4* 26.3* 26.6* 24.7* 25.3*  MCV 78.2* 77.6* 77.1* 78.2* 77.4*  PLT 399 390 477* 444* 630*   Basic Metabolic Panel: Recent Labs  Lab 01/15/19 1039 01/16/19 0428 01/17/19 0430  NA  --  137 135  K  --  3.9 3.9  CL  --  101 102  CO2  --  23 26  GLUCOSE  --  180* 191*  BUN  --  17 16  CREATININE 0.61 0.62 0.73  CALCIUM  --  9.3 8.8*   GFR: Estimated Creatinine Clearance: 102.4 mL/min (by C-G formula based on SCr of 0.73 mg/dL). Liver Function Tests: No results for input(s):  AST, ALT, ALKPHOS, BILITOT, PROT, ALBUMIN in the last 168 hours. No results for input(s): LIPASE, AMYLASE in the last 168 hours. No results for input(s): AMMONIA in the last 168 hours. Coagulation Profile: No results for input(s): INR, PROTIME in the last 168 hours. Cardiac Enzymes: No results for input(s): CKTOTAL, CKMB, CKMBINDEX, TROPONINI in the last 168 hours. BNP (last 3 results) No results for input(s): PROBNP in the last 8760 hours. HbA1C: No results for input(s): HGBA1C in the last 72 hours. CBG: Recent Labs  Lab 01/18/19 1702 01/19/19 0000 01/19/19 0403 01/19/19 0736 01/19/19 1159  GLUCAP 192* 195* 194* 185* 185*   Lipid Profile: No results for input(s): CHOL, HDL, LDLCALC, TRIG, CHOLHDL, LDLDIRECT in the last 72 hours. Thyroid Function Tests: No results for input(s): TSH, T4TOTAL, FREET4, T3FREE, THYROIDAB in the last 72 hours. Anemia Panel: No results for input(s): VITAMINB12, FOLATE, FERRITIN, TIBC, IRON, RETICCTPCT in the last 72 hours. Urine analysis:    Component Value Date/Time   COLORURINE YELLOW 01/17/2019 0015   APPEARANCEUR HAZY (A) 01/17/2019 0015   LABSPEC 1.019 01/17/2019 0015   PHURINE 7.0 01/17/2019 0015   GLUCOSEU NEGATIVE 01/17/2019 0015   HGBUR NEGATIVE 01/17/2019 0015   BILIRUBINUR NEGATIVE 01/17/2019 0015   KETONESUR NEGATIVE 01/17/2019 0015   PROTEINUR NEGATIVE 01/17/2019 0015   NITRITE POSITIVE (A) 01/17/2019 0015   LEUKOCYTESUR MODERATE (A) 01/17/2019 0015   Sepsis Labs: @LABRCNTIP (procalcitonin:4,lacticidven:4) ) Recent Results (from the past 240 hour(s))  SARS Coronavirus 2 [facility] order, Performed in [place] hospital lab)     Status: None   Collection Time: 01/14/19 11:20 AM  Result Value Ref Range Status   SARS Coronavirus 2 NEGATIVE NEGATIVE Final    Comment: (NOTE) If result is NEGATIVE SARS-CoV-2 target nucleic acids are NOT DETECTED. The SARS-CoV-2 RNA is generally detectable in upper and lower  respiratory  specimens during the acute phase of infection. The lowest  concentration of SARS-CoV-2 viral copies this assay can detect is 250  copies / mL. A negative result does not preclude SARS-CoV-2 infection  and should not be used as the sole basis for treatment or other  patient management decisions.  A negative result may occur with  improper specimen collection / handling, submission of specimen other  than nasopharyngeal swab, presence of viral mutation(s) within the  areas targeted by  this assay, and inadequate number of viral copies  (<250 copies / mL). A negative result must be combined with clinical  observations, patient history, and epidemiological information. If result is POSITIVE SARS-CoV-2 target nucleic acids are DETECTED. The SARS-CoV-2 RNA is generally detectable in upper and lower  respiratory specimens dur ing the acute phase of infection.  Positive  results are indicative of active infection with SARS-CoV-2.  Clinical  correlation with patient history and other diagnostic information is  necessary to determine patient infection status.  Positive results do  not rule out bacterial infection or co-infection with other viruses. If result is PRESUMPTIVE POSTIVE SARS-CoV-2 nucleic acids MAY BE PRESENT.   A presumptive positive result was obtained on the submitted specimen  and confirmed on repeat testing.  While 2019 novel coronavirus  (SARS-CoV-2) nucleic acids may be present in the submitted sample  additional confirmatory testing may be necessary for epidemiological  and / or clinical management purposes  to differentiate between  SARS-CoV-2 and other Sarbecovirus currently known to infect humans.  If clinically indicated additional testing with an alternate test  methodology [phone]) is advised. The SARS-CoV-2 RNA is generally  detectable in upper and lower respiratory sp ecimens during the acute  phase of infection. The expected result is Negative. Fact Sheet for  Patients:  StrictlyIdeas.no Fact Sheet for Healthcare Providers: BankingDealers.co.za This test is not yet approved or cleared by the [place] FDA and has been authorized for detection and/or diagnosis of SARS-CoV-2 by FDA under an Emergency Use Authorization (EUA).  This EUA will remain in effect (meaning this test can be used) for the duration of the COVID-19 declaration under Section 564(b)(1) of the Act, 21 U.S.C. section 360bbb-3(b)(1), unless the authorization is terminated or revoked sooner. Performed at [facility], [place] [address] [place], [place] [address]   Culture, Urine     Status: Abnormal (Preliminary result)   Collection Time: 01/17/19  3:27 AM   Specimen: Urine, Clean Catch  Result Value Ref Range Status   Specimen Description URINE, CLEAN CATCH  Final   Special Requests NONE  Final   Culture (A)  Final    >=100,000 COLONIES/mL GRAM NEGATIVE RODS IDENTIFICATION AND SUSCEPTIBILITIES TO FOLLOW Performed at [facility], [address], [place], [place] [address]    Report Status PENDING  Incomplete         Radiology Studies: No results found.    Scheduled Meds: . amLODipine  10 mg Per Tube Daily  . aspirin  325 mg Per Tube Daily  . chlorhexidine  15 mL Mouth Rinse BID  . dorzolamide  1 drop Both Eyes BID  . feeding supplement (PRO-STAT SUGAR FREE 64)  30 mL Per Tube Daily  . ferrous sulfate  300 mg Per Tube BID WC  . folic acid  2 mg Per Tube Daily  . free water  200 mL Per Tube Q8H  . hydrALAZINE  100 mg Per Tube BID  . insulin aspart  0-9 Units Subcutaneous Q4H  . lacosamide  50 mg Per Tube BID  . levETIRAcetam  500 mg Per Tube BID  . lisinopril  20 mg Per Tube Daily  . mouth rinse  15 mL Mouth Rinse q12n4p  . metoprolol tartrate  25 mg Per Tube BID  . nicotine  14 mg Transdermal Daily  . OLANZapine  5 mg Per Tube QHS  . polyethylene glycol  17 g Per Tube BID  . senna  1 tablet Per  Tube QHS  . silodosin  8 mg Oral Daily  . sodium chloride flush  10-40 mL Intracatheter Q12H   Continuous Infusions: . cefTRIAXone (ROCEPHIN)  IV 1 g (01/19/19 0829)  . feeding supplement (JEVITY 1.5 CAL/FIBER) 1,000 mL (01/18/19 1645)     LOS: 38 days      Debbe Odea, MD [facility] Pager: [email or web address] Password [facility] 01/19/2019, 12:51 PM

## 2019-01-20 LAB — GLUCOSE, CAPILLARY
Glucose-Capillary: 173 mg/dL — ABNORMAL HIGH (ref 70–99)
Glucose-Capillary: 183 mg/dL — ABNORMAL HIGH (ref 70–99)
Glucose-Capillary: 187 mg/dL — ABNORMAL HIGH (ref 70–99)
Glucose-Capillary: 185 mg/dL — ABNORMAL HIGH (ref 70–99)
Glucose-Capillary: 171 mg/dL — ABNORMAL HIGH (ref 70–99)
Glucose-Capillary: 149 mg/dL — ABNORMAL HIGH (ref 70–99)

## 2019-01-20 LAB — URINE CULTURE: Culture: >=100000 — AB

## 2019-01-20 LAB — CBC
HCT: 25.5 % — ABNORMAL LOW (ref 39.0–52.0)
Hemoglobin: 7.6 g/dL — ABNORMAL LOW (ref 13.0–17.0)
MCH: 23.2 pg — ABNORMAL LOW (ref 26.0–34.0)
MCHC: 29.8 g/dL — ABNORMAL LOW (ref 30.0–36.0)
MCV: 77.7 fL — ABNORMAL LOW (ref 80.0–100.0)
Platelets: 468 10*3/uL — ABNORMAL HIGH (ref 150–400)
RBC: 3.28 MIL/uL — ABNORMAL LOW (ref 4.22–5.81)
RDW: 16.5 % — ABNORMAL HIGH (ref 11.5–15.5)
WBC: 13.2 10*3/uL — ABNORMAL HIGH (ref 4.0–10.5)
nRBC: 0 % (ref 0.0–0.2)

## 2019-01-20 MED ORDER — SODIUM CHLORIDE 0.9 % IV SOLN
INTRAVENOUS | Status: DC | PRN
  Administered 2019-01-20: 08:00:00 250 mL via INTRAVENOUS

## 2019-01-20 MED ORDER — SODIUM CHLORIDE 0.9 % IV SOLN
1.0000 g | Freq: Three times a day (TID) | INTRAVENOUS | Status: DC
  Administered 2019-01-20 – 2019-01-24 (×12): 1 g via INTRAVENOUS
  Filled 2019-01-20 (×15): qty 1

## 2019-01-20 NOTE — Progress Notes (Signed)
Pharmacy Antibiotic Note  Julian Tyler is a 74 y.o. male admitted on 12/11/2018 with code stroke due to left side weakness, MRI was negative.  Pharmacy has been consulted for meropenem dosing due to Pseudomonas and E.coli UTI. Patient was on Ceftriaxone for 3 days until speciation and susceptibilities came back. Patient has a history of dementia and is very agitated there haven't been urinary symptoms reported but difficult with AMS patients. He had a positive urinalysis on 8/6, WBC is slightly elevated at 13.2 and he remains afebrile.  Plan: Meropenem 1g q8h   Weight: 251 lb 8.7 oz (114.1 kg)  Temp (24hrs), Avg:99.1 F (37.3 C), Min:98.7 F (37.1 C), Max:99.6 F (37.6 C)  Recent Labs  Lab 01/15/19 1039  01/16/19 0428 01/17/19 0430 01/18/19 0327 01/19/19 0333 01/19/19 0836 01/20/19 0824  WBC  --    < > 14.5* 14.0* 15.6* 14.0* 14.8* 13.2*  CREATININE 0.61  --  0.62 0.73  --   --   --   --    < > = values in this interval not displayed.    Estimated Creatinine Clearance: 102.4 mL/min (by C-G formula based on SCr of 0.73 mg/dL).    Allergies  Allergen Reactions  . Hctz [Hydrochlorothiazide] Swelling  . Sulfa Drugs Cross Reactors Swelling  . Zithromax [Azithromycin] Diarrhea  . Robaxin [Methocarbamol] Other (See Comments)    Per MAR    Antimicrobials this admission: Ceftriaxone 8/7>8/9 Meropenem 8/9>   Microbiology results: 8/6 UCx: >100,000 Pseudomonas imipenem sens, 20,000 E.coli cipro and amp resistant    Thank you for allowing pharmacy to be a part of this patient's care.  Phillis Haggis 01/20/2019 11:46 AM

## 2019-01-20 NOTE — Progress Notes (Signed)
PROGRESS NOTE    Julian Tyler   UUV:[id number]  DOB: 1945-12-21  DOA: 12/11/2018 PCP: Milana Na., MD   Brief Narrative:  Julian Tyler is a 73 y/o with HTN, HLD, DM 2, CVA with residual right sided weakness, seizure disorder, dementia who presents as a code stroke for left facial droop and left arm weakness.  MRI negative for CVA.  Admitted for ongoing confusion, agitation, sodium 158, dehydration and AKI. He recently had a hospital admission Due to ongoing inability to swallow, he has received a PEG tube. His agitation/ delirium was severe enough to require restrains and adjustment of medications.   Subjective:  He has no complaints.   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 -HIVnon-reactive - he has a history ofdementia and mental status changes may be relate to progression of his dementia - apparently he was recently started on Seroquel, Xanax and Depakote for combativeness in relation to his dementia - Haldol PRN and Olanzapine QHSwere being use in the hospital for behavioral disturbances - Haldol PRN discontinued to the being stable with Olanzapine alone - he needs to total care with ADLs including feeding   Active Problems:    Severe protein-calorie malnutrition/ dehydration, AKI and hypernatremia -The patient failed a swallow eval in the hospital and the family initially requested a core track -He was hydrated with IVF for dehydration - now has PEG tube and has been tolerating tube feeds (Jevity) - cont Prostat  Recent fever, leukocytosis- ? Urinary retention - temp 101.8 on 8/3, now 99 - WBC is 14.5 - CXR unrevealing- no cough, dyspnea or  hypoxia -UA noted to be + -  Culture sent - started Ceftriaxone- urine culture is growing greater than 100,000 gram-negative rods - urine culture growing pseudomonas and e coli - start Imipenem- Ceftriaxone has partly treated his e coli but will need 7 days of antibiotics for pseudomonas - follow for seizures from Imipenem -   Hypotension with h/o HTN - episode of hypotension 8/5- BP 111/53 which was significantly below his normal - patient was very groggy as well- he had not received any sedatives today and was though to be due to hypotension- given a 500 cc bolus with improvement in mental status and BP - cont Metoprolol, Hydralazine, Amlodipine and Lisinopril- monitoring closely for recurrent hypotension- it has not recurred  H/o CVA - cont ASA  Abnormal echocardiogram with possible mobile density on aortic valve -Blood cultures have remained negative - The patient's son does not want to pursue any further work-up  Anemia- folate deficiency and Iron deficiency - Anemia panel from 7/6 reveals a folate level of 4.5-  started replacing - low Iron saturation noted as well- cont Iron -His hemoglobin has dropped slightly- following- hold off on transfusion  Seizure disorder - cont Vimpat and Keppra   Type 2 diabetes mellitus without complication - cont SSI- A1c was 6.1 on 12/12/18- he was on Lantus  & Metformin  - he currently is not requiring Lantus as sugars in hospital are not elevated enough  Nicotine abuse - taper nicotine patch  Obesity Body mass index is 38.25 kg/m.  Abnormal serum and protein  electrophoresis --Dr. Sarajane Jews discussed this with Dr. Alvy Bimler; kappa and lambda chains were appropriately elevated in the context of renal dysfunction.  There was no M spike.  These tests are unremarkable and no further evaluation is suggested.  Time spent in minutes: 35    DVT prophylaxis: SCDs Code Status: Full code Family Communication: Wife  Disposition Plan: SNF Consultants:    Neuro   IR Procedures:   PEG tube  2 d ECHO  1. The left ventricle has normal systolic function with an ejection fraction of 60-65%. The cavity size was normal. There is mildly increased left ventricular wall thickness. Left ventricular diastolic Doppler parameters are consistent with impaired  relaxation. No evidence of left ventricular regional wall motion abnormalities.  2. The right ventricle has normal systolic function. The cavity was normal. There is no increase in right ventricular wall thickness.  3. Trivial pericardial effusion is present.  4. Mild calcification of the mitral valve leaflet. There is mild mitral annular calcification present. No evidence of mitral valve stenosis. No significant mitral regurgitation.  5. The aortic valve is tricuspid. Moderate calcification of the aortic valve. No stenosis of the aortic valve. In the parasternal long axis view, there appears to be a small mobile mass on the atrial size of the aortic valve, possible vegetation. Poor  images on this study, cannot see aortic valve well in other views. Would suggest TEE to evaluate more closely.  6. The aortic root is normal in size and structure.  7. IVC was normal in size. PA systolic pressure 35 mmHg.  8. Technically difficult study with poor acoustic windows.  Antimicrobials:  Anti-infectives (From admission, onward)   Start     Dose/Rate Route Frequency Ordered Stop   01/18/19 0800  cefTRIAXone (ROCEPHIN) 1 g in sodium chloride 0.9 % 100 mL IVPB     1 g 200 mL/hr over 30 Minutes Intravenous Every 24 hours 01/18/19 0717     01/09/19 1230  ceFAZolin (ANCEF) IVPB 2g/100 mL premix     2 g 200 mL/hr over 30 Minutes Intravenous To Radiology 01/09/19 1215 01/09/19 1302   01/07/19 0000  ceFAZolin (ANCEF) IVPB 2g/100 mL premix     2 g 200 mL/hr over 30 Minutes Intravenous To Radiology 01/04/19 1336 01/07/19 0015   12/12/18 2100  cefTRIAXone (ROCEPHIN) 1 g in sodium chloride 0.9 % 100 mL IVPB   Status:  Discontinued     1 g 200 mL/hr over 30 Minutes Intravenous Every 24 hours 12/12/18 2052 12/15/18 1932       Objective: Vitals:   01/19/19 2011 01/20/19 0000 01/20/19 0434 01/20/19 0742  BP: (!) 137/56 (!) 142/56 (!) 154/61 (!) 132/58  Pulse: 95 89 93 93  Resp: 20 20 20 16   Temp:   99.1 F (37.3 C) 98.7 F (37.1 C)  TempSrc:   Axillary Oral  SpO2: 96% 99% 98% 99%  Weight:        Intake/Output Summary (Last 24 hours) at 01/20/2019 1117 Last data filed at 01/20/2019 0435 Gross per 24 hour  Intake 400 ml  Output 650 ml  Net -250 ml   Filed Weights   01/15/19 0500 01/18/19 0500 01/19/19 0130  Weight: 116.6 kg 114.3 kg 114.1 kg    Examination: General exam: Appears comfortable  HEENT: PERRLA, oral mucosa moist, no sclera icterus or thrush Respiratory system: Clear to auscultation. Respiratory effort normal. Cardiovascular system: S1 & S2 heard,  No murmurs  Gastrointestinal system: Abdomen soft, non-tender, nondistended. Normal bowel sounds  Central nervous system: Alert and oriented only to person. Right sided weakness Extremities: No cyanosis, clubbing or edema Skin: No rashes or ulcers- necrosis/ pressure related injury of right heel noted Psychiatry:  flat affect    Data Reviewed: I have personally reviewed following labs and imaging studies  CBC: Recent Labs  Lab 01/16/19 0428 01/17/19 0430 01/18/19 0327 01/19/19 0333 01/19/19 0836 01/20/19 0824  WBC 14.5* 14.0* 15.6* 14.0* 14.8* 13.2*  NEUTROABS 10.8*  --   --   --   --   --   HGB 8.4* 7.7* 7.9* 7.3* 7.6* 7.6*  HCT 28.4* 26.3* 26.6* 24.7* 25.3* 25.5*  MCV 78.2* 77.6* 77.1* 78.2* 77.4* 77.7*  PLT 399 390 477* 444* 462* 415*   Basic Metabolic Panel: Recent Labs  Lab 01/15/19 1039 01/16/19 0428 01/17/19 0430  NA  --  137 135  K  --  3.9 3.9  CL  --  101 102  CO2  --  23 26  GLUCOSE  --  180* 191*  BUN  --  17 16  CREATININE 0.61 0.62 0.73  CALCIUM  --  9.3 8.8*   GFR: Estimated  Creatinine Clearance: 102.4 mL/min (by C-G formula based on SCr of 0.73 mg/dL). Liver Function Tests: No results for input(s): AST, ALT, ALKPHOS, BILITOT, PROT, ALBUMIN in the last 168 hours. No results for input(s): LIPASE, AMYLASE in the last 168 hours. No results for input(s): AMMONIA in the last 168 hours. Coagulation Profile: No results for input(s): INR, PROTIME in the last 168 hours. Cardiac Enzymes: No results for input(s): CKTOTAL, CKMB, CKMBINDEX, TROPONINI in the last 168 hours. BNP (last 3 results) No results for input(s): PROBNP in the last 8760 hours. HbA1C: No results for input(s): HGBA1C in the last 72 hours. CBG: Recent Labs  Lab 01/19/19 1616 01/19/19 2013 01/20/19 0211 01/20/19 0430 01/20/19 0915  GLUCAP 191* 182* 173* 183* 187*   Lipid Profile: No results for input(s): CHOL, HDL, LDLCALC, TRIG, CHOLHDL, LDLDIRECT in the last 72 hours. Thyroid Function Tests: No results for input(s): TSH, T4TOTAL, FREET4, T3FREE, THYROIDAB in the last 72 hours. Anemia Panel: No results for input(s): VITAMINB12, FOLATE, FERRITIN, TIBC, IRON, RETICCTPCT in the last 72 hours. Urine analysis:    Component Value Date/Time   COLORURINE YELLOW 01/17/2019 0015   APPEARANCEUR HAZY (A) 01/17/2019 0015   LABSPEC 1.019 01/17/2019 0015   PHURINE 7.0 01/17/2019 0015   GLUCOSEU NEGATIVE 01/17/2019 0015   HGBUR NEGATIVE 01/17/2019 0015   BILIRUBINUR NEGATIVE 01/17/2019 0015   KETONESUR NEGATIVE 01/17/2019 0015   PROTEINUR NEGATIVE 01/17/2019 0015   NITRITE POSITIVE (A) 01/17/2019 0015   LEUKOCYTESUR MODERATE (A) 01/17/2019 0015   Sepsis Labs: @LABRCNTIP (procalcitonin:4,lacticidven:4) ) Recent Results (from the past 240 hour(s))  SARS Coronavirus 2 [facility] order, Performed in [place] hospital lab)     Status: None   Collection Time: 01/14/19 11:20 AM  Result Value Ref Range Status   SARS Coronavirus 2 NEGATIVE NEGATIVE Final    Comment: (NOTE) If result is NEGATIVE  SARS-CoV-2 target nucleic acids are NOT DETECTED. The SARS-CoV-2 RNA is generally detectable in upper and lower  respiratory specimens during the acute phase of infection. The lowest  concentration of SARS-CoV-2 viral copies this assay can detect is 250  copies / mL. A negative result does not preclude SARS-CoV-2 infection  and should not be used as the sole basis for treatment or other  patient management decisions.  A negative result may occur with  improper specimen collection /  handling, submission of specimen other  than nasopharyngeal swab, presence of viral mutation(s) within the  areas targeted by this assay, and inadequate number of viral copies  (<250 copies / mL). A negative result must be combined with clinical  observations, patient history, and epidemiological information. If result is POSITIVE SARS-CoV-2 target nucleic acids are DETECTED. The SARS-CoV-2 RNA is generally detectable in upper and lower  respiratory specimens dur ing the acute phase of infection.  Positive  results are indicative of active infection with SARS-CoV-2.  Clinical  correlation with patient history and other diagnostic information is  necessary to determine patient infection status.  Positive results do  not rule out bacterial infection or co-infection with other viruses. If result is PRESUMPTIVE POSTIVE SARS-CoV-2 nucleic acids MAY BE PRESENT.   A presumptive positive result was obtained on the submitted specimen  and confirmed on repeat testing.  While 2019 novel coronavirus  (SARS-CoV-2) nucleic acids may be present in the submitted sample  additional confirmatory testing may be necessary for epidemiological  and / or clinical management purposes  to differentiate between  SARS-CoV-2 and other Sarbecovirus currently known to infect humans.  If clinically indicated additional testing with an alternate test  methodology [phone]) is advised. The SARS-CoV-2 RNA is generally  detectable in upper  and lower respiratory sp ecimens during the acute  phase of infection. The expected result is Negative. Fact Sheet for Patients:  StrictlyIdeas.no Fact Sheet for Healthcare Providers: BankingDealers.co.za This test is not yet approved or cleared by the [place] FDA and has been authorized for detection and/or diagnosis of SARS-CoV-2 by FDA under an Emergency Use Authorization (EUA).  This EUA will remain in effect (meaning this test can be used) for the duration of the COVID-19 declaration under Section 564(b)(1) of the Act, 21 U.S.C. section 360bbb-3(b)(1), unless the authorization is terminated or revoked sooner. Performed at [facility], [place] [address], [place], [place] [address]   Culture, Urine     Status: Abnormal   Collection Time: 01/17/19  3:27 AM   Specimen: Urine, Clean Catch  Result Value Ref Range Status   Specimen Description URINE, CLEAN CATCH  Final   Special Requests   Final    NONE Performed at [facility], [place] [address], [place], [place] [address]    Culture (A)  Final    >=100,000 COLONIES/mL PSEUDOMONAS AERUGINOSA 20,000 COLONIES/mL ESCHERICHIA COLI    Report Status 01/20/2019 FINAL  Final   Organism ID, Bacteria PSEUDOMONAS AERUGINOSA (A)  Final   Organism ID, Bacteria ESCHERICHIA COLI (A)  Final      Susceptibility   Escherichia coli - MIC*    AMPICILLIN >=32 RESISTANT Resistant     CEFAZOLIN <=4 SENSITIVE Sensitive     CEFTRIAXONE <=1 SENSITIVE Sensitive     CIPROFLOXACIN >=4 RESISTANT Resistant     GENTAMICIN <=1 SENSITIVE Sensitive     IMIPENEM <=0.25 SENSITIVE Sensitive     NITROFURANTOIN <=16 SENSITIVE Sensitive     TRIMETH/SULFA <=20 SENSITIVE Sensitive     AMPICILLIN/SULBACTAM 8 SENSITIVE Sensitive     PIP/TAZO <=4 SENSITIVE Sensitive     Extended ESBL NEGATIVE Sensitive     * 20,000 COLONIES/mL ESCHERICHIA COLI   Pseudomonas aeruginosa - MIC*    CEFTAZIDIME 16 INTERMEDIATE  Intermediate     CIPROFLOXACIN <=0.25 SENSITIVE Sensitive     GENTAMICIN <=1 SENSITIVE Sensitive     IMIPENEM 1 SENSITIVE Sensitive     CEFEPIME INTERMEDIATE Intermediate     * >=  100,000 COLONIES/mL PSEUDOMONAS AERUGINOSA  SARS CORONAVIRUS 2 Nasal Swab Aptima Multi Swab     Status: None   Collection Time: 01/19/19 12:27 PM   Specimen: Aptima Multi Swab; Nasal Swab  Result Value Ref Range Status   SARS Coronavirus 2 NEGATIVE NEGATIVE Final    Comment: (NOTE) SARS-CoV-2 target nucleic acids are NOT DETECTED. The SARS-CoV-2 RNA is generally detectable in upper and lower respiratory specimens during the acute phase of infection. Negative results do not preclude SARS-CoV-2 infection, do not rule out co-infections with other pathogens, and should not be used as the sole basis for treatment or other patient management decisions. Negative results must be combined with clinical observations, patient history, and epidemiological information. The expected result is Negative. Fact Sheet for Patients: SugarRoll.be Fact Sheet for Healthcare Providers: https://www.woods-mathews.com/ This test is not yet approved or cleared by the [place] FDA and  has been authorized for detection and/or diagnosis of SARS-CoV-2 by FDA under an Emergency Use Authorization (EUA). This EUA will remain  in effect (meaning this test can be used) for the duration of the COVID-19 declaration under Section 56 4(b)(1) of the Act, 21 U.S.C. section 360bbb-3(b)(1), unless the authorization is terminated or revoked sooner. Performed at [facility], [place] [address], [place], [place] [address]          Radiology Studies: No results found.    Scheduled Meds: . amLODipine  10 mg Per Tube Daily  . aspirin  325 mg Per Tube Daily  . chlorhexidine  15 mL Mouth Rinse BID  . dorzolamide  1 drop Both Eyes BID  . feeding supplement (PRO-STAT SUGAR FREE 64)  30 mL Per  Tube Daily  . ferrous sulfate  300 mg Per Tube BID WC  . folic acid  2 mg Per Tube Daily  . free water  200 mL Per Tube Q8H  . hydrALAZINE  100 mg Per Tube BID  . insulin aspart  0-9 Units Subcutaneous Q4H  . lacosamide  50 mg Per Tube BID  . levETIRAcetam  500 mg Per Tube BID  . lisinopril  20 mg Per Tube Daily  . mouth rinse  15 mL Mouth Rinse q12n4p  . metoprolol tartrate  25 mg Per Tube BID  . nicotine  14 mg Transdermal Daily  . OLANZapine  5 mg Per Tube QHS  . polyethylene glycol  17 g Per Tube BID  . senna  1 tablet Per Tube QHS  . silodosin  8 mg Oral Daily  . sodium chloride flush  10-40 mL Intracatheter Q12H   Continuous Infusions: . sodium chloride 250 mL (01/20/19 0759)  . cefTRIAXone (ROCEPHIN)  IV 1 g (01/20/19 0808)  . feeding supplement (JEVITY 1.5 CAL/FIBER) 1,000 mL (01/19/19 1700)     LOS: 39 days      Debbe Odea, MD [facility] Pager: [email or web address] Password [email or web address] 01/20/2019, 11:17 AM

## 2019-01-21 LAB — GLUCOSE, CAPILLARY
Glucose-Capillary: 166 mg/dL — ABNORMAL HIGH (ref 70–99)
Glucose-Capillary: 166 mg/dL — ABNORMAL HIGH (ref 70–99)
Glucose-Capillary: 172 mg/dL — ABNORMAL HIGH (ref 70–99)
Glucose-Capillary: 175 mg/dL — ABNORMAL HIGH (ref 70–99)
Glucose-Capillary: 193 mg/dL — ABNORMAL HIGH (ref 70–99)
Glucose-Capillary: 199 mg/dL — ABNORMAL HIGH (ref 70–99)

## 2019-01-21 LAB — CBC
HCT: 26.4 % — ABNORMAL LOW (ref 39.0–52.0)
Hemoglobin: 7.8 g/dL — ABNORMAL LOW (ref 13.0–17.0)
MCH: 23 pg — ABNORMAL LOW (ref 26.0–34.0)
MCHC: 29.5 g/dL — ABNORMAL LOW (ref 30.0–36.0)
MCV: 77.9 fL — ABNORMAL LOW (ref 80.0–100.0)
Platelets: 449 10*3/uL — ABNORMAL HIGH (ref 150–400)
RBC: 3.39 MIL/uL — ABNORMAL LOW (ref 4.22–5.81)
RDW: 16.9 % — ABNORMAL HIGH (ref 11.5–15.5)
WBC: 13.6 10*3/uL — ABNORMAL HIGH (ref 4.0–10.5)
nRBC: 0 % (ref 0.0–0.2)

## 2019-01-21 LAB — BASIC METABOLIC PANEL
Anion gap: 10 (ref 5–15)
BUN: 14 mg/dL (ref 8–23)
CO2: 23 mmol/L (ref 22–32)
Calcium: 8.9 mg/dL (ref 8.9–10.3)
Chloride: 106 mmol/L (ref 98–111)
Creatinine, Ser: 0.66 mg/dL (ref 0.61–1.24)
GFR calc Af Amer: >60 mL/min (ref >60)
GFR calc non Af Amer: >60 mL/min (ref >60)
Glucose, Bld: 177 mg/dL — ABNORMAL HIGH (ref 70–99)
Potassium: 3.8 mmol/L (ref 3.5–5.1)
Sodium: 139 mmol/L (ref 135–145)

## 2019-01-21 NOTE — Plan of Care (Signed)
  Problem: Activity: Goal: Risk for activity intolerance will decrease Outcome: Progressing   Problem: Coping: Goal: Level of anxiety will decrease Outcome: Progressing   Problem: Pain Managment: Goal: General experience of comfort will improve Outcome: Progressing   Problem: Education: Goal: Knowledge of disease or condition will improve Outcome: Progressing Goal: Knowledge of secondary prevention will improve Outcome: Progressing Goal: Knowledge of patient specific risk factors addressed and post discharge goals established will improve Outcome: Progressing   Problem: Nutrition: Goal: Risk of aspiration will decrease Outcome: Progressing Goal: Dietary intake will improve Outcome: Progressing   Problem: Nutrition Goal: Nutritional status is improving Description: Monitor and assess patient for malnutrition (ex- brittle hair, bruises, dry skin, pale skin and conjunctiva, muscle wasting, smooth red tongue, and disorientation). Collaborate with interdisciplinary team and initiate plan and interventions as ordered.  Monitor patient's weight and dietary intake as ordered or per policy. Utilize nutrition screening tool and intervene per policy. Determine patient's food preferences and provide high-protein, high-caloric foods as appropriate.  Outcome: Progressing  Ival Bible, BSN, RN

## 2019-01-21 NOTE — Progress Notes (Signed)
PROGRESS NOTE    Julian Tyler   YIR:[id number]  DOB: 1945/09/16  DOA: 12/11/2018 PCP: Milana Na., MD   Brief Narrative:  Julian Tyler is a 73 y/o with HTN, HLD, DM 2, CVA with residual right sided weakness, seizure disorder, dementia who presents as a code stroke for left facial droop and left arm weakness.  MRI negative for CVA.  Admitted for ongoing confusion, agitation, sodium 158, dehydration and AKI. He recently had a hospital admission Due to ongoing inability to swallow, he has received a PEG tube. His agitation/ delirium was severe enough to require restrains and adjustment of medications.   Subjective:  He has no complaints today.   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 -HIVnon-reactive - he has a history ofdementia and mental status changes may be relate to progression of his dementia - apparently he was recently started on Seroquel, Xanax and Depakote for combativeness in relation to his dementia - Haldol PRN and Olanzapine QHSwere being use in the hospital for behavioral disturbances - Haldol PRN discontinued to the being stable with Olanzapine alone - he needs to total care with ADLs including feeding   Active Problems:    Severe protein-calorie malnutrition/ dehydration, AKI and hypernatremia -The patient failed a swallow eval in the hospital and the family initially requested a core track -He was hydrated with IVF for dehydration - now has PEG tube and has been tolerating tube feeds (Jevity) - cont Prostat  Recent fever, leukocytosis- ? Urinary retention - temp 101.8 on 8/3, now 99 - WBC is 14.5 - CXR unrevealing- no cough, dyspnea  or hypoxia -UA noted to be + -  Culture sent - started Ceftriaxone- urine culture is growing greater than 100,000 gram-negative rods - urine culture growing pseudomonas and e coli - start Imipenem- Ceftriaxone has partly treated his e coli but will need 7 days of antibiotics for pseudomonas - follow for seizures from Imipenem -  - continues to have elevated WBC and low grade fevers- cont IV antibiotics   Hypotension with h/o HTN - episode of hypotension 8/5- BP 111/53 which was significantly below his normal - patient was very groggy as well- he had not received any sedatives today and was though to be due to hypotension- given a 500 cc bolus with improvement in mental status and BP - cont Metoprolol, Hydralazine, Amlodipine and Lisinopril- monitoring closely for recurrent hypotension- it has not recurred  H/o CVA - cont ASA  Abnormal echocardiogram with possible mobile density on aortic valve -Blood cultures have remained negative - The patient's son does not want to pursue any further work-up  Anemia- folate deficiency and Iron deficiency - Anemia panel from 7/6 reveals a folate level of 4.5-  started replacing - low Iron saturation noted as well- cont Iron -His hemoglobin has dropped slightly- following- hold off on transfusion  Seizure disorder - cont Vimpat and Keppra   Type 2 diabetes mellitus without complication - cont SSI- A1c was 6.1 on 12/12/18- he was on Lantus  & Metformin  - he currently is not requiring Lantus as sugars in hospital are not elevated enough  Nicotine abuse - taper  nicotine patch  Obesity Body mass index is 40.6 kg/m.  Abnormal serum and protein electrophoresis --Dr. Sarajane Jews discussed this with Dr. Alvy Bimler; kappa and lambda chains were appropriately elevated in the context of renal dysfunction.  There was no M spike.  These tests are unremarkable and no further evaluation is suggested.  Time spent in minutes: 35    DVT prophylaxis: SCDs Code  Status: Full code Family Communication: Wife  Disposition Plan: SNF Consultants:   Neuro   IR Procedures:   PEG tube  2 d ECHO  1. The left ventricle has normal systolic function with an ejection fraction of 60-65%. The cavity size was normal. There is mildly increased left ventricular wall thickness. Left ventricular diastolic Doppler parameters are consistent with impaired  relaxation. No evidence of left ventricular regional wall motion abnormalities.  2. The right ventricle has normal systolic function. The cavity was normal. There is no increase in right ventricular wall thickness.  3. Trivial pericardial effusion is present.  4. Mild calcification of the mitral valve leaflet. There is mild mitral annular calcification present. No evidence of mitral valve stenosis. No significant mitral regurgitation.  5. The aortic valve is tricuspid. Moderate calcification of the aortic valve. No stenosis of the aortic valve. In the parasternal long axis view, there appears to be a small mobile mass on the atrial size of the aortic valve, possible vegetation. Poor  images on this study, cannot see aortic valve well in other views. Would suggest TEE to evaluate more closely.  6. The aortic root is normal in size and structure.  7. IVC was normal in size. PA systolic pressure 35 mmHg.  8. Technically difficult study with poor acoustic windows.  Antimicrobials:  Anti-infectives (From admission, onward)   Start     Dose/Rate Route Frequency Ordered Stop   01/20/19 1400  meropenem (MERREM) 1 g in sodium chloride 0.9 % 100 mL IVPB     1 g 200 mL/hr over 30 Minutes Intravenous Every 8 hours 01/20/19 1152     01/18/19 0800  cefTRIAXone (ROCEPHIN) 1 g in sodium chloride 0.9 % 100 mL IVPB  Status:  Discontinued     1 g 200 mL/hr over 30 Minutes Intravenous Every 24 hours 01/18/19 0717 01/20/19 1122   01/09/19 1230  ceFAZolin (ANCEF) IVPB 2g/100 mL premix     2 g 200 mL/hr over 30 Minutes Intravenous  To Radiology 01/09/19 1215 01/09/19 1302   01/07/19 0000  ceFAZolin (ANCEF) IVPB 2g/100 mL premix     2 g 200 mL/hr over 30 Minutes Intravenous To Radiology 01/04/19 1336 01/07/19 0015   12/12/18 2100  cefTRIAXone (ROCEPHIN) 1 g in sodium chloride 0.9 % 100 mL IVPB  Status:  Discontinued     1 g 200 mL/hr over 30 Minutes Intravenous Every 24 hours 12/12/18 2052 12/15/18 1932       Objective: Vitals:   01/21/19 0426 01/21/19 0500 01/21/19 0744 01/21/19 1142  BP: (!) 144/63  (!) 137/59 (!) 147/60  Pulse: 98  99 (!) 110  Resp: 19  18 16   Temp: 99.6 F (37.6 C)  99.8 F (37.7 C) 99.8 F (37.7 C)  TempSrc: Oral  Axillary Oral  SpO2: 100%  100% 97%  Weight:  121.1 kg      Intake/Output Summary (Last 24 hours) at 01/21/2019 1549 Last data filed at 01/21/2019 0152 Gross per 24 hour  Intake 154.83 ml  Output 911 ml  Net -756.17 ml   Filed Weights   01/18/19  0500 01/19/19 0130 01/21/19 0500  Weight: 114.3 kg 114.1 kg 121.1 kg    Examination: General exam: Appears comfortable  HEENT: PERRLA, oral mucosa moist, no sclera icterus or thrush Respiratory system: Clear to auscultation. Respiratory effort normal. Cardiovascular system: S1 & S2 heard,  No murmurs  Gastrointestinal system: Abdomen soft, non-tender, nondistended. Normal bowel sounds   Central nervous system: Alert and oriented only to person. Right sided weakness Extremities: No cyanosis, clubbing or edema Skin: No rashes or ulcers- necrosis/ pressure related injury of right heel noted Psychiatry:  flat affect    Data Reviewed: I have personally reviewed following labs and imaging studies  CBC: Recent Labs  Lab 01/16/19 0428  01/18/19 0327 01/19/19 0333 01/19/19 0836 01/20/19 0824 01/21/19 0621  WBC 14.5*   < > 15.6* 14.0* 14.8* 13.2* 13.6*  NEUTROABS 10.8*  --   --   --   --   --   --   HGB 8.4*   < > 7.9* 7.3* 7.6* 7.6* 7.8*  HCT 28.4*   < > 26.6* 24.7* 25.3* 25.5* 26.4*  MCV 78.2*   < > 77.1* 78.2* 77.4*  77.7* 77.9*  PLT 399   < > 477* 444* 462* 468* 449*   < > = values in this interval not displayed.   Basic Metabolic Panel: Recent Labs  Lab 01/15/19 1039 01/16/19 0428 01/17/19 0430 01/21/19 0621  NA  --  137 135 139  K  --  3.9 3.9 3.8  CL  --  101 102 106  CO2  --  23 26 23   GLUCOSE  --  180* 191* 177*  BUN  --  17 16 14   CREATININE 0.61 0.62 0.73 0.66  CALCIUM  --  9.3 8.8* 8.9   GFR: Estimated Creatinine Clearance: 105.7 mL/min (by C-G formula based on SCr of 0.66 mg/dL). Liver Function Tests: No results for input(s): AST, ALT, ALKPHOS, BILITOT, PROT, ALBUMIN in the last 168 hours. No results for input(s): LIPASE, AMYLASE in the last 168 hours. No results for input(s): AMMONIA in the last 168 hours. Coagulation Profile: No results for input(s): INR, PROTIME in the last 168 hours. Cardiac Enzymes: No results for input(s): CKTOTAL, CKMB, CKMBINDEX, TROPONINI in the last 168 hours. BNP (last 3 results) No results for input(s): PROBNP in the last 8760 hours. HbA1C: No results for input(s): HGBA1C in the last 72 hours. CBG: Recent Labs  Lab 01/20/19 2023 01/21/19 0037 01/21/19 0425 01/21/19 0742 01/21/19 1140  GLUCAP 149* 166* 172* 166* 193*   Lipid Profile: No results for input(s): CHOL, HDL, LDLCALC, TRIG, CHOLHDL, LDLDIRECT in the last 72 hours. Thyroid Function Tests: No results for input(s): TSH, T4TOTAL, FREET4, T3FREE, THYROIDAB in the last 72 hours. Anemia Panel: No results for input(s): VITAMINB12, FOLATE, FERRITIN, TIBC, IRON, RETICCTPCT in the last 72 hours. Urine analysis:    Component Value Date/Time   COLORURINE YELLOW 01/17/2019 0015   APPEARANCEUR HAZY (A) 01/17/2019 0015   LABSPEC 1.019 01/17/2019 0015   PHURINE 7.0 01/17/2019 0015   GLUCOSEU NEGATIVE 01/17/2019 0015   HGBUR NEGATIVE 01/17/2019 0015   BILIRUBINUR NEGATIVE 01/17/2019 0015   KETONESUR NEGATIVE 01/17/2019 0015   PROTEINUR NEGATIVE 01/17/2019 0015   NITRITE POSITIVE (A)  01/17/2019 0015   LEUKOCYTESUR MODERATE (A) 01/17/2019 0015   Sepsis Labs: @LABRCNTIP (procalcitonin:4,lacticidven:4) ) Recent Results (from the past 240 hour(s))  SARS Coronavirus 2 [facility] order, Performed in [place] hospital lab)     Status: None   Collection Time: 01/14/19 11:20  AM  Result Value Ref Range Status   SARS Coronavirus 2 NEGATIVE NEGATIVE Final    Comment: (NOTE) If result is NEGATIVE SARS-CoV-2 target nucleic acids are NOT DETECTED. The SARS-CoV-2 RNA is generally detectable in upper and lower  respiratory specimens during the acute phase of infection. The lowest  concentration of SARS-CoV-2 viral copies this assay can detect is 250  copies / mL. A negative result does not preclude SARS-CoV-2 infection  and should not be used as the sole basis for treatment or other  patient management decisions.  A negative result may occur with  improper specimen collection / handling, submission of specimen other  than nasopharyngeal swab, presence of viral mutation(s) within the  areas targeted by this assay, and inadequate number of viral copies  (<250 copies / mL). A negative result must be combined with clinical  observations, patient history, and epidemiological information. If result is POSITIVE SARS-CoV-2 target nucleic acids are DETECTED. The SARS-CoV-2 RNA is generally detectable in upper and lower  respiratory specimens dur ing the acute phase of infection.  Positive  results are indicative of active infection with SARS-CoV-2.  Clinical  correlation with patient history and other diagnostic information is  necessary to determine patient infection status.  Positive results do  not rule out bacterial infection or co-infection with other viruses. If result is PRESUMPTIVE POSTIVE SARS-CoV-2 nucleic acids MAY BE PRESENT.   A presumptive positive result was obtained on the submitted specimen  and confirmed on repeat testing.  While 2019 novel coronavirus   (SARS-CoV-2) nucleic acids may be present in the submitted sample  additional confirmatory testing may be necessary for epidemiological  and / or clinical management purposes  to differentiate between  SARS-CoV-2 and other Sarbecovirus currently known to infect humans.  If clinically indicated additional testing with an alternate test  methodology [phone]) is advised. The SARS-CoV-2 RNA is generally  detectable in upper and lower respiratory sp ecimens during the acute  phase of infection. The expected result is Negative. Fact Sheet for Patients:  StrictlyIdeas.no Fact Sheet for Healthcare Providers: BankingDealers.co.za This test is not yet approved or cleared by the [place] FDA and has been authorized for detection and/or diagnosis of SARS-CoV-2 by FDA under an Emergency Use Authorization (EUA).  This EUA will remain in effect (meaning this test can be used) for the duration of the COVID-19 declaration under Section 564(b)(1) of the Act, 21 U.S.C. section 360bbb-3(b)(1), unless the authorization is terminated or revoked sooner. Performed at [facility], [place] [address], [place], [place] [address]   Culture, Urine     Status: Abnormal   Collection Time: 01/17/19  3:27 AM   Specimen: Urine, Clean Catch  Result Value Ref Range Status   Specimen Description URINE, CLEAN CATCH  Final   Special Requests   Final    NONE Performed at [facility], [place] [address] [place], [place] [address]    Culture (A)  Final    >=100,000 COLONIES/mL PSEUDOMONAS AERUGINOSA 20,000 COLONIES/mL ESCHERICHIA COLI    Report Status 01/20/2019 FINAL  Final   Organism ID, Bacteria PSEUDOMONAS AERUGINOSA (A)  Final   Organism ID, Bacteria ESCHERICHIA COLI (A)  Final      Susceptibility   Escherichia coli - MIC*    AMPICILLIN >=32 RESISTANT Resistant     CEFAZOLIN <=4 SENSITIVE Sensitive     CEFTRIAXONE <=1 SENSITIVE Sensitive      CIPROFLOXACIN >=4 RESISTANT Resistant     GENTAMICIN <=1 SENSITIVE  Sensitive     IMIPENEM <=0.25 SENSITIVE Sensitive     NITROFURANTOIN <=16 SENSITIVE Sensitive     TRIMETH/SULFA <=20 SENSITIVE Sensitive     AMPICILLIN/SULBACTAM 8 SENSITIVE Sensitive     PIP/TAZO <=4 SENSITIVE Sensitive     Extended ESBL NEGATIVE Sensitive     * 20,000 COLONIES/mL ESCHERICHIA COLI   Pseudomonas aeruginosa - MIC*    CEFTAZIDIME 16 INTERMEDIATE Intermediate     CIPROFLOXACIN <=0.25 SENSITIVE Sensitive     GENTAMICIN <=1 SENSITIVE Sensitive     IMIPENEM 1 SENSITIVE Sensitive     CEFEPIME INTERMEDIATE Intermediate     * >=100,000 COLONIES/mL PSEUDOMONAS AERUGINOSA  SARS CORONAVIRUS 2 Nasal Swab Aptima Multi Swab     Status: None   Collection Time: 01/19/19 12:27 PM   Specimen: Aptima Multi Swab; Nasal Swab  Result Value Ref Range Status   SARS Coronavirus 2 NEGATIVE NEGATIVE Final    Comment: (NOTE) SARS-CoV-2 target nucleic acids are NOT DETECTED. The SARS-CoV-2 RNA is generally detectable in upper and lower respiratory specimens during the acute phase of infection. Negative results do not preclude SARS-CoV-2 infection, do not rule out co-infections with other pathogens, and should not be used as the sole basis for treatment or other patient management decisions. Negative results must be combined with clinical observations, patient history, and epidemiological information. The expected result is Negative. Fact Sheet for Patients: SugarRoll.be Fact Sheet for Healthcare Providers: https://www.woods-mathews.com/ This test is not yet approved or cleared by the [place] FDA and  has been authorized for detection and/or diagnosis of SARS-CoV-2 by FDA under an Emergency Use Authorization (EUA). This EUA will remain  in effect (meaning this test can be used) for the duration of the COVID-19 declaration under Section 56 4(b)(1) of the Act, 21 U.S.C. section  360bbb-3(b)(1), unless the authorization is terminated or revoked sooner. Performed at [facility], [place] [address] [place], [place] [address]          Radiology Studies: No results found.    Scheduled Meds: . amLODipine  10 mg Per Tube Daily  . aspirin  325 mg Per Tube Daily  . chlorhexidine  15 mL Mouth Rinse BID  . dorzolamide  1 drop Both Eyes BID  . feeding supplement (PRO-STAT SUGAR FREE 64)  30 mL Per Tube Daily  . ferrous sulfate  300 mg Per Tube BID WC  . folic acid  2 mg Per Tube Daily  . free water  200 mL Per Tube Q8H  . hydrALAZINE  100 mg Per Tube BID  . insulin aspart  0-9 Units Subcutaneous Q4H  . lacosamide  50 mg Per Tube BID  . levETIRAcetam  500 mg Per Tube BID  . lisinopril  20 mg Per Tube Daily  . mouth rinse  15 mL Mouth Rinse q12n4p  . metoprolol tartrate  25 mg Per Tube BID  . nicotine  14 mg Transdermal Daily  . OLANZapine  5 mg Per Tube QHS  . polyethylene glycol  17 g Per Tube BID  . senna  1 tablet Per Tube QHS  . silodosin  8 mg Oral Daily  . sodium chloride flush  10-40 mL Intracatheter Q12H   Continuous Infusions: . sodium chloride 10 mL/hr at 01/20/19 1200  . feeding supplement (JEVITY 1.5 CAL/FIBER) 1,000 mL (01/21/19 1224)  . meropenem (MERREM) IV 1 g (01/21/19 0516)     LOS: 40 days      Debbe Odea, MD [facility] Pager: [email or web address] Password  [email or web address] 01/21/2019, 3:49 PM

## 2019-01-22 LAB — CBC
HCT: 25.3 % — ABNORMAL LOW (ref 39.0–52.0)
Hemoglobin: 7.6 g/dL — ABNORMAL LOW (ref 13.0–17.0)
MCH: 23.2 pg — ABNORMAL LOW (ref 26.0–34.0)
MCHC: 30 g/dL (ref 30.0–36.0)
MCV: 77.1 fL — ABNORMAL LOW (ref 80.0–100.0)
Platelets: 470 10*3/uL — ABNORMAL HIGH (ref 150–400)
RBC: 3.28 MIL/uL — ABNORMAL LOW (ref 4.22–5.81)
RDW: 16.8 % — ABNORMAL HIGH (ref 11.5–15.5)
WBC: 14.2 10*3/uL — ABNORMAL HIGH (ref 4.0–10.5)
nRBC: 0 % (ref 0.0–0.2)

## 2019-01-22 LAB — GLUCOSE, CAPILLARY
Glucose-Capillary: 140 mg/dL — ABNORMAL HIGH (ref 70–99)
Glucose-Capillary: 150 mg/dL — ABNORMAL HIGH (ref 70–99)
Glucose-Capillary: 157 mg/dL — ABNORMAL HIGH (ref 70–99)
Glucose-Capillary: 161 mg/dL — ABNORMAL HIGH (ref 70–99)
Glucose-Capillary: 162 mg/dL — ABNORMAL HIGH (ref 70–99)
Glucose-Capillary: 187 mg/dL — ABNORMAL HIGH (ref 70–99)
Glucose-Capillary: 198 mg/dL — ABNORMAL HIGH (ref 70–99)

## 2019-01-22 LAB — CREATININE, SERUM
Creatinine, Ser: 0.69 mg/dL (ref 0.61–1.24)
GFR calc Af Amer: >60 mL/min (ref >60)
GFR calc non Af Amer: >60 mL/min (ref >60)

## 2019-01-22 NOTE — Progress Notes (Signed)
PROGRESS NOTE    Julian Tyler   WSF:[id number]  DOB: 1946-04-11  DOA: 12/11/2018 PCP: Milana Na., MD   Brief Narrative:  Julian Tyler is a 73 y/o with HTN, HLD, DM 2, CVA with residual right sided weakness, seizure disorder, dementia who presents as a code stroke for left facial droop and left arm weakness.  MRI negative for CVA.  Admitted for ongoing confusion, agitation, sodium 158, dehydration and AKI. He recently had a hospital admission Due to ongoing inability to swallow, he has received a PEG tube. His agitation/ delirium was severe enough to require restrains and adjustment of medications.   Subjective:  Remains confused. No complaints.   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 -HIVnon-reactive - he has a history ofdementia and mental status changes may be relate to progression of his dementia - apparently he was recently started on Seroquel, Xanax and Depakote for combativeness in relation to his dementia -   Olanzapine QHS being use in the hospital for behavioral  Disturbances- he is quite stable on this dose - he needs to total care with ADLs including feeding   Active Problems:   Recent fever, leukocytosis-Pseudomonas UTI-    - temp 101.8 on 8/3, now 99 - WBC is 14.5 - CXR unrevealing- no cough, dyspnea or hypoxia -UA noted to be + -  Culture sent - started Ceftriaxone- urine culture is growing greater than 100,000 gram-negative rods - urine culture growing pseudomonas and e coli - start Imipenem- Ceftriaxone has partly treated his e coli but will need 7 days of antibiotics for pseudomonas - follow for seizures from Imipenem -  - continues  to have elevated WBC and low grade fevers- cont IV antibiotics -  Urinary retention - started on Rapaflo-  RN has checked a bladder scan on 810 and he was retaining only 40 cc  Severe protein-calorie malnutrition/ dehydration, AKI and hypernatremia -The patient failed a swallow eval in the hospital and the family initially requested a core track -He was hydrated with IVF for dehydration - now has PEG tube and has been tolerating tube feeds (Jevity) - cont Prostat  H/o CVA - cont ASA  Abnormal echocardiogram with possible mobile density on aortic valve -Blood cultures have remained negative - The patient's son does not want to pursue any further work-up  Anemia- folate deficiency and Iron deficiency - Anemia panel from 7/6 reveals a folate level of 4.5-  started replacing - low Iron saturation noted as well- cont Iron  Seizure disorder - cont Vimpat and Keppra   Type 2 diabetes mellitus without complication - cont SSI- A1c was 6.1 on 12/12/18- he was on Lantus  & Metformin  - he currently is not requiring Lantus as sugars in hospital are not elevated enough  Nicotine abuse - taper nicotine patch  Obesity Body mass index is 36.95 kg/m.  Abnormal serum and protein electrophoresis --Dr. Sarajane Jews discussed this with Dr. Alvy Bimler; kappa and lambda chains were appropriately elevated in the context of renal dysfunction.  There was no M spike.  These tests are unremarkable and no further evaluation is suggested.  Time spent in minutes: 35  DVT prophylaxis: SCDs Code Status: Full code Family Communication: Wife  Disposition Plan: SNF Consultants:   Neuro   IR Procedures:   PEG tube  2 d ECHO  1. The left ventricle has normal systolic function with an ejection fraction of 60-65%. The cavity size was normal. There is mildly increased left ventricular wall thickness. Left ventricular diastolic Doppler parameters are consistent with impaired  relaxation. No evidence of left  ventricular regional wall motion abnormalities.  2. The right ventricle has normal systolic function. The cavity was normal. There is no increase in right ventricular wall thickness.  3. Trivial pericardial effusion is present.  4. Mild calcification of the mitral valve leaflet. There is mild mitral annular calcification present. No evidence of mitral valve stenosis. No significant mitral regurgitation.  5. The aortic valve is tricuspid. Moderate calcification of the aortic valve. No stenosis of the aortic valve. In the parasternal long axis view, there appears to be a small mobile mass on the atrial size of the aortic valve, possible vegetation. Poor  images on this study, cannot see aortic valve well in other views. Would suggest TEE to evaluate more closely.  6. The aortic root is normal in size and structure.  7. IVC was normal in size. PA systolic pressure 35 mmHg.  8. Technically difficult study with poor acoustic windows.  Antimicrobials:  Anti-infectives (From admission, onward)   Start     Dose/Rate Route Frequency Ordered Stop   01/20/19 1400  meropenem (MERREM) 1 g in sodium chloride 0.9 % 100 mL IVPB     1 g 200 mL/hr over 30 Minutes Intravenous Every 8 hours 01/20/19 1152     01/18/19 0800  cefTRIAXone (ROCEPHIN) 1 g in sodium chloride 0.9 % 100 mL IVPB  Status:  Discontinued     1 g 200 mL/hr over 30 Minutes Intravenous Every 24 hours 01/18/19 0717 01/20/19 1122   01/09/19 1230  ceFAZolin (ANCEF) IVPB 2g/100 mL premix     2 g 200 mL/hr over 30 Minutes Intravenous To Radiology 01/09/19 1215 01/09/19 1302   01/07/19 0000  ceFAZolin (ANCEF) IVPB 2g/100 mL premix     2 g 200 mL/hr over 30 Minutes Intravenous To Radiology 01/04/19 1336 01/07/19 0015   12/12/18 2100  cefTRIAXone (ROCEPHIN) 1 g in sodium chloride 0.9 % 100 mL IVPB  Status:  Discontinued     1 g 200 mL/hr over 30 Minutes Intravenous Every 24 hours 12/12/18 2052 12/15/18 1932       Objective: Vitals:    01/22/19 0023 01/22/19 0352 01/22/19 0735 01/22/19 1213  BP: (!) 131/99 (!) 160/71 (!) 161/68 (!) 145/68  Pulse: 98 95 91 87  Resp: 19 19 16 17   Temp: 99.6 F (37.6 C) 99 F (37.2 C) 98.3 F (36.8 C) 98.6 F (37 C)  TempSrc: Oral  Axillary Oral  SpO2: 99% 100% 99% 99%  Weight:  110.2 kg      Intake/Output Summary (Last 24 hours) at 01/22/2019 1543 Last data filed at 01/22/2019 4235 Gross per 24 hour  Intake 8554 ml  Output 1400 ml  Net 7154 ml   Filed Weights   01/19/19 0130 01/21/19 0500 01/22/19 0352  Weight: 114.1 kg 121.1 kg 110.2 kg    Examination: General exam: Appears comfortable  HEENT: PERRLA, oral mucosa moist, no sclera icterus or thrush Respiratory system: Clear to auscultation. Respiratory effort normal. Cardiovascular system: S1 & S2 heard,  No murmurs  Gastrointestinal system: Abdomen soft, non-tender, nondistended. Normal bowel sounds  Central nervous system: Alert and oriented only to person. Right sided weakness Extremities: No cyanosis, clubbing or edema Skin: No rashes or ulcers Psychiatry:  Mood & affect appropriate.   Data Reviewed: I have personally reviewed following labs and imaging studies  CBC: Recent Labs  Lab 01/16/19 0428  01/19/19 0333 01/19/19 0836 01/20/19 0824 01/21/19 0621 01/22/19 0322  WBC 14.5*   < > 14.0* 14.8* 13.2* 13.6* 14.2*  NEUTROABS 10.8*  --   --   --   --   --   --   HGB 8.4*   < > 7.3* 7.6* 7.6* 7.8* 7.6*  HCT 28.4*   < > 24.7* 25.3* 25.5* 26.4* 25.3*  MCV 78.2*   < > 78.2* 77.4* 77.7* 77.9* 77.1*  PLT 399   < > 444* 462* 468* 449* 470*   < > = values in this interval not displayed.   Basic Metabolic Panel: Recent Labs  Lab 01/16/19 0428 01/17/19 0430 01/21/19 0621 01/22/19 0322  NA 137 135 139  --   K 3.9 3.9 3.8  --   CL 101 102 106  --   CO2 23 26 23   --   GLUCOSE 180* 191* 177*  --   BUN 17 16 14   --   CREATININE 0.62 0.73 0.66 0.69  CALCIUM 9.3 8.8* 8.9  --    GFR: Estimated Creatinine  Clearance: 100.5 mL/min (by C-G formula based on SCr of 0.69 mg/dL). Liver Function Tests: No results for input(s): AST, ALT, ALKPHOS, BILITOT, PROT, ALBUMIN in the last 168 hours. No results for input(s): LIPASE, AMYLASE in the last 168 hours. No results for input(s): AMMONIA in the last 168 hours. Coagulation Profile: No results for input(s): INR, PROTIME in the last 168 hours. Cardiac Enzymes: No results for input(s): CKTOTAL, CKMB, CKMBINDEX, TROPONINI in the last 168 hours. BNP (last 3 results) No results for input(s): PROBNP in the last 8760 hours. HbA1C: No results for input(s): HGBA1C in the last 72 hours. CBG: Recent Labs  Lab 01/21/19 2001 01/22/19 0024 01/22/19 0349 01/22/19 0734 01/22/19 1211  GLUCAP 199* 187* 162* 157* 198*   Lipid Profile: No results for input(s): CHOL, HDL, LDLCALC, TRIG, CHOLHDL, LDLDIRECT in the last 72 hours. Thyroid Function Tests: No results for input(s): TSH, T4TOTAL, FREET4, T3FREE, THYROIDAB in the last 72 hours. Anemia Panel: No results for input(s): VITAMINB12, FOLATE, FERRITIN, TIBC, IRON, RETICCTPCT in the last 72 hours. Urine analysis:    Component Value Date/Time   COLORURINE YELLOW 01/17/2019 0015   APPEARANCEUR HAZY (A) 01/17/2019 0015   LABSPEC 1.019 01/17/2019 0015   PHURINE 7.0 01/17/2019 0015   GLUCOSEU NEGATIVE 01/17/2019 0015   HGBUR NEGATIVE 01/17/2019 0015   BILIRUBINUR NEGATIVE 01/17/2019 0015   KETONESUR NEGATIVE 01/17/2019 0015   PROTEINUR NEGATIVE 01/17/2019 0015   NITRITE POSITIVE (A) 01/17/2019 0015   LEUKOCYTESUR MODERATE (A) 01/17/2019 0015   Sepsis Labs: @LABRCNTIP (procalcitonin:4,lacticidven:4) ) Recent Results (from the past 240 hour(s))  SARS Coronavirus 2 [facility] order, Performed in [place] hospital lab)     Status: None   Collection Time: 01/14/19 11:20 AM  Result Value Ref Range Status   SARS Coronavirus 2 NEGATIVE NEGATIVE Final    Comment: (NOTE) If result is NEGATIVE SARS-CoV-2 target  nucleic acids are NOT DETECTED. The SARS-CoV-2 RNA is generally detectable in upper and lower  respiratory specimens during the acute phase of infection. The lowest  concentration of SARS-CoV-2 viral copies this assay can detect is 250  copies / mL.  A negative result does not preclude SARS-CoV-2 infection  and should not be used as the sole basis for treatment or other  patient management decisions.  A negative result may occur with  improper specimen collection / handling, submission of specimen other  than nasopharyngeal swab, presence of viral mutation(s) within the  areas targeted by this assay, and inadequate number of viral copies  (<250 copies / mL). A negative result must be combined with clinical  observations, patient history, and epidemiological information. If result is POSITIVE SARS-CoV-2 target nucleic acids are DETECTED. The SARS-CoV-2 RNA is generally detectable in upper and lower  respiratory specimens dur ing the acute phase of infection.  Positive  results are indicative of active infection with SARS-CoV-2.  Clinical  correlation with patient history and other diagnostic information is  necessary to determine patient infection status.  Positive results do  not rule out bacterial infection or co-infection with other viruses. If result is PRESUMPTIVE POSTIVE SARS-CoV-2 nucleic acids MAY BE PRESENT.   A presumptive positive result was obtained on the submitted specimen  and confirmed on repeat testing.  While 2019 novel coronavirus  (SARS-CoV-2) nucleic acids may be present in the submitted sample  additional confirmatory testing may be necessary for epidemiological  and / or clinical management purposes  to differentiate between  SARS-CoV-2 and other Sarbecovirus currently known to infect humans.  If clinically indicated additional testing with an alternate test  methodology [id number]) is advised. The SARS-CoV-2 RNA is generally  detectable in upper and lower  respiratory sp ecimens during the acute  phase of infection. The expected result is Negative. Fact Sheet for Patients:  StrictlyIdeas.no Fact Sheet for Healthcare Providers: BankingDealers.co.za This test is not yet approved or cleared by the [place] FDA and has been authorized for detection and/or diagnosis of SARS-CoV-2 by FDA under an Emergency Use Authorization (EUA).  This EUA will remain in effect (meaning this test can be used) for the duration of the COVID-19 declaration under Section 564(b)(1) of the Act, 21 U.S.C. section 360bbb-3(b)(1), unless the authorization is terminated or revoked sooner. Performed at [facility], [place] [address] [place], [place] [address]   Culture, Urine     Status: Abnormal   Collection Time: 01/17/19  3:27 AM   Specimen: Urine, Clean Catch  Result Value Ref Range Status   Specimen Description URINE, CLEAN CATCH  Final   Special Requests   Final    NONE Performed at [facility], [place] [address], [place], [place] [address]    Culture (A)  Final    >=100,000 COLONIES/mL PSEUDOMONAS AERUGINOSA 20,000 COLONIES/mL ESCHERICHIA COLI    Report Status 01/20/2019 FINAL  Final   Organism ID, Bacteria PSEUDOMONAS AERUGINOSA (A)  Final   Organism ID, Bacteria ESCHERICHIA COLI (A)  Final      Susceptibility   Escherichia coli - MIC*    AMPICILLIN >=32 RESISTANT Resistant     CEFAZOLIN <=4 SENSITIVE Sensitive     CEFTRIAXONE <=1 SENSITIVE Sensitive     CIPROFLOXACIN >=4 RESISTANT Resistant     GENTAMICIN <=1 SENSITIVE Sensitive     IMIPENEM <=0.25 SENSITIVE Sensitive     NITROFURANTOIN <=16 SENSITIVE Sensitive     TRIMETH/SULFA <=20 SENSITIVE Sensitive     AMPICILLIN/SULBACTAM 8 SENSITIVE Sensitive     PIP/TAZO <=4 SENSITIVE Sensitive     Extended ESBL NEGATIVE Sensitive     * 20,000 COLONIES/mL ESCHERICHIA COLI   Pseudomonas aeruginosa - MIC*    CEFTAZIDIME 16  INTERMEDIATE  Intermediate     CIPROFLOXACIN <=0.25 SENSITIVE Sensitive     GENTAMICIN <=1 SENSITIVE Sensitive     IMIPENEM 1 SENSITIVE Sensitive     CEFEPIME INTERMEDIATE Intermediate     * >=100,000 COLONIES/mL PSEUDOMONAS AERUGINOSA  SARS CORONAVIRUS 2 Nasal Swab Aptima Multi Swab     Status: None   Collection Time: 01/19/19 12:27 PM   Specimen: Aptima Multi Swab; Nasal Swab  Result Value Ref Range Status   SARS Coronavirus 2 NEGATIVE NEGATIVE Final    Comment: (NOTE) SARS-CoV-2 target nucleic acids are NOT DETECTED. The SARS-CoV-2 RNA is generally detectable in upper and lower respiratory specimens during the acute phase of infection. Negative results do not preclude SARS-CoV-2 infection, do not rule out co-infections with other pathogens, and should not be used as the sole basis for treatment or other patient management decisions. Negative results must be combined with clinical observations, patient history, and epidemiological information. The expected result is Negative. Fact Sheet for Patients: SugarRoll.be Fact Sheet for Healthcare Providers: https://www.woods-mathews.com/ This test is not yet approved or cleared by the [place] FDA and  has been authorized for detection and/or diagnosis of SARS-CoV-2 by FDA under an Emergency Use Authorization (EUA). This EUA will remain  in effect (meaning this test can be used) for the duration of the COVID-19 declaration under Section 56 4(b)(1) of the Act, 21 U.S.C. section 360bbb-3(b)(1), unless the authorization is terminated or revoked sooner. Performed at [facility], [place] [address], [place], [place] [address]          Radiology Studies: No results found.    Scheduled Meds: . amLODipine  10 mg Per Tube Daily  . aspirin  325 mg Per Tube Daily  . chlorhexidine  15 mL Mouth Rinse BID  . dorzolamide  1 drop Both Eyes BID  . feeding supplement (PRO-STAT SUGAR FREE 64)  30 mL Per  Tube Daily  . ferrous sulfate  300 mg Per Tube BID WC  . folic acid  2 mg Per Tube Daily  . free water  200 mL Per Tube Q8H  . hydrALAZINE  100 mg Per Tube BID  . insulin aspart  0-9 Units Subcutaneous Q4H  . lacosamide  50 mg Per Tube BID  . levETIRAcetam  500 mg Per Tube BID  . lisinopril  20 mg Per Tube Daily  . mouth rinse  15 mL Mouth Rinse q12n4p  . metoprolol tartrate  25 mg Per Tube BID  . nicotine  14 mg Transdermal Daily  . OLANZapine  5 mg Per Tube QHS  . polyethylene glycol  17 g Per Tube BID  . senna  1 tablet Per Tube QHS  . silodosin  8 mg Oral Daily  . sodium chloride flush  10-40 mL Intracatheter Q12H   Continuous Infusions: . sodium chloride 10 mL/hr at 01/20/19 1200  . feeding supplement (JEVITY 1.5 CAL/FIBER) 1,000 mL (01/21/19 1224)  . meropenem (MERREM) IV 1 g (01/22/19 1359)     LOS: 41 days      Debbe Odea, MD [facility] Pager: [email or web address] Password [facility] 01/22/2019, 3:43 PM

## 2019-01-22 NOTE — Progress Notes (Signed)
Physical Therapy Treatment Patient Details Name: Julian Tyler MRN: [id number] DOB: April 10, 1946 Today's Date: 01/22/2019    History of Present Illness Pt is a 73 y/o nursing home resident who presents with AMS. MRI negative, and acute metabolic encephalopathy suspected to be 2 dehydration and hypernatremia. Possible UTI as well. PMH: stroke, CKD, Dry eyes, DM, hyperlipidemia    PT Comments    Patient progressing with mobility able to tolerate EOB activity sitting to brush teeth needing mod A for balance.  Feel he would benefit from continued skilled PT in the acute setting and return to SNF at d/c.   Follow Up Recommendations  SNF     Equipment Recommendations  Wheelchair (measurements PT);Wheelchair cushion (measurements PT);Hospital bed;Other (comment)    Recommendations for Other Services       Precautions / Restrictions Precautions Precautions: Fall Precaution Comments: PEG tube    Mobility  Bed Mobility Overal bed mobility: Needs Assistance Bed Mobility: Rolling;Sidelying to Sit;Sit to Supine Rolling: Max assist;+2 for physical assistance Sidelying to sit: Max assist;+2 for physical assistance       General bed mobility comments: rolling in bed for hygiene, then side to sit assist for legs off bed and pt assisting to lift trunk,  Transfers                 General transfer comment: NT  Ambulation/Gait                 Stairs             Wheelchair Mobility    Modified Rankin (Stroke Patients Only)       Balance Overall balance assessment: Needs assistance Sitting-balance support: Feet unsupported Sitting balance-Leahy Scale: Fair Sitting balance - Comments: initially sitting without external support, brushing teeth seated EOB pt needed mod A due to R lateral lean                                    Cognition Arousal/Alertness: Awake/alert Behavior During Therapy: Flat affect Overall Cognitive Status: No  family/caregiver present to determine baseline cognitive functioning                     Current Attention Level: Focused   Following Commands: Follows one step commands with increased time;Follows one step commands consistently Safety/Judgement: Decreased awareness of safety            Exercises      General Comments        Pertinent Vitals/Pain Pain Assessment: Faces Faces Pain Scale: Hurts little more Pain Location: with use of R hand Pain Descriptors / Indicators: Guarding;Grimacing Pain Intervention(s): Monitored during session;Repositioned    Home Living                      Prior Function            PT Goals (current goals can now be found in the care plan section) Progress towards PT goals: Progressing toward goals    Frequency    Min 2X/week      PT Plan Current plan remains appropriate    Co-evaluation              AM-PAC PT "6 Clicks" Mobility   Outcome Measure  Help needed turning from your back to your side while in a flat bed without using bedrails?: Total Help needed moving from lying on your  back to sitting on the side of a flat bed without using bedrails?: Total Help needed moving to and from a bed to a chair (including a wheelchair)?: Total Help needed standing up from a chair using your arms (e.g., wheelchair or bedside chair)?: Total Help needed to walk in hospital room?: Total Help needed climbing 3-5 steps with a railing? : Total 6 Click Score: 6    End of Session   Activity Tolerance: Patient tolerated treatment well Patient left: in bed;with call bell/phone within reach   PT Visit Diagnosis: Muscle weakness (generalized) (M62.81);Difficulty in walking, not elsewhere classified (R26.2);Hemiplegia and hemiparesis Hemiplegia - Right/Left: Right Hemiplegia - dominant/non-dominant: Dominant Hemiplegia - caused by: Cerebral infarction     Time: 6213-0865 PT Time Calculation (min) (ACUTE ONLY): 26  min  Charges:  $Therapeutic Activity: 23-37 mins                     Magda Kiel, [place] Acute Rehabilitation Services [phone] 01/22/2019    Reginia Naas 01/22/2019, 6:44 PM

## 2019-01-22 NOTE — Progress Notes (Signed)
Occupational Therapy Treatment Patient Details Name: Julian Tyler MRN: [id number] DOB: 1945/12/10 Today's Date: 01/22/2019    History of present illness Pt is a 73 y/o nursing home resident who presents with AMS. MRI negative, and acute metabolic encephalopathy suspected to be 2 dehydration and hypernatremia. Possible UTI as well. PMH: stroke, CKD, Dry eyes, DM, hyperlipidemia   OT comments  Pt opening his eyes only briefly, but following simple commands with UEs (ie: touch your nose, scratch your head). Performed B UE A/PROM. Grimaces with shoulder PROM, limited to 90 degrees FF. Continues to be appropriate for return to SNF.  Follow Up Recommendations  SNF    Equipment Recommendations  None recommended by OT    Recommendations for Other Services      Precautions / Restrictions Precautions Precautions: Other (comment);Fall(PEG tube)       Mobility Bed Mobility Overal bed mobility: Needs Assistance             General bed mobility comments: +2 total to pull up in bed  Transfers                      Balance                                           ADL either performed or assessed with clinical judgement   ADL                                         General ADL Comments: pt performing hand to face with B UEs     Vision       Perception     Praxis      Cognition Arousal/Alertness: Lethargic Behavior During Therapy: Flat affect Overall Cognitive Status: No family/caregiver present to determine baseline cognitive functioning                         Following Commands: Follows one step commands with increased time Safety/Judgement: Decreased awareness of safety(pulls at lines unless restrained)     General Comments: pt opening his eyes only momentarily when moving LEs to float heels        Exercises Exercises: General Lower Extremity Other Exercises Other Exercises: shoulder flex, elbow  flex/ext, wrist flex/ext and hand squeezes x 10 reps each exercise   Shoulder Instructions       General Comments      Pertinent Vitals/ Pain       Pain Assessment: Faces Faces Pain Scale: Hurts little more Pain Location: R shoulder with PROM Pain Descriptors / Indicators: Guarding;Grimacing Pain Intervention(s): Monitored during session;Repositioned  Home Living                                          Prior Functioning/Environment              Frequency  Min 1X/week        Progress Toward Goals  OT Goals(current goals can now be found in the care plan section)  Progress towards OT goals: Not progressing toward goals - comment(lethargy)  Acute Rehab OT Goals Patient Stated Goal: "leave me alone and let me rest" OT  Goal Formulation: Patient unable to participate in goal setting Time For Goal Achievement: 01/30/19 Potential to Achieve Goals: [place] Discharge plan remains appropriate    Co-evaluation                 AM-PAC OT "6 Clicks" Daily Activity     Outcome Measure   Help from another person eating meals?: Total Help from another person taking care of personal grooming?: Total Help from another person toileting, which includes using toliet, bedpan, or urinal?: Total Help from another person bathing (including washing, rinsing, drying)?: Total Help from another person to put on and taking off regular upper body clothing?: Total Help from another person to put on and taking off regular lower body clothing?: Total 6 Click Score: 6    End of Session    OT Visit Diagnosis: Muscle weakness (generalized) (M62.81);Cognitive communication deficit (R41.841)   Activity Tolerance Patient limited by fatigue   Patient Left in bed;with SCD's reapplied;with restraints reapplied;with nursing/sitter in room;with call bell/phone within reach   Nurse Communication          Time: 6761-9509 OT Time Calculation (min): 17  min  Charges: OT General Charges $OT Visit: 1 Visit OT Treatments $Therapeutic Exercise: 8-22 mins  Nestor Lewandowsky, OTR/L Acute Rehabilitation Services Pager: [phone] Office: [phone] Malka So 01/22/2019, 2:13 PM

## 2019-01-23 LAB — CBC WITH DIFFERENTIAL/PLATELET
Abs Immature Granulocytes: 0.07 10*3/uL (ref 0.00–0.07)
Basophils Absolute: 0.1 10*3/uL (ref 0.0–0.1)
Basophils Relative: 0 %
Eosinophils Absolute: 0.3 10*3/uL (ref 0.0–0.5)
Eosinophils Relative: 2 %
HCT: 25.5 % — ABNORMAL LOW (ref 39.0–52.0)
Hemoglobin: 7.6 g/dL — ABNORMAL LOW (ref 13.0–17.0)
Immature Granulocytes: 1 %
Lymphocytes Relative: 16 %
Lymphs Abs: 2.4 10*3/uL (ref 0.7–4.0)
MCH: 23.2 pg — ABNORMAL LOW (ref 26.0–34.0)
MCHC: 29.8 g/dL — ABNORMAL LOW (ref 30.0–36.0)
MCV: 77.7 fL — ABNORMAL LOW (ref 80.0–100.0)
Monocytes Absolute: 0.9 10*3/uL (ref 0.1–1.0)
Monocytes Relative: 6 %
Neutro Abs: 10.8 10*3/uL — ABNORMAL HIGH (ref 1.7–7.7)
Neutrophils Relative %: 75 %
Platelets: 518 10*3/uL — ABNORMAL HIGH (ref 150–400)
RBC: 3.28 MIL/uL — ABNORMAL LOW (ref 4.22–5.81)
RDW: 16.8 % — ABNORMAL HIGH (ref 11.5–15.5)
WBC: 14.5 10*3/uL — ABNORMAL HIGH (ref 4.0–10.5)
nRBC: 0 % (ref 0.0–0.2)

## 2019-01-23 LAB — GLUCOSE, CAPILLARY
Glucose-Capillary: 158 mg/dL — ABNORMAL HIGH (ref 70–99)
Glucose-Capillary: 160 mg/dL — ABNORMAL HIGH (ref 70–99)
Glucose-Capillary: 167 mg/dL — ABNORMAL HIGH (ref 70–99)
Glucose-Capillary: 172 mg/dL — ABNORMAL HIGH (ref 70–99)
Glucose-Capillary: 183 mg/dL — ABNORMAL HIGH (ref 70–99)
Glucose-Capillary: 192 mg/dL — ABNORMAL HIGH (ref 70–99)

## 2019-01-23 LAB — SARS CORONAVIRUS 2 (TAT 6-24 HRS): SARS Coronavirus 2: NEGATIVE

## 2019-01-23 NOTE — Progress Notes (Signed)
Pharmacy Antibiotic Note  Julian Tyler is a 73 y.o. male admitted on 12/11/2018 with code stroke due to left side weakness, MRI was negative.  Pharmacy has been consulted for meropenem dosing due to Pseudomonas and E.coli UTI.  He had a positive urinalysis on 8/6, WBC is elevated at 14.5 and he remains afebrile.  Renal function is stable.  Plan: Continue [place] will sign off as dosage adjustment is likely unnecessary.  Thank you for the consult!   Consider adding stop date (today is day #4 of therapy - noted plan for 7 days, shorter course is recommended with seizure history)   Weight: 266 lb (120.7 kg)  Temp (24hrs), Avg:98.4 F (36.9 C), Min:98 F (36.7 C), Max:98.7 F (37.1 C)  Recent Labs  Lab 01/17/19 0430  01/19/19 0836 01/20/19 0824 01/21/19 0621 01/22/19 0322 01/23/19 0813  WBC 14.0*   < > 14.8* 13.2* 13.6* 14.2* 14.5*  CREATININE 0.73  --   --   --  0.66 0.69  --    < > = values in this interval not displayed.    Estimated Creatinine Clearance: 105.4 mL/min (by C-G formula based on SCr of 0.69 mg/dL).    CTX 8/7 >> 8/9 Merrem 8/9 >>  6/30 and 7/8 BCx - negative 7/1 UCx - negative 8/6 UCx - Pseu (S Cipro, Primaxin) + 20K E.coli (S Ancef, Primaxin, Unasyn)  Julian Tyler D. Mina Marble, PharmD, BCPS, [place] 01/23/2019, 10:17 AM

## 2019-01-23 NOTE — Plan of Care (Signed)
  Problem: Pain Managment: Goal: General experience of comfort will improve Outcome: Progressing   Problem: Activity: Goal: Risk for activity intolerance will decrease Outcome: Progressing   Problem: Nutrition: Goal: Risk of aspiration will decrease Outcome: Progressing Goal: Dietary intake will improve Outcome: Progressing

## 2019-01-23 NOTE — Progress Notes (Signed)
PROGRESS NOTE    Julian Tyler  WFU:[id number] DOB: [birth year] DOA: 12/11/2018 PCP: Milana Na., MD   Brief Narrative:  Patient is a 73 year old man complex past medical history including stroke with residual right-sided deficits, diabetes mellitus, dementia, recent complex hospitalization where he was treated for seizures and delirium in the setting of dementia who presented to the emergency department 6/30 as a code stroke, reported left facial droop and left arm weakness.  Stat EEG did not show seizure.  MRI negative.  Admitted for acute metabolic encephalopathy, hypernatremia, dehydration, acute kidney injury, possible UTI.  Seen by neurology but no specific neurologic diagnoses made.  Symptoms attributed to hypernatremia.  However the patient didnot improve despite correction of hypernatremia.  Hospitalization was complicated by prolonged encephalopathy which waxes and wanes.  Patient unable to swallow and has remained n.p.o.  After discussion with family, feeding tube was temporarily placed and tube feeds initiated.  Patient continued to have waxing and waning of his mental status and is at times awake and alert and follows commands at other times hardly participates with examination. Finally after prolonged hospitalization and rigorous discussion with the son by me and palliative care ,son  agreed for PEG tube placement.  He underwent PEG tube placement.  He was planned for discharge but developed fever and leukocytosis and found to have E. coli and Pseudomonas UTI.  Started on meropenem.  Will wait for next 12 to 24 hours for IV antibiotics before discharge to skilled nursing facility.  Social worker following.  He has a bed at a skilled nursing facility.   Assessment & Plan:   Principal Problem:   Acute metabolic encephalopathy Active Problems:   Goals of care, counseling/discussion   Severe protein-calorie malnutrition (HCC)   Anemia   Palliative care by specialist  Vascular dementia without behavioral disturbance ([place])   Seizure ([place])   Dysphagia   Adult failure to thrive   Type 2 diabetes mellitus without complication (HCC)   Pressure injury of skin   Aspiration into airway   PEG (percutaneous endoscopic gastrostomy) status (HCC)  Fever/leukocytosis/Pseudomonas/E. coli UTI: Became febrile with leukocytosis.  Currently afebrile since yesterday.  Chest x-ray did not show any pneumonia.  Respiratory status stable.  Urine culture showed Pseudomonas and E. coli.  Currently on imipenem.  Most likely antibiotics can be changed to oral tomorrow.  Plan for discharge tomorrow if he remains afebrile  Acute metabolic encephalopathy: Patient has history of baseline dementia.  Acute encephalopathy thought to be secondary to dehydration/hypernatremia. Sodium was 158 on admission.  Patient was also on multiple psych medications prior to admission.  ABG, ammonia were unremarkable.  EEG unremarkable.  MRI of brain on admission was negative.  Mental status continues to wax and wane. Etiology remains obscure.  Acute worsening of dementia is possibility. He follows commands,communicates,remains sleepy most of the time but oriented only to self.   Seizure disorder: No seizure reported.  EEG did not show any seizure.  Continue Keppra, Vimpat on current doses.   Diabetes type 2: Continue sliding-scale insulin .  Continue monitoring CBC  Hypertension: BP fluctuates.  Continue current medicines.Monitor BP  History of vascular dementia with behavioral disturbance: Avoid sedatives.  Continue to monitor mental status  Severe protein calorie malnutrition/failure to thrive: Failed his swallowing evaluation multiple times. Underwent PEG tube placement.  Borderline microcytic anemia: Likely associated with malnutrition,poor oral intake.No evidence of blood loss.Hb has remained stable . Low iron of 14. Continue iron supplementation.  Abnormal echocardiogram with possible  mobile  density on aortic valve: Blood cultures did not show any growth.  TEE deferred due to patient's poor clinical status.  Patient's family also do not want to pursue any further testing.  Positive RPR/Treponema pallidum antibodies: Discussed with ID.  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  ID thought that this most likely false positive test. no indication for current treatment.  Abnormal serum/protein electrophoresis: No M spike. No further evaluation.  Discussed with oncology.  Acute kidney injury/hypernatremia: Resolved  History of CVA: Has weakness on the right side, slurred speech at baseline  Goals of care: Multiple discussion held with son.Remains full code.   Disposition: SNF     Nutrition Problem: Inadequate oral intake Etiology: inability to eat      DVT prophylaxis: Lovenox Code Status: Full Family Communication: SNone Disposition Plan: Skilled Nursing facility tomorrow Consultants: Neurology, palliative medicine  Procedures: Echocardiogram, EEG  Antimicrobials:  Anti-infectives (From admission, onward)   Start     Dose/Rate Route Frequency Ordered Stop   01/20/19 1400  meropenem (MERREM) 1 g in sodium chloride 0.9 % 100 mL IVPB     1 g 200 mL/hr over 30 Minutes Intravenous Every 8 hours 01/20/19 1152     01/18/19 0800  cefTRIAXone (ROCEPHIN) 1 g in sodium chloride 0.9 % 100 mL IVPB  Status:  Discontinued     1 g 200 mL/hr over 30 Minutes Intravenous Every 24 hours 01/18/19 0717 01/20/19 1122   01/09/19 1230  ceFAZolin (ANCEF) IVPB 2g/100 mL premix     2 g 200 mL/hr over 30 Minutes Intravenous To Radiology 01/09/19 1215 01/09/19 1302   01/07/19 0000  ceFAZolin (ANCEF) IVPB 2g/100 mL premix     2 g 200 mL/hr over 30 Minutes Intravenous To Radiology 01/04/19 1336 01/07/19 0015   12/12/18 2100  cefTRIAXone (ROCEPHIN) 1 g in sodium chloride 0.9 % 100 mL IVPB  Status:  Discontinued     1 g 200 mL/hr over 30 Minutes Intravenous Every 24 hours 12/12/18 2052 12/15/18 1932       Subjective:  Seen and examined the bedside this morning.  Hemodynamically stable.  Comfortable.  Alert and awake but oriented to self.  Afebrile today.  Denies any complaints  Objective: Vitals:   01/22/19 1940 01/22/19 2327 01/23/19 0335 01/23/19 0813  BP: 122/67 (!) 151/81 127/61 (!) 147/69  Pulse: 89 99 81 96  Resp: 18 18 18 16   Temp: 98 F (36.7 C) 98.7 F (37.1 C) 98.5 F (36.9 C) 98.7 F (37.1 C)  TempSrc: Oral Oral Oral Oral  SpO2: 99% 98% 97% 99%  Weight:   120.7 kg     Intake/Output Summary (Last 24 hours) at 01/23/2019 0916 Last data filed at 01/23/2019 8295 Gross per 24 hour  Intake 1676 ml  Output 1700 ml  Net -24 ml   Filed Weights   01/21/19 0500 01/22/19 0352 01/23/19 0335  Weight: 121.1 kg 110.2 kg 120.7 kg    Examination:  General exam: Not in distress, chronically ill looking, generalized weakness, obese  HEENT:PERRL,Oral mucosa moist, Ear/Nose normal on gross exam,feeding tube Respiratory system: Bilateral equal air entry, normal vesicular breath sounds, no wheezes or crackles  Cardiovascular system: S1 & S2 heard, RRR. No JVD, murmurs, rubs, gallops or clicks. Gastrointestinal system: Abdomen is mildly distended, soft and nontender. No organomegaly or masses felt. Normal bowel sounds heard. PEG tube. Central nervous system:   Becomes awake and alert on calling his name.  Knows that he is in the hospital.  Not oriented to time or person Extremities: Trace edema, no clubbing ,no cyanosis Skin: Some skin breakdowns on bilateral foot   Data Reviewed: I have personally reviewed following labs and imaging studies  CBC: Recent Labs  Lab 01/19/19 0836 01/20/19 0824 01/21/19 0621 01/22/19 0322 01/23/19 0813  WBC 14.8* 13.2* 13.6* 14.2* 14.5*  NEUTROABS  --   --   --   --  10.8*  HGB 7.6* 7.6* 7.8* 7.6* 7.6*  HCT 25.3* 25.5* 26.4* 25.3* 25.5*  MCV 77.4* 77.7* 77.9* 77.1* 77.7*  PLT 462* 468* 449* 470* 341*   Basic Metabolic Panel: Recent  Labs  Lab 01/17/19 0430 01/21/19 0621 01/22/19 0322  NA 135 139  --   K 3.9 3.8  --   CL 102 106  --   CO2 26 23  --   GLUCOSE 191* 177*  --   BUN 16 14  --   CREATININE 0.73 0.66 0.69  CALCIUM 8.8* 8.9  --    GFR: Estimated Creatinine Clearance: 105.4 mL/min (by C-G formula based on SCr of 0.69 mg/dL). Liver Function Tests: No results for input(s): AST, ALT, ALKPHOS, BILITOT, PROT, ALBUMIN in the last 168 hours. No results for input(s): LIPASE, AMYLASE in the last 168 hours. No results for input(s): AMMONIA in the last 168 hours. Coagulation Profile: No results for input(s): INR, PROTIME in the last 168 hours. Cardiac Enzymes: No results for input(s): CKTOTAL, CKMB, CKMBINDEX, TROPONINI in the last 168 hours. BNP (last 3 results) No results for input(s): PROBNP in the last 8760 hours. HbA1C: No results for input(s): HGBA1C in the last 72 hours. CBG: Recent Labs  Lab 01/22/19 1629 01/22/19 1938 01/22/19 2326 01/23/19 0334 01/23/19 0824  GLUCAP 150* 161* 140* 167* 192*   Lipid Profile: No results for input(s): CHOL, HDL, LDLCALC, TRIG, CHOLHDL, LDLDIRECT in the last 72 hours. Thyroid Function Tests: No results for input(s): TSH, T4TOTAL, FREET4, T3FREE, THYROIDAB in the last 72 hours. Anemia Panel: No results for input(s): VITAMINB12, FOLATE, FERRITIN, TIBC, IRON, RETICCTPCT in the last 72 hours. Sepsis Labs: No results for input(s): PROCALCITON, LATICACIDVEN in the last 168 hours.  Recent Results (from the past 240 hour(s))  SARS Coronavirus 2 [facility] order, Performed in [place] hospital lab)     Status: None   Collection Time: 01/14/19 11:20 AM  Result Value Ref Range Status   SARS Coronavirus 2 NEGATIVE NEGATIVE Final    Comment: (NOTE) If result is NEGATIVE SARS-CoV-2 target nucleic acids are NOT DETECTED. The SARS-CoV-2 RNA is generally detectable in upper and lower  respiratory specimens during the acute phase of infection. The lowest  concentration  of SARS-CoV-2 viral copies this assay can detect is 250  copies / mL. A negative result does not preclude SARS-CoV-2 infection  and should not be used as the sole basis for treatment or other  patient management decisions.  A negative result may occur with  improper specimen collection / handling, submission of specimen other  than nasopharyngeal swab, presence of viral mutation(s) within the  areas targeted by this assay, and inadequate number of viral copies  (<250 copies / mL). A negative result must be combined with clinical  observations, patient history, and epidemiological information. If result is POSITIVE SARS-CoV-2 target nucleic acids are DETECTED. The SARS-CoV-2 RNA is generally detectable in upper and lower  respiratory specimens dur ing the acute phase of infection.  Positive  results are indicative of active infection with SARS-CoV-2.  Clinical  correlation with patient history  and other diagnostic information is  necessary to determine patient infection status.  Positive results do  not rule out bacterial infection or co-infection with other viruses. If result is PRESUMPTIVE POSTIVE SARS-CoV-2 nucleic acids MAY BE PRESENT.   A presumptive positive result was obtained on the submitted specimen  and confirmed on repeat testing.  While 2019 novel coronavirus  (SARS-CoV-2) nucleic acids may be present in the submitted sample  additional confirmatory testing may be necessary for epidemiological  and / or clinical management purposes  to differentiate between  SARS-CoV-2 and other Sarbecovirus currently known to infect humans.  If clinically indicated additional testing with an alternate test  methodology [phone]) is advised. The SARS-CoV-2 RNA is generally  detectable in upper and lower respiratory sp ecimens during the acute  phase of infection. The expected result is Negative. Fact Sheet for Patients:  StrictlyIdeas.no Fact Sheet for Healthcare  Providers: BankingDealers.co.za This test is not yet approved or cleared by the [place] FDA and has been authorized for detection and/or diagnosis of SARS-CoV-2 by FDA under an Emergency Use Authorization (EUA).  This EUA will remain in effect (meaning this test can be used) for the duration of the COVID-19 declaration under Section 564(b)(1) of the Act, 21 U.S.C. section 360bbb-3(b)(1), unless the authorization is terminated or revoked sooner. Performed at [facility], [place] [address], [place], [place] [address]   Culture, Urine     Status: Abnormal   Collection Time: 01/17/19  3:27 AM   Specimen: Urine, Clean Catch  Result Value Ref Range Status   Specimen Description URINE, CLEAN CATCH  Final   Special Requests   Final    NONE Performed at [facility], [place] [address], [place], [place] [address]    Culture (A)  Final    >=100,000 COLONIES/mL PSEUDOMONAS AERUGINOSA 20,000 COLONIES/mL ESCHERICHIA COLI    Report Status 01/20/2019 FINAL  Final   Organism ID, Bacteria PSEUDOMONAS AERUGINOSA (A)  Final   Organism ID, Bacteria ESCHERICHIA COLI (A)  Final      Susceptibility   Escherichia coli - MIC*    AMPICILLIN >=32 RESISTANT Resistant     CEFAZOLIN <=4 SENSITIVE Sensitive     CEFTRIAXONE <=1 SENSITIVE Sensitive     CIPROFLOXACIN >=4 RESISTANT Resistant     GENTAMICIN <=1 SENSITIVE Sensitive     IMIPENEM <=0.25 SENSITIVE Sensitive     NITROFURANTOIN <=16 SENSITIVE Sensitive     TRIMETH/SULFA <=20 SENSITIVE Sensitive     AMPICILLIN/SULBACTAM 8 SENSITIVE Sensitive     PIP/TAZO <=4 SENSITIVE Sensitive     Extended ESBL NEGATIVE Sensitive     * 20,000 COLONIES/mL ESCHERICHIA COLI   Pseudomonas aeruginosa - MIC*    CEFTAZIDIME 16 INTERMEDIATE Intermediate     CIPROFLOXACIN <=0.25 SENSITIVE Sensitive     GENTAMICIN <=1 SENSITIVE Sensitive     IMIPENEM 1 SENSITIVE Sensitive     CEFEPIME INTERMEDIATE Intermediate     * >=100,000 COLONIES/mL  PSEUDOMONAS AERUGINOSA  SARS CORONAVIRUS 2 Nasal Swab Aptima Multi Swab     Status: None   Collection Time: 01/19/19 12:27 PM   Specimen: Aptima Multi Swab; Nasal Swab  Result Value Ref Range Status   SARS Coronavirus 2 NEGATIVE NEGATIVE Final    Comment: (NOTE) SARS-CoV-2 target nucleic acids are NOT DETECTED. The SARS-CoV-2 RNA is generally detectable in upper and lower respiratory specimens during the acute phase of infection. Negative results do not preclude SARS-CoV-2 infection, do not rule out co-infections with other pathogens, and  should not be used as the sole basis for treatment or other patient management decisions. Negative results must be combined with clinical observations, patient history, and epidemiological information. The expected result is Negative. Fact Sheet for Patients: SugarRoll.be Fact Sheet for Healthcare Providers: https://www.woods-mathews.com/ This test is not yet approved or cleared by the [place] FDA and  has been authorized for detection and/or diagnosis of SARS-CoV-2 by FDA under an Emergency Use Authorization (EUA). This EUA will remain  in effect (meaning this test can be used) for the duration of the COVID-19 declaration under Section 56 4(b)(1) of the Act, 21 U.S.C. section 360bbb-3(b)(1), unless the authorization is terminated or revoked sooner. Performed at [facility], [place] [address] [place], [place] [address]          Radiology Studies: No results found.      Scheduled Meds: . amLODipine  10 mg Per Tube Daily  . aspirin  325 mg Per Tube Daily  . chlorhexidine  15 mL Mouth Rinse BID  . dorzolamide  1 drop Both Eyes BID  . feeding supplement (PRO-STAT SUGAR FREE 64)  30 mL Per Tube Daily  . ferrous sulfate  300 mg Per Tube BID WC  . folic acid  2 mg Per Tube Daily  . free water  200 mL Per Tube Q8H  . hydrALAZINE  100 mg Per Tube BID  . insulin aspart  0-9 Units  Subcutaneous Q4H  . lacosamide  50 mg Per Tube BID  . levETIRAcetam  500 mg Per Tube BID  . lisinopril  20 mg Per Tube Daily  . mouth rinse  15 mL Mouth Rinse q12n4p  . metoprolol tartrate  25 mg Per Tube BID  . nicotine  14 mg Transdermal Daily  . OLANZapine  5 mg Per Tube QHS  . polyethylene glycol  17 g Per Tube BID  . senna  1 tablet Per Tube QHS  . silodosin  8 mg Oral Daily  . sodium chloride flush  10-40 mL Intracatheter Q12H   Continuous Infusions: . sodium chloride 10 mL/hr at 01/20/19 1200  . feeding supplement (JEVITY 1.5 CAL/FIBER) 1,000 mL (01/23/19 0604)  . meropenem (MERREM) IV 1 g (01/23/19 0601)     LOS: 42 days    Time spent: 35 mins.More than 50% of that time was spent in counseling and/or coordination of care.      Shelly Coss, MD [facility] Pager [phone]  If 7PM-7AM, please contact night-coverage [email or web address] Password [facility] 01/23/2019, 9:16 AM

## 2019-01-24 LAB — CBC WITH DIFFERENTIAL/PLATELET
Abs Immature Granulocytes: 0.09 10*3/uL — ABNORMAL HIGH (ref 0.00–0.07)
Basophils Absolute: 0.1 10*3/uL (ref 0.0–0.1)
Basophils Relative: 0 %
Eosinophils Absolute: 0.3 10*3/uL (ref 0.0–0.5)
Eosinophils Relative: 2 %
HCT: 26.8 % — ABNORMAL LOW (ref 39.0–52.0)
Hemoglobin: 7.9 g/dL — ABNORMAL LOW (ref 13.0–17.0)
Immature Granulocytes: 1 %
Lymphocytes Relative: 21 %
Lymphs Abs: 3 10*3/uL (ref 0.7–4.0)
MCH: 22.9 pg — ABNORMAL LOW (ref 26.0–34.0)
MCHC: 29.5 g/dL — ABNORMAL LOW (ref 30.0–36.0)
MCV: 77.7 fL — ABNORMAL LOW (ref 80.0–100.0)
Monocytes Absolute: 0.9 10*3/uL (ref 0.1–1.0)
Monocytes Relative: 6 %
Neutro Abs: 9.6 10*3/uL — ABNORMAL HIGH (ref 1.7–7.7)
Neutrophils Relative %: 70 %
Platelets: 519 10*3/uL — ABNORMAL HIGH (ref 150–400)
RBC: 3.45 MIL/uL — ABNORMAL LOW (ref 4.22–5.81)
RDW: 17 % — ABNORMAL HIGH (ref 11.5–15.5)
WBC: 13.9 10*3/uL — ABNORMAL HIGH (ref 4.0–10.5)
nRBC: 0 % (ref 0.0–0.2)

## 2019-01-24 LAB — GLUCOSE, CAPILLARY
Glucose-Capillary: 168 mg/dL — ABNORMAL HIGH (ref 70–99)
Glucose-Capillary: 193 mg/dL — ABNORMAL HIGH (ref 70–99)
Glucose-Capillary: 146 mg/dL — ABNORMAL HIGH (ref 70–99)

## 2019-01-24 MED ORDER — SILODOSIN 8 MG PO CAPS: 8.0000 mg | ORAL_CAPSULE | Freq: Every day | ORAL | 0 refills | Status: AC

## 2019-01-24 MED ORDER — LACOSAMIDE 50 MG PO TABS: 50.0000 mg | ORAL_TABLET | Freq: Two times a day (BID) | ORAL | 0 refills | Status: AC

## 2019-01-24 MED ORDER — OLANZAPINE 5 MG PO TABS: 5.0000 mg | ORAL_TABLET | Freq: Every day | ORAL | 0 refills | Status: AC

## 2019-01-24 MED ORDER — FOLIC ACID 1 MG PO TABS: 2.0000 mg | ORAL_TABLET | Freq: Every day | ORAL | 0 refills | Status: AC

## 2019-01-24 NOTE — TOC Transition Note (Signed)
Transition of Care [facility]) - CM/SW Discharge Note   Patient Details  Name: Kennet Mccort MRN: [id number] Date of Birth: 06/08/1946  Transition of [facility]) CM/SW Contact:  Pollie Friar, RN Phone Number: 01/24/2019, 1:33 PM   Clinical Narrative:    Pt discharging to [facility] today. CM attempted to reach son without success. CM was able to speak to [facility] and she is aware of d/c.  PTAR to provide transportation. D/c packet at the desk. Bedside RN updated.   Room at [facility]: [place] Number for report: [phone] ask for 400 hall RN   Final next level of care: Skilled Nursing Facility Barriers to Discharge: No Barriers Identified   Patient Goals and CMS Choice Patient states their goals for this hospitalization and ongoing recovery are:: patient unable to participate in goal setting CMS Medicare.gov Compare Post Acute Care list provided to:: Patient Represenative (must comment) Choice offered to / list presented to : Adult Children  Discharge Placement              Patient chooses bed at: [facility] Patient to be transferred to facility by: [place] Name of family member notified: Estill Bamberg Patient and family notified of of transfer: 01/24/19  Discharge Plan and Services     Post Acute Care Choice: [place]                               Social Determinants of Health (SDOH) Interventions     Readmission Risk Interventions No flowsheet data found.

## 2019-01-24 NOTE — Discharge Summary (Addendum)
Physician Discharge Summary  Julian Tyler OFB:[id number] DOB: 1946-04-25 DOA: 12/11/2018  PCP: Milana Na., MD  Admit date: 12/11/2018 Discharge date: 01/24/2019  Admitted From: Home Disposition:  Home  Discharge Condition:Stable CODE STATUS:FULL Diet recommendation: Tube feeding  Brief/Interim Summary:  Patient is a 73 year old man complex past medical history including stroke with residual right-sided deficits, diabetes mellitus, dementia, recent complex hospitalization where he was treated for seizures and delirium in the setting of dementia who presented to the emergency department 6/30 as a code stroke, reported left facial droop and left arm weakness. Stat EEG did not show seizure. MRI negative. Admitted for acute metabolic encephalopathy, hypernatremia, dehydration, acute kidney injury, possible UTI. Seen by neurology but no specific neurologic diagnoses made. Symptoms attributed to hypernatremia. However the patient didnot improve despite correction of hypernatremia.Hospitalization was complicated by prolonged encephalopathy which waxes and wanes. Patient unable to swallow and has remained n.p.o. After discussion with family, feeding tube was temporarily placed and tube feeds initiated. Patient continued to have waxing and waning of his mental status and is at times awake and alert and follows commands at other times hardly participates with examination. Finally after prolonged hospitalization and rigorous discussion with the son by me and palliative care ,son  agreed for PEG tube placement.  He underwent PEG tube placement.   He has a bed at a skilled nursing facility.He is hemodynamically stable for discharge.  Following problems were addressed during his hospitalization:   Fever/leukocytosis/Pseudomonas/E. coli UTI: Became febrile with leukocytosis.  Currently afebrile since last 2 hours.  Chest x-ray did not show any pneumonia.  Respiratory status stable.  Urine  culture showed Pseudomonas and E. coli.  Was treated with imipenem.Completed antibiotics course.  Acute metabolic encephalopathy: Patient has history of baseline dementia.  Acute encephalopathy thought to be secondary to dehydration/hypernatremia. Sodium was 158 on admission.  Patient was also on multiple psych medications prior to admission.  ABG, ammonia were unremarkable.  EEG unremarkable.  MRI of brain on admission was negative.  Mental status continues to wax and wane. Etiology remains obscure.  Acute worsening of dementia is possibility. He follows commands,communicates,remains sleepy most of the time but oriented only to self.   Seizure disorder: No seizure reported.  EEG did not show any seizure.  Continue Keppra, Vimpat on current doses.   Diabetes type 2: Continue metformin  Hypertension: BP fluctuates.  Continue current medicines.Monitor BP  History of vascular dementia with behavioral disturbance: Avoid sedatives.  Started on zyprexa for agitation.  Severe protein calorie malnutrition/failure to thrive: Failed his swallowing evaluation multiple times. Underwent PEG tube placement.On tube feeding.Continue feeding at 50 ml/hr  Borderline microcytic anemia: Likely associated with malnutrition,poor oral intake.No evidence of blood loss.Hb has remained stable . Low iron of 14. Continue iron supplementation.  Abnormal echocardiogram with possible mobile density on aortic valve: Blood cultures did not show any growth.  TEE deferred due to patient's poor clinical status.  Patient's family also do not want to pursue any further testing.  Positive RPR/Treponema pallidum antibodies: Discussed with ID.  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  ID thought that this most likely false positive test. no indication for current treatment.  Abnormal serum/protein electrophoresis: No M spike. No further evaluation.  Discussed with oncology.  Acute kidney injury/hypernatremia:  Resolved  History of CVA: Has weakness on the right side, slurred speech at baseline  Diarrhoea: Noted to have loose stools today.  MiraLAX held.  Please hold MiraLAX if continues to have loose stools  Goals of care: Multiple discussion held with son.Remains full code.  Discharge Diagnoses:  Principal Problem:   Acute metabolic encephalopathy Active Problems:   Goals of care, counseling/discussion   Severe protein-calorie malnutrition ([place])   Anemia   Palliative care by specialist   Vascular dementia without behavioral disturbance ([place])   Seizure ([place])   Dysphagia   Adult failure to thrive   Type 2 diabetes mellitus without complication (HCC)   Pressure injury of skin   Aspiration into airway   PEG (percutaneous endoscopic gastrostomy) status [facility])    Discharge Instructions  Discharge Instructions    Discharge instructions   Complete by: As directed    1)Take prescribed medicines as instructed. 2)Do a CBC and BMP tests in a week.   Increase activity slowly   Complete by: As directed      Allergies as of 01/24/2019      Reactions   Hctz [hydrochlorothiazide] Swelling   Sulfa Drugs Cross Reactors Swelling   Zithromax [azithromycin] Diarrhea   Robaxin [methocarbamol] Other (See Comments)   Per MAR      Medication List    STOP taking these medications   ALPRAZolam 0.5 MG tablet Commonly known as: Xanax   aspirin EC 81 MG tablet Replaced by: aspirin 325 MG tablet   cloNIDine 0.3 mg/24hr patch Commonly known as: CATAPRES - Dosed in mg/24 hr   divalproex 250 MG DR tablet Commonly known as: DEPAKOTE   ferrous sulfate 325 (65 FE) MG tablet Replaced by: ferrous sulfate 300 (60 Fe) MG/5ML syrup   HumaLOG 100 UNIT/ML cartridge Generic drug: insulin lispro   Lantus SoloStar 100 UNIT/ML Solostar Pen Generic drug: Insulin Glargine   levETIRAcetam 1000 MG tablet Commonly known as: KEPPRA Replaced by: levETIRAcetam 100 MG/ML solution   metFORMIN 500 MG 24 hr  tablet Commonly known as: GLUCOPHAGE-XR Replaced by: metFORMIN 500 MG tablet   metoprolol succinate 25 MG 24 hr tablet Commonly known as: TOPROL-XL   potassium chloride 10 MEQ tablet Commonly known as: K-DUR   QUEtiapine 50 MG tablet Commonly known as: SEROQUEL     TAKE these medications   amLODipine 10 MG tablet Commonly known as: NORVASC Place 1 tablet (10 mg total) into feeding tube daily. What changed: how to take this   aspirin 325 MG tablet Place 1 tablet (325 mg total) into feeding tube daily. Replaces: aspirin EC 81 MG tablet   [employer] Nicotine 21 mg/24hr patch Generic drug: nicotine Place 21 mg onto the skin daily. Place 1 patch onto skin daily Remove old patch prior to placement of new patch   dorzolamide 2 % ophthalmic solution Commonly known as: TRUSOPT Place 1 drop into both eyes 2 (two) times daily.   feeding supplement (JEVITY 1.5 CAL/FIBER) Liqd Place 1,000 mLs into feeding tube continuous. What changed:   how much to take  how to take this  when to take this   feeding supplement (PRO-STAT SUGAR FREE 64) Liqd Place 30 mLs into feeding tube daily.   ferrous sulfate 300 (60 Fe) MG/5ML syrup Place 5 mLs (300 mg total) into feeding tube 2 (two) times daily with a meal. Replaces: ferrous sulfate 325 (65 FE) MG tablet   folic acid 1 MG tablet Commonly known as: FOLVITE Place 2 tablets (2 mg total) into feeding tube daily. Start taking on: January 25, 2019   hydrALAZINE 100 MG tablet Commonly known as: APRESOLINE Place 1 tablet (100 mg total) into feeding tube 2 (two) times daily. What changed: how to take  this   lacosamide 50 MG Tabs tablet Commonly known as: VIMPAT Place 1 tablet (50 mg total) into feeding tube 2 (two) times daily. What changed:   medication strength  how much to take  how to take this  when to take this   levETIRAcetam 100 MG/ML solution Commonly known as: KEPPRA Place 5 mLs (500 mg total) into feeding tube 2 (two)  times daily. Replaces: levETIRAcetam 1000 MG tablet   lisinopril 20 MG tablet Commonly known as: ZESTRIL Place 1 tablet (20 mg total) into feeding tube daily. What changed: how to take this   Melatonin 3 MG Tabs Take 3 mg by mouth at bedtime as needed (sleep).   metFORMIN 500 MG tablet Commonly known as: Glucophage Take 1 tablet (500 mg total) by mouth 2 (two) times daily with a meal. Replaces: metFORMIN 500 MG 24 hr tablet   metoprolol tartrate 25 MG tablet Commonly known as: LOPRESSOR Place 1 tablet (25 mg total) into feeding tube 2 (two) times daily.   OLANZapine 5 MG tablet Commonly known as: ZYPREXA Place 1 tablet (5 mg total) into feeding tube at bedtime.   polyethylene glycol 17 g packet Commonly known as: MIRALAX / GLYCOLAX Place 17 g into feeding tube 2 (two) times daily.   pravastatin 40 MG tablet Commonly known as: PRAVACHOL Place 1 tablet (40 mg total) into feeding tube daily. What changed: how to take this   silodosin 8 MG Caps capsule Commonly known as: RAPAFLO Take 1 capsule (8 mg total) by mouth daily. Start taking on: January 25, 2019      Contact information for after-discharge care    Destination    [facility] .   Service: Skilled Nursing Contact information: [address] [place] [address] [phone]             Allergies  Allergen Reactions  . Hctz [Hydrochlorothiazide] Swelling  . Sulfa Drugs Cross Reactors Swelling  . Zithromax [Azithromycin] Diarrhea  . Robaxin [Methocarbamol] Other (See Comments)    Per MAR    Consultations:  Neurology   Procedures/Studies: Ct Abdomen Wo Contrast  Result Date: 01/03/2019 CLINICAL DATA:  Evaluate anatomy prior to potential percutaneous gastrostomy tube placement EXAM: CT ABDOMEN WITHOUT CONTRAST TECHNIQUE: Multidetector CT imaging of the abdomen was performed following the standard protocol without IV contrast. COMPARISON:  CT abdomen  pelvis-02/12/2016 FINDINGS: Evaluation of solid abdominal organs is degraded secondary to lack of intravenous contrast. Lower chest: Limited visualization of the lower thorax is negative for focal airspace opacity or pleural effusion. Normal heart size. Coronary artery calcifications. Trace amount of pericardial fluid, presumably physiologic. Hepatobiliary: Normal hepatic contour. Normal noncontrast appearance of the gallbladder given degree distention. No radiopaque gallstones. No ascites. Pancreas: Normal noncontrast appearance of the pancreas. Spleen: Normal noncontrast appearance of the spleen. Adrenals/Urinary Tract: Moderate amount of grossly symmetric likely age and body habitus related perinephric stranding. No evidence of urinary obstruction. No renal stones. There is mild thickening the bilateral adrenal glands without discrete nodule. The urinary bladder was not imaged. Stomach/Bowel: Enteric tube tip terminates within the duodenal bulb. There is no interposed hepatic parenchyma or transverse colon between the anterior wall of the distal aspect of the mid body of the stomach and ventral wall of the upper abdomen (image 26, series 3) and this percutaneous window would likely be improved with gaseous distention of the stomach. Tiny hiatal hernia. Moderate colonic stool burden without evidence of enteric obstruction. Normal noncontrast appearance of the imaged portions  of the appendix. No pneumoperitoneum, pneumatosis or portal venous gas. Vascular/Lymphatic: Atherosclerotic plaque within the abdominal aorta. No bulky retroperitoneal or mesenteric lymphadenopathy. Other: Diffuse body wall anasarca. Musculoskeletal: No acute or aggressive osseous abnormalities. Stigmata of DISH within the thoracic spine. IMPRESSION: 1. Gastric anatomy amenable to potential percutaneous gastrostomy tube placement as indicated. 2. Tiny hiatal hernia. 3. Coronary calcifications.  Aortic Atherosclerosis (ICD10-I70.0).  Electronically Signed   By: Sandi Mariscal M.D.   On: 01/03/2019 07:41   Ir Gastrostomy Tube Mod Sed  Result Date: 01/09/2019 INDICATION: 73 year old male with dysphagia EXAM: PERC PLACEMENT GASTROSTOMY MEDICATIONS: 2 g Ancef; Antibiotics were administered within 1 hour of the procedure. ANESTHESIA/SEDATION: Versed 2.0 mg IV; Fentanyl 100 mcg IV Moderate Sedation Time:  10 minutes The patient was continuously monitored during the procedure by the interventional radiology nurse under my direct supervision. CONTRAST:  10 cc-administered into the gastric lumen. FLUOROSCOPY TIME:  Fluoroscopy Time: 5 minutes 0 seconds COMPLICATIONS: None PROCEDURE: Informed written consent was obtained from the patient and the patient's family after a thorough discussion of the procedural risks, benefits and alternatives. All questions were addressed. Maximal Sterile Barrier Technique was utilized including caps, mask, sterile gowns, sterile gloves, sterile drape, hand hygiene and skin antiseptic. A timeout was performed prior to the initiation of the procedure. The epigastrium was prepped with Betadine in a sterile fashion, and a sterile drape was applied covering the operative field. A sterile gown and sterile gloves were used for the procedure. A 5-French orogastric tube is placed under fluoroscopic guidance. Scout imaging of the abdomen confirms barium within the transverse colon. The stomach was distended with gas. Under fluoroscopic guidance, an 18 gauge needle was utilized to puncture the anterior wall of the body of the stomach. An Amplatz wire was advanced through the needle passing a T fastener into the lumen of the stomach. The T fastener was secured for gastropexy. A 9-French sheath was inserted. A snare was advanced through the 9-French sheath. A Britta Mccreedy was advanced through the orogastric tube. It was snared then pulled out the oral cavity, pulling the snare, as well. The leading edge of the gastrostomy was attached to the  snare. It was then pulled down the esophagus and out the percutaneous site. Tube secured in place. Contrast was injected. Patient tolerated the procedure well and remained hemodynamically stable throughout. No complications were encountered and no significant blood loss encountered. IMPRESSION: Status post fluoroscopic placed percutaneous gastrostomy tube, with 20 [place] pull-through. Signed, Dulcy Fanny. Earleen Newport, DO [facility] Radiology Electronically Signed   By: Corrie Mckusick D.O.   On: 01/09/2019 13:51   Dg Chest Port 1 View  Result Date: 01/16/2019 CLINICAL DATA:  Fever EXAM: PORTABLE CHEST 1 VIEW COMPARISON:  12/29/2018 FINDINGS: Cardiomediastinal contours are stable. Calcific aortic knob. Linear right basilar atelectasis. No new focal airspace consolidation. No large pleural fluid collection. No pneumothorax. IMPRESSION: Mild right basilar atelectasis. Electronically Signed   By: Davina Poke M.D.   On: 01/16/2019 08:27   Dg Chest Port 1 View  Result Date: 12/29/2018 CLINICAL DATA:  Shortness of breath. Aspiration. EXAM: PORTABLE CHEST 1 VIEW COMPARISON:  Chest radiograph 12/12/2018 FINDINGS: Low lung volumes persist. Mild bibasilar atelectasis, similar to prior. No new airspace disease. Unchanged heart size and mediastinal contours. No pulmonary edema, large pleural effusion or pneumothorax. Unchanged osseous structures. IMPRESSION: Persistent low lung volumes with bibasilar atelectasis. No significant change from prior exam. Electronically Signed   By: Aurther Loft.D.  On: 12/29/2018 02:52   Dg Abd Portable 1v  Result Date: 12/28/2018 CLINICAL DATA:  73 year old male with enteric tube placement. EXAM: PORTABLE ABDOMEN - 1 VIEW COMPARISON:  Radiograph dated 12/25/2018 FINDINGS: There has been interval removal of previously seen feeding tube and placement of an enteric tube. The tip of the tube is in the right upper abdomen, likely in the  distal stomach. No bowel dilatation. Nonspecific bowel gas pattern. Degenerative changes of the spine. IMPRESSION: Enteric tube with tip in the distal stomach. Electronically Signed   By: Anner Crete M.D.   On: 12/28/2018 23:21      Subjective:  Patient seen and examined the bedside this morning.  His status has not changed since yesterday.  Remains comfortable.  Hemodynamically stable for discharge  Discharge Exam: Vitals:   01/24/19 0900 01/24/19 1100  BP: 138/74 (!) 150/75  Pulse: 84 94  Resp: 17 17  Temp: 98.4 F (36.9 C) 98.5 F (36.9 C)  SpO2: 99% 100%   Vitals:   01/23/19 2335 01/24/19 0330 01/24/19 0900 01/24/19 1100  BP: (!) 112/53 (!) 141/65 138/74 (!) 150/75  Pulse: 84 92 84 94  Resp: 15 15 17 17   Temp: 98.7 F (37.1 C) 98.8 F (37.1 C) 98.4 F (36.9 C) 98.5 F (36.9 C)  TempSrc: Oral Oral Axillary Axillary  SpO2: 100% 99% 99% 100%  Weight:        General: Pt is alert, awake, not in acute distress Cardiovascular: RRR, S1/S2 +, no rubs, no gallops Respiratory: CTA bilaterally, no wheezing, no rhonchi Abdominal: Soft, NT, ND, bowel sounds + Extremities: no edema, no cyanosis    The results of significant diagnostics from this hospitalization (including imaging, microbiology, ancillary and laboratory) are listed below for reference.     Microbiology: Recent Results (from the past 240 hour(s))  Culture, Urine     Status: Abnormal   Collection Time: 01/17/19  3:27 AM   Specimen: Urine, Clean Catch  Result Value Ref Range Status   Specimen Description URINE, CLEAN CATCH  Final   Special Requests   Final    NONE Performed at [facility], [address], [place], [place] [address]    Culture (A)  Final    >=100,000 COLONIES/mL PSEUDOMONAS AERUGINOSA 20,000 COLONIES/mL ESCHERICHIA COLI    Report Status 01/20/2019 FINAL  Final   Organism ID, Bacteria PSEUDOMONAS AERUGINOSA (A)  Final   Organism ID, Bacteria ESCHERICHIA COLI (A)  Final       Susceptibility   Escherichia coli - MIC*    AMPICILLIN >=32 RESISTANT Resistant     CEFAZOLIN <=4 SENSITIVE Sensitive     CEFTRIAXONE <=1 SENSITIVE Sensitive     CIPROFLOXACIN >=4 RESISTANT Resistant     GENTAMICIN <=1 SENSITIVE Sensitive     IMIPENEM <=0.25 SENSITIVE Sensitive     NITROFURANTOIN <=16 SENSITIVE Sensitive     TRIMETH/SULFA <=20 SENSITIVE Sensitive     AMPICILLIN/SULBACTAM 8 SENSITIVE Sensitive     PIP/TAZO <=4 SENSITIVE Sensitive     Extended ESBL NEGATIVE Sensitive     * 20,000 COLONIES/mL ESCHERICHIA COLI   Pseudomonas aeruginosa - MIC*    CEFTAZIDIME 16 INTERMEDIATE Intermediate     CIPROFLOXACIN <=0.25 SENSITIVE Sensitive     GENTAMICIN <=1 SENSITIVE Sensitive     IMIPENEM 1 SENSITIVE Sensitive     CEFEPIME INTERMEDIATE Intermediate     * >=100,000 COLONIES/mL PSEUDOMONAS AERUGINOSA  SARS CORONAVIRUS 2 Nasal Swab Aptima Multi Swab     Status: None  Collection Time: 01/19/19 12:27 PM   Specimen: Aptima Multi Swab; Nasal Swab  Result Value Ref Range Status   SARS Coronavirus 2 NEGATIVE NEGATIVE Final    Comment: (NOTE) SARS-CoV-2 target nucleic acids are NOT DETECTED. The SARS-CoV-2 RNA is generally detectable in upper and lower respiratory specimens during the acute phase of infection. Negative results do not preclude SARS-CoV-2 infection, do not rule out co-infections with other pathogens, and should not be used as the sole basis for treatment or other patient management decisions. Negative results must be combined with clinical observations, patient history, and epidemiological information. The expected result is Negative. Fact Sheet for Patients: SugarRoll.be Fact Sheet for Healthcare Providers: https://www.woods-mathews.com/ This test is not yet approved or cleared by the [place] FDA and  has been authorized for detection and/or diagnosis of SARS-CoV-2 by FDA under an Emergency Use Authorization (EUA).  This EUA will remain  in effect (meaning this test can be used) for the duration of the COVID-19 declaration under Section 56 4(b)(1) of the Act, 21 U.S.C. section 360bbb-3(b)(1), unless the authorization is terminated or revoked sooner. Performed at [facility], [place] [address] [place], [place] [address]   SARS CORONAVIRUS 2 Nasal Swab Aptima Multi Swab     Status: None   Collection Time: 01/23/19  3:13 PM   Specimen: Aptima Multi Swab; Nasal Swab  Result Value Ref Range Status   SARS Coronavirus 2 NEGATIVE NEGATIVE Final    Comment: (NOTE) SARS-CoV-2 target nucleic acids are NOT DETECTED. The SARS-CoV-2 RNA is generally detectable in upper and lower respiratory specimens during the acute phase of infection. Negative results do not preclude SARS-CoV-2 infection, do not rule out co-infections with other pathogens, and should not be used as the sole basis for treatment or other patient management decisions. Negative results must be combined with clinical observations, patient history, and epidemiological information. The expected result is Negative. Fact Sheet for Patients: SugarRoll.be Fact Sheet for Healthcare Providers: https://www.woods-mathews.com/ This test is not yet approved or cleared by the [place] FDA and  has been authorized for detection and/or diagnosis of SARS-CoV-2 by FDA under an Emergency Use Authorization (EUA). This EUA will remain  in effect (meaning this test can be used) for the duration of the COVID-19 declaration under Section 56 4(b)(1) of the Act, 21 U.S.C. section 360bbb-3(b)(1), unless the authorization is terminated or revoked sooner. Performed at [facility], [place] [address], [place], [place] [address]      Labs: BNP (last 3 results) No results for input(s): BNP in the last 8760 hours. Basic Metabolic Panel: Recent Labs  Lab 01/21/19 0621 01/22/19 0322  NA 139  --   K 3.8  --    CL 106  --   CO2 23  --   GLUCOSE 177*  --   BUN 14  --   CREATININE 0.66 0.69  CALCIUM 8.9  --    Liver Function Tests: No results for input(s): AST, ALT, ALKPHOS, BILITOT, PROT, ALBUMIN in the last 168 hours. No results for input(s): LIPASE, AMYLASE in the last 168 hours. No results for input(s): AMMONIA in the last 168 hours. CBC: Recent Labs  Lab 01/20/19 0824 01/21/19 0621 01/22/19 0322 01/23/19 0813 01/24/19 0347  WBC 13.2* 13.6* 14.2* 14.5* 13.9*  NEUTROABS  --   --   --  10.8* 9.6*  HGB 7.6* 7.8* 7.6* 7.6* 7.9*  HCT 25.5* 26.4* 25.3* 25.5* 26.8*  MCV 77.7* 77.9* 77.1* 77.7* 77.7*  PLT 468*  449* 470* 518* 519*   Cardiac Enzymes: No results for input(s): CKTOTAL, CKMB, CKMBINDEX, TROPONINI in the last 168 hours. BNP: Invalid input(s): POCBNP CBG: Recent Labs  Lab 01/23/19 2028 01/23/19 2341 01/24/19 0331 01/24/19 1001 01/24/19 1228  GLUCAP 160* 158* 168* 193* 146*   D-Dimer No results for input(s): DDIMER in the last 72 hours. Hgb A1c No results for input(s): HGBA1C in the last 72 hours. Lipid Profile No results for input(s): CHOL, HDL, LDLCALC, TRIG, CHOLHDL, LDLDIRECT in the last 72 hours. Thyroid function studies No results for input(s): TSH, T4TOTAL, T3FREE, THYROIDAB in the last 72 hours.  Invalid input(s): FREET3 Anemia work up No results for input(s): VITAMINB12, FOLATE, FERRITIN, TIBC, IRON, RETICCTPCT in the last 72 hours. Urinalysis    Component Value Date/Time   COLORURINE YELLOW 01/17/2019 0015   APPEARANCEUR HAZY (A) 01/17/2019 0015   LABSPEC 1.019 01/17/2019 0015   PHURINE 7.0 01/17/2019 0015   GLUCOSEU NEGATIVE 01/17/2019 0015   HGBUR NEGATIVE 01/17/2019 0015   BILIRUBINUR NEGATIVE 01/17/2019 0015   KETONESUR NEGATIVE 01/17/2019 0015   PROTEINUR NEGATIVE 01/17/2019 0015   NITRITE POSITIVE (A) 01/17/2019 0015   LEUKOCYTESUR MODERATE (A) 01/17/2019 0015   Sepsis Labs Invalid input(s): PROCALCITONIN,  WBC,   LACTICIDVEN Microbiology Recent Results (from the past 240 hour(s))  Culture, Urine     Status: Abnormal   Collection Time: 01/17/19  3:27 AM   Specimen: Urine, Clean Catch  Result Value Ref Range Status   Specimen Description URINE, CLEAN CATCH  Final   Special Requests   Final    NONE Performed at [facility], [place] [address], [place], [place] [address]    Culture (A)  Final    >=100,000 COLONIES/mL PSEUDOMONAS AERUGINOSA 20,000 COLONIES/mL ESCHERICHIA COLI    Report Status 01/20/2019 FINAL  Final   Organism ID, Bacteria PSEUDOMONAS AERUGINOSA (A)  Final   Organism ID, Bacteria ESCHERICHIA COLI (A)  Final      Susceptibility   Escherichia coli - MIC*    AMPICILLIN >=32 RESISTANT Resistant     CEFAZOLIN <=4 SENSITIVE Sensitive     CEFTRIAXONE <=1 SENSITIVE Sensitive     CIPROFLOXACIN >=4 RESISTANT Resistant     GENTAMICIN <=1 SENSITIVE Sensitive     IMIPENEM <=0.25 SENSITIVE Sensitive     NITROFURANTOIN <=16 SENSITIVE Sensitive     TRIMETH/SULFA <=20 SENSITIVE Sensitive     AMPICILLIN/SULBACTAM 8 SENSITIVE Sensitive     PIP/TAZO <=4 SENSITIVE Sensitive     Extended ESBL NEGATIVE Sensitive     * 20,000 COLONIES/mL ESCHERICHIA COLI   Pseudomonas aeruginosa - MIC*    CEFTAZIDIME 16 INTERMEDIATE Intermediate     CIPROFLOXACIN <=0.25 SENSITIVE Sensitive     GENTAMICIN <=1 SENSITIVE Sensitive     IMIPENEM 1 SENSITIVE Sensitive     CEFEPIME INTERMEDIATE Intermediate     * >=100,000 COLONIES/mL PSEUDOMONAS AERUGINOSA  SARS CORONAVIRUS 2 Nasal Swab Aptima Multi Swab     Status: None   Collection Time: 01/19/19 12:27 PM   Specimen: Aptima Multi Swab; Nasal Swab  Result Value Ref Range Status   SARS Coronavirus 2 NEGATIVE NEGATIVE Final    Comment: (NOTE) SARS-CoV-2 target nucleic acids are NOT DETECTED. The SARS-CoV-2 RNA is generally detectable in upper and lower respiratory specimens during the acute phase of infection. Negative results do not preclude SARS-CoV-2  infection, do not rule out co-infections with other pathogens, and should not be used as the sole basis for treatment or other patient management decisions. Negative  results must be combined with clinical observations, patient history, and epidemiological information. The expected result is Negative. Fact Sheet for Patients: SugarRoll.be Fact Sheet for Healthcare Providers: https://www.woods-mathews.com/ This test is not yet approved or cleared by the [place] FDA and  has been authorized for detection and/or diagnosis of SARS-CoV-2 by FDA under an Emergency Use Authorization (EUA). This EUA will remain  in effect (meaning this test can be used) for the duration of the COVID-19 declaration under Section 56 4(b)(1) of the Act, 21 U.S.C. section 360bbb-3(b)(1), unless the authorization is terminated or revoked sooner. Performed at [facility], [place] [address], [place], [place] [address]   SARS CORONAVIRUS 2 Nasal Swab Aptima Multi Swab     Status: None   Collection Time: 01/23/19  3:13 PM   Specimen: Aptima Multi Swab; Nasal Swab  Result Value Ref Range Status   SARS Coronavirus 2 NEGATIVE NEGATIVE Final    Comment: (NOTE) SARS-CoV-2 target nucleic acids are NOT DETECTED. The SARS-CoV-2 RNA is generally detectable in upper and lower respiratory specimens during the acute phase of infection. Negative results do not preclude SARS-CoV-2 infection, do not rule out co-infections with other pathogens, and should not be used as the sole basis for treatment or other patient management decisions. Negative results must be combined with clinical observations, patient history, and epidemiological information. The expected result is Negative. Fact Sheet for Patients: SugarRoll.be Fact Sheet for Healthcare Providers: https://www.woods-mathews.com/ This test is not yet approved or cleared by the [place] FDA and  has been authorized for detection and/or diagnosis of SARS-CoV-2 by FDA under an Emergency Use Authorization (EUA). This EUA will remain  in effect (meaning this test can be used) for the duration of the COVID-19 declaration under Section 56 4(b)(1) of the Act, 21 U.S.C. section 360bbb-3(b)(1), unless the authorization is terminated or revoked sooner. Performed at [facility], [place] [address], [place], [place] [address]     Please note: You were cared for by a hospitalist during your hospital stay. Once you are discharged, your primary care physician will handle any further medical issues. Please note that NO REFILLS for any discharge medications will be authorized once you are discharged, as it is imperative that you return to your primary care physician (or establish a relationship with a primary care physician if you do not have one) for your post hospital discharge needs so that they can reassess your need for medications and monitor your lab values.    Time coordinating discharge: 40 minutes  SIGNED:   Shelly Coss, MD  [facility] 01/24/2019, 1:23 PM Pager [phone]  If 7PM-7AM, please contact night-coverage [email or web address] Password [email or web address]

## 2021-03-13 DEATH — deceased

## 2021-06-13 IMAGING — MR MRI HEAD WITHOUT CONTRAST
6 series · 30 of 48 positions shown · non-contrast
Comparison: None.

CLINICAL DATA: 73 y/o  M; TIA, initial exam.

EXAM:
MRI HEAD WITHOUT CONTRAST
TECHNIQUE: Axial DWI, axial T2 FLAIR, axial T2 propeller, and axial SWI
sequences were acquired. Patient was unable to continue and
additional sequences were not acquired.

[Series 2: DWI · axial · 3.0mm · 0.94mm/px · z∈[-53,+88]mm · 9 of 96 slices shown]
[im 1/96]
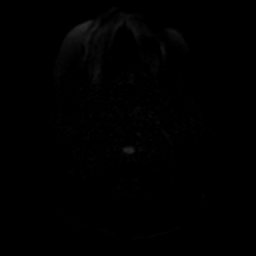
[im 18/96]
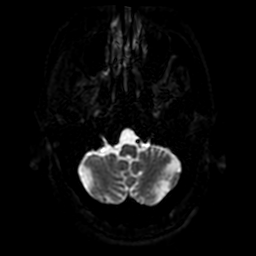
[im 26/96]
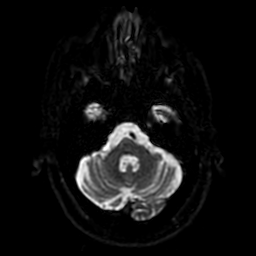
[im 44/96]
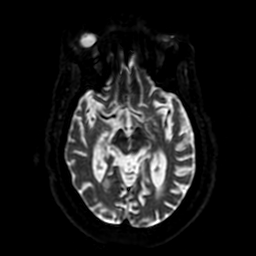
[im 52/96]
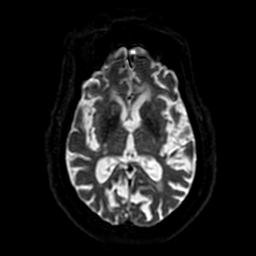
[im 70/96]
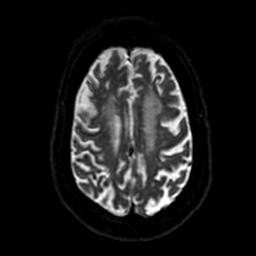
[im 78/96]
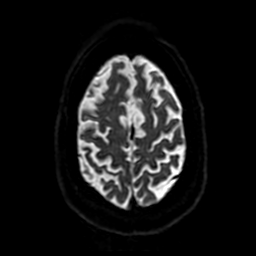
[im 87/96]
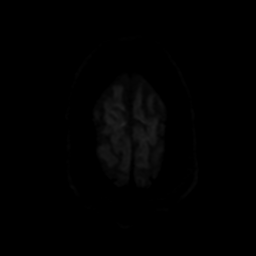
[im 96/96]
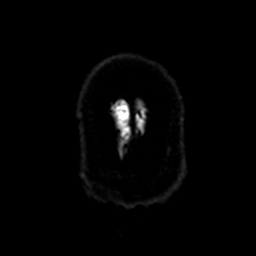

[Series 3: FLAIR · axial · 3.0mm · 0.47mm/px · z∈[-51,+86]mm · 3 of 24 slices shown]
[im 1/24]
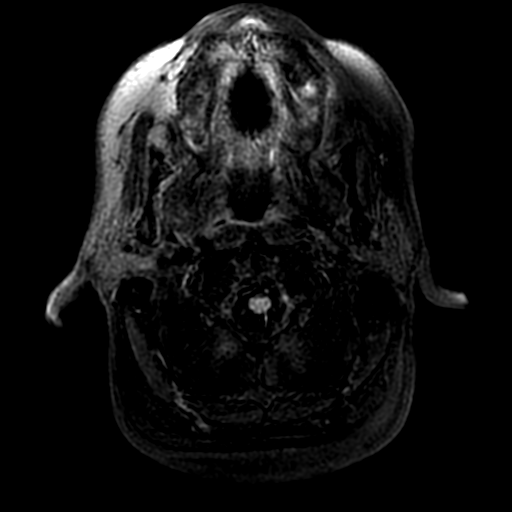
[im 12/24]
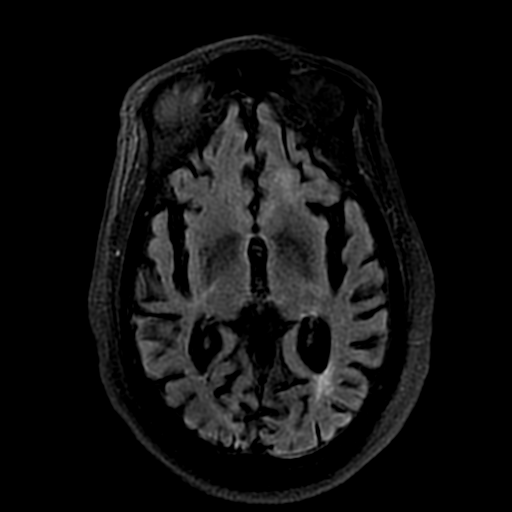
[im 24/24]
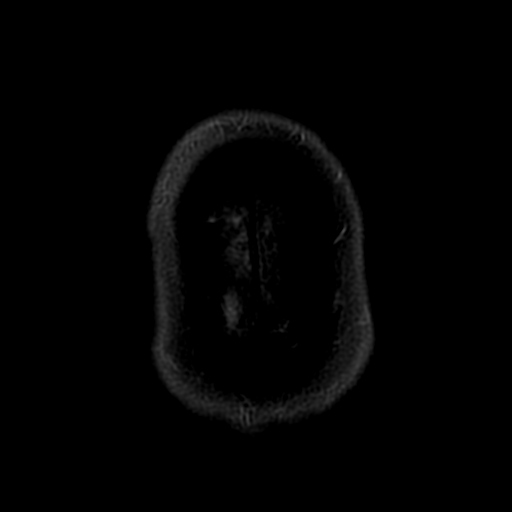

[Series 4: SWI · axial · 3.0mm · 0.47mm/px · z∈[-53,+89]mm · 8 of 96 slices shown (1 of 2)]
[im 1/96]
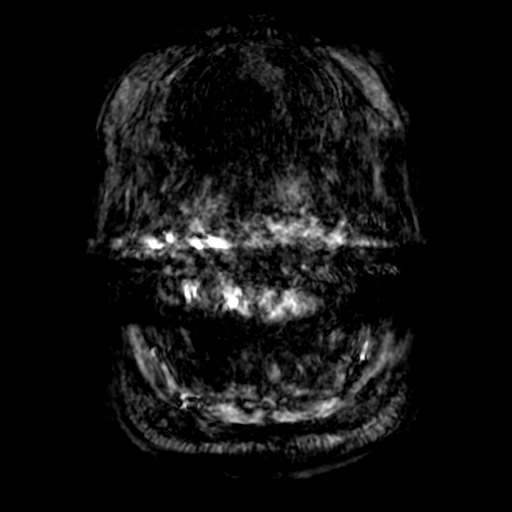
[im 18/96]
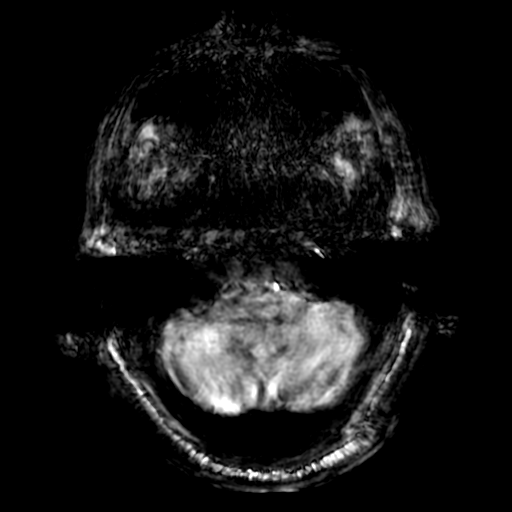
[im 26/96]
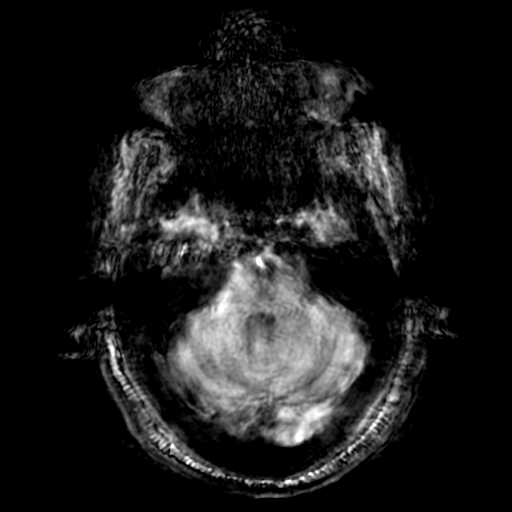
[im 44/96]
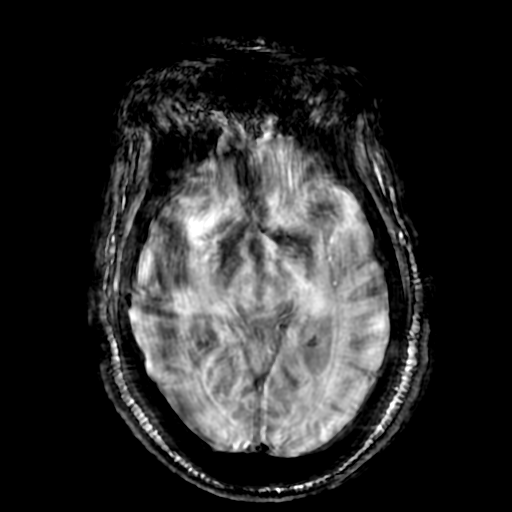
[im 52/96]
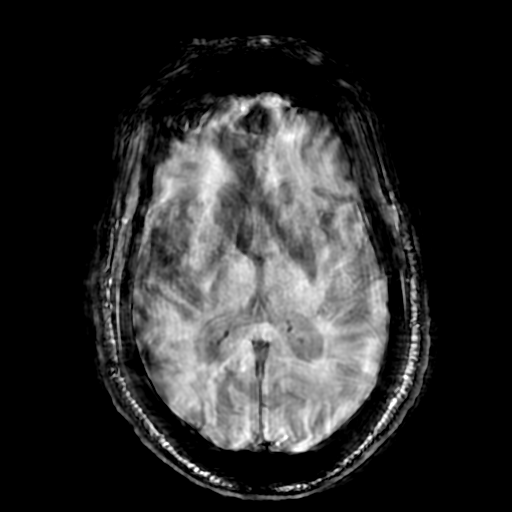
[im 70/96]
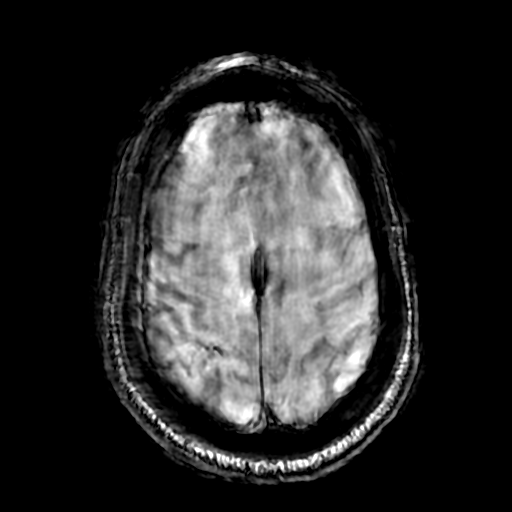
[im 78/96]
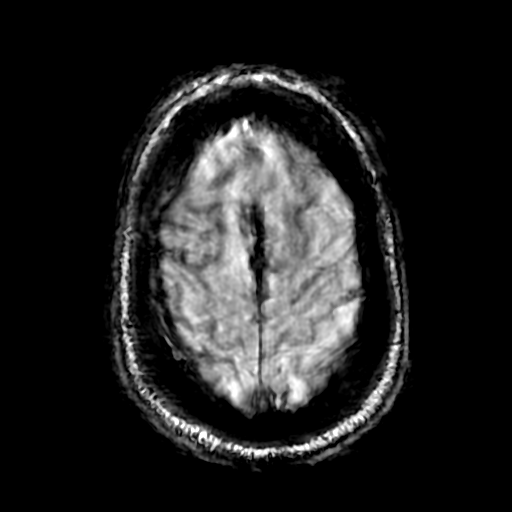
[im 96/96]
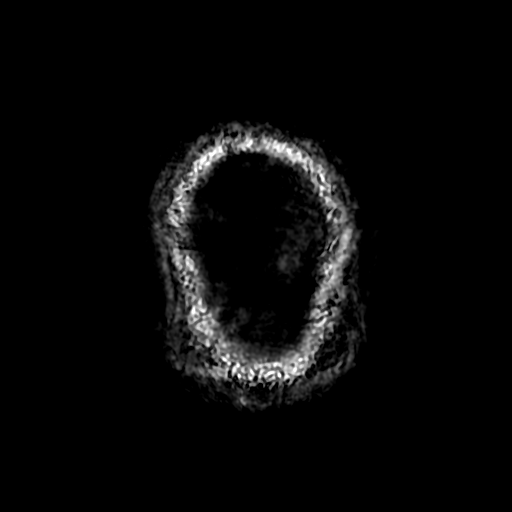

[Series 5: T2 · axial · 5.0mm · 0.47mm/px · z∈[-51,+86]mm · 3 of 24 slices shown]
[im 1/24]
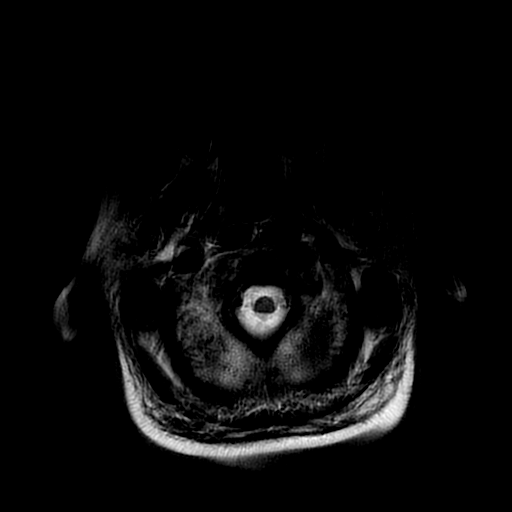
[im 12/24]
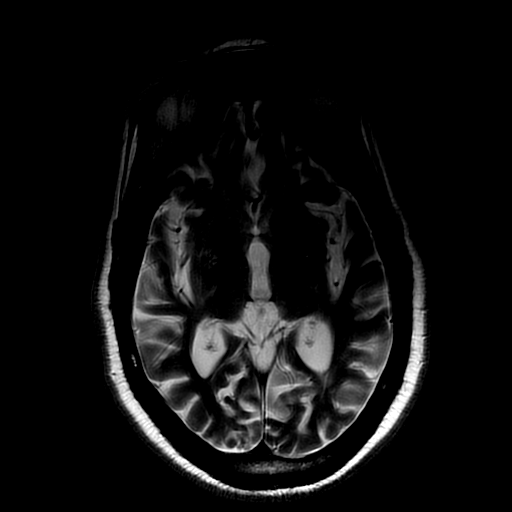
[im 24/24]
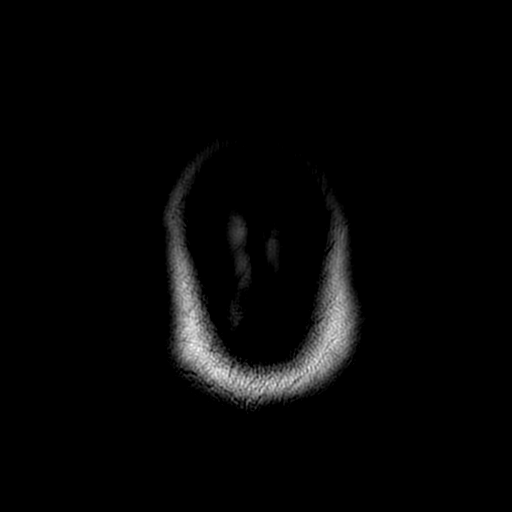

[Series 250: ADC · axial · 3.0mm · 0.94mm/px · z∈[-53,+88]mm · 6 of 48 slices shown]
[im 1/48]
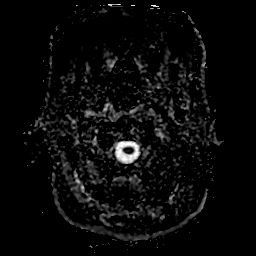
[im 10/48]
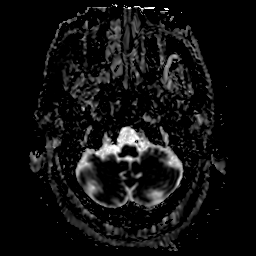
[im 19/48]
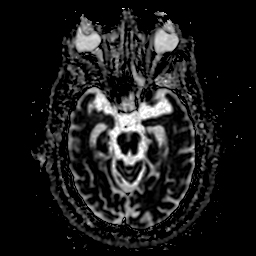
[im 29/48]
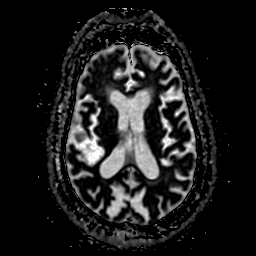
[im 38/48]
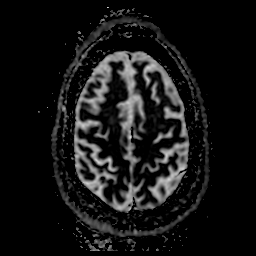
[im 48/48]
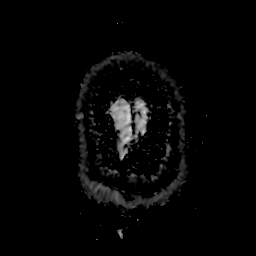

[Series 400: SWI · axial · 3.0mm · 0.47mm/px · 1 of 92 slices shown (2 of 2)]
[im 1/92]
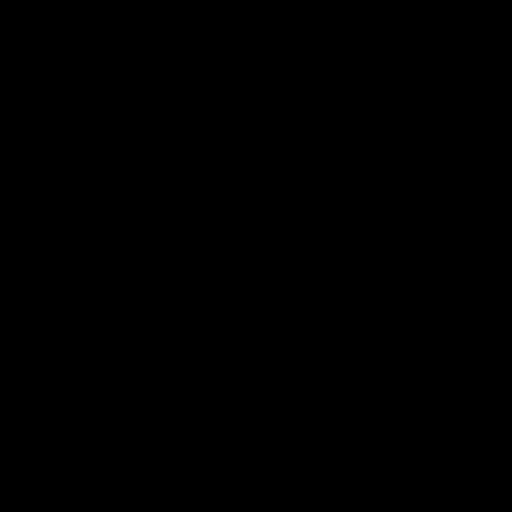

[30 of 48 positions shown; findings below may reference images not displayed]

FINDINGS: Brain: No reduced diffusion to suggest acute or early subacute
infarction. Nonspecific confluent foci of T2 FLAIR hyperintense
signal abnormality are present in subcortical and periventricular
white matter compatible with moderate to severe chronic
microvascular ischemic changes. There is moderate to severe volume
loss the brain. No focal mass effect, extra-axial collection,
hydrocephalus, or herniation identified. Motion degraded SWI
sequence.

Vascular: Normal flow voids.

Skull and upper cervical spine: Normal marrow signal.

Sinuses/Orbits: Negative.

Other: None.
IMPRESSION: Axial DWI, T2 FLAIR, T2, and SWI sequences were acquired. Motion
degraded SWI sequence.

1. No acute stroke, mass effect, or extra-axial collection
identified.
2. Moderate to severe chronic microvascular ischemic changes and
volume loss of the brain.
